# Patient Record
Sex: Male | Born: 1945 | Hispanic: No | Marital: Married | Smoking: Former smoker
Health system: Southern US, Community
[De-identification: ages and names within clinical notes are randomized; demographics above are authoritative.]

## PROBLEM LIST (undated history)

## (undated) DIAGNOSIS — I639 Cerebral infarction, unspecified: Secondary | ICD-10-CM

## (undated) DIAGNOSIS — I10 Essential (primary) hypertension: Secondary | ICD-10-CM

## (undated) DIAGNOSIS — K759 Inflammatory liver disease, unspecified: Secondary | ICD-10-CM

## (undated) DIAGNOSIS — K5900 Constipation, unspecified: Secondary | ICD-10-CM

## (undated) DIAGNOSIS — N186 End stage renal disease: Secondary | ICD-10-CM

## (undated) DIAGNOSIS — Z992 Dependence on renal dialysis: Secondary | ICD-10-CM

## (undated) DIAGNOSIS — I2510 Atherosclerotic heart disease of native coronary artery without angina pectoris: Secondary | ICD-10-CM

## (undated) DIAGNOSIS — F1010 Alcohol abuse, uncomplicated: Secondary | ICD-10-CM

## (undated) DIAGNOSIS — E7800 Pure hypercholesterolemia, unspecified: Secondary | ICD-10-CM

## (undated) DIAGNOSIS — K7460 Unspecified cirrhosis of liver: Secondary | ICD-10-CM

## (undated) HISTORY — PX: CATARACT EXTRACTION W/ INTRAOCULAR LENS IMPLANT: SHX1309

---

## 2003-07-22 ENCOUNTER — Ambulatory Visit: Admission: RE | Admit: 2003-07-22 | Discharge: 2003-07-22 | Payer: Self-pay | Admitting: *Deleted

## 2004-01-11 HISTORY — PX: AV FISTULA PLACEMENT: SHX1204

## 2004-03-12 ENCOUNTER — Encounter: Admission: RE | Admit: 2004-03-12 | Discharge: 2004-03-12 | Payer: Self-pay | Admitting: Nephrology

## 2004-03-24 ENCOUNTER — Ambulatory Visit (HOSPITAL_COMMUNITY): Admission: RE | Admit: 2004-03-24 | Discharge: 2004-03-24 | Payer: Self-pay | Admitting: Cardiology

## 2004-08-20 ENCOUNTER — Ambulatory Visit (HOSPITAL_COMMUNITY): Admission: RE | Admit: 2004-08-20 | Discharge: 2004-08-20 | Payer: Self-pay | Admitting: Vascular Surgery

## 2004-09-03 ENCOUNTER — Encounter (HOSPITAL_COMMUNITY): Admission: RE | Admit: 2004-09-03 | Discharge: 2004-12-02 | Payer: Self-pay | Admitting: Nephrology

## 2005-07-01 ENCOUNTER — Ambulatory Visit: Payer: Self-pay | Admitting: Cardiology

## 2005-07-25 ENCOUNTER — Ambulatory Visit: Payer: Self-pay | Admitting: Cardiology

## 2005-07-25 ENCOUNTER — Ambulatory Visit (HOSPITAL_COMMUNITY): Admission: RE | Admit: 2005-07-25 | Discharge: 2005-07-25 | Payer: Self-pay | Admitting: Cardiology

## 2005-08-12 ENCOUNTER — Ambulatory Visit: Payer: Self-pay | Admitting: Cardiology

## 2005-09-28 ENCOUNTER — Ambulatory Visit: Payer: Self-pay | Admitting: Gastroenterology

## 2005-10-03 ENCOUNTER — Ambulatory Visit (HOSPITAL_COMMUNITY): Admission: RE | Admit: 2005-10-03 | Discharge: 2005-10-03 | Payer: Self-pay | Admitting: Gastroenterology

## 2005-10-07 ENCOUNTER — Ambulatory Visit: Payer: Self-pay | Admitting: Gastroenterology

## 2006-05-01 ENCOUNTER — Ambulatory Visit: Payer: Self-pay

## 2006-05-01 ENCOUNTER — Ambulatory Visit: Payer: Self-pay | Admitting: Cardiology

## 2006-05-01 LAB — CONVERTED CEMR LAB
ALT: 22 units/L (ref 0–40)
AST: 24 units/L (ref 0–37)
Cholesterol: 179 mg/dL (ref 0–200)
Total Bilirubin: 0.8 mg/dL (ref 0.3–1.2)
Total CHOL/HDL Ratio: 5.9
VLDL: 44 mg/dL — ABNORMAL HIGH (ref 0–40)

## 2006-10-02 ENCOUNTER — Ambulatory Visit (HOSPITAL_COMMUNITY): Admission: RE | Admit: 2006-10-02 | Discharge: 2006-10-02 | Payer: Self-pay | Admitting: Nephrology

## 2006-11-27 ENCOUNTER — Ambulatory Visit (HOSPITAL_COMMUNITY): Admission: RE | Admit: 2006-11-27 | Discharge: 2006-11-27 | Payer: Self-pay | Admitting: Nephrology

## 2007-08-24 ENCOUNTER — Encounter: Admission: RE | Admit: 2007-08-24 | Discharge: 2007-08-24 | Payer: Self-pay | Admitting: Nephrology

## 2007-08-24 ENCOUNTER — Encounter: Payer: Self-pay | Admitting: Pulmonary Disease

## 2007-09-04 ENCOUNTER — Encounter: Payer: Self-pay | Admitting: Pulmonary Disease

## 2007-09-04 ENCOUNTER — Encounter: Admission: RE | Admit: 2007-09-04 | Discharge: 2007-09-04 | Payer: Self-pay | Admitting: Nephrology

## 2007-10-05 ENCOUNTER — Ambulatory Visit: Payer: Self-pay | Admitting: Pulmonary Disease

## 2007-10-05 DIAGNOSIS — I10 Essential (primary) hypertension: Secondary | ICD-10-CM

## 2007-10-05 DIAGNOSIS — J984 Other disorders of lung: Secondary | ICD-10-CM

## 2007-10-05 DIAGNOSIS — N19 Unspecified kidney failure: Secondary | ICD-10-CM | POA: Insufficient documentation

## 2007-10-05 DIAGNOSIS — N186 End stage renal disease: Secondary | ICD-10-CM

## 2007-10-05 DIAGNOSIS — E1122 Type 2 diabetes mellitus with diabetic chronic kidney disease: Secondary | ICD-10-CM

## 2007-10-05 DIAGNOSIS — I219 Acute myocardial infarction, unspecified: Secondary | ICD-10-CM | POA: Insufficient documentation

## 2008-10-01 DIAGNOSIS — I428 Other cardiomyopathies: Secondary | ICD-10-CM

## 2008-10-01 DIAGNOSIS — E785 Hyperlipidemia, unspecified: Secondary | ICD-10-CM | POA: Insufficient documentation

## 2008-10-01 DIAGNOSIS — I2510 Atherosclerotic heart disease of native coronary artery without angina pectoris: Secondary | ICD-10-CM | POA: Insufficient documentation

## 2008-10-01 DIAGNOSIS — B1710 Acute hepatitis C without hepatic coma: Secondary | ICD-10-CM | POA: Insufficient documentation

## 2008-10-01 DIAGNOSIS — I639 Cerebral infarction, unspecified: Secondary | ICD-10-CM

## 2008-10-06 ENCOUNTER — Ambulatory Visit (HOSPITAL_COMMUNITY): Admission: RE | Admit: 2008-10-06 | Discharge: 2008-10-06 | Payer: Self-pay | Admitting: Nephrology

## 2009-06-05 ENCOUNTER — Ambulatory Visit: Payer: Self-pay | Admitting: Pulmonary Disease

## 2009-06-10 ENCOUNTER — Ambulatory Visit: Payer: Self-pay | Admitting: Cardiology

## 2010-01-31 ENCOUNTER — Encounter: Payer: Self-pay | Admitting: Pulmonary Disease

## 2010-02-03 ENCOUNTER — Other Ambulatory Visit (HOSPITAL_COMMUNITY): Payer: Self-pay | Admitting: Internal Medicine

## 2010-02-03 ENCOUNTER — Other Ambulatory Visit (HOSPITAL_COMMUNITY): Payer: Self-pay | Admitting: Cardiology

## 2010-02-03 ENCOUNTER — Ambulatory Visit
Admission: RE | Admit: 2010-02-03 | Discharge: 2010-02-03 | Payer: Self-pay | Source: Home / Self Care | Attending: Vascular Surgery | Admitting: Vascular Surgery

## 2010-02-03 DIAGNOSIS — E049 Nontoxic goiter, unspecified: Secondary | ICD-10-CM

## 2010-02-03 DIAGNOSIS — R52 Pain, unspecified: Secondary | ICD-10-CM

## 2010-02-10 NOTE — Assessment & Plan Note (Signed)
Summary: rov for pulmonary nodules   Copy to:  Fayrene Fearing Deterding  CC:  Pt is here for a f/u appt to f/u on lung nodule.  Pt was last seen Sept 2009.  Pt denied sob, cough, tightness in chest, fever, and head congestion.  Marland Kitchen  History of Present Illness: the pt comes in today for f/u of his known pulmonary nodules. He was last seen in 2009 where a chest CT showed an 11 mm right lung density, and the decision was made to follow this with surveillance. He was scheduled for a four-month followup CT chest, however the patient no showed for the visit. Multiple attempts were made to contact the patient to reschedule, however they were unsuccessful. The patient was lost to followup, but now comes in today for further evaluation. He denies significant anorexia or weight loss, and has no significant cough.  He denies any significant change in his breathing.  Current Medications (verified): 1)  Plavix 75 Mg Tabs (Clopidogrel Bisulfate) .... Take 1 Tablet By Mouth Once A Day 2)  Lipitor 40 Mg Tabs (Atorvastatin Calcium) .... Take 1 Tablet By Mouth Once A Day 3)  Insulin 70/30 .... Use As Directed 4)  Coreg Cr 20 Mg Xr24h-Cap (Carvedilol Phosphate) .... Take 1 Tab By Mouth Each Morning and 1/2 Tab By Mouth At Bedtime 5)  Lisinopril 10 Mg Tabs (Lisinopril) .... Take 1 Tablet By Mouth Once A Day 6)  Rena-Vite  Tabs (B Complex-C-Folic Acid) .... Take 1 Tablet By Mouth Once A Day 7)  Calcium Acetate 667 Mg Caps (Calcium Acetate (Phos Binder)) .... Take 2 Tabs By Mouth With Each Meal and With Snacks 8)  Sensipar 30 Mg Tabs (Cinacalcet Hcl) .... Take 1 Tab By Mouth At Bedtime  Allergies (verified): No Known Drug Allergies  Review of Systems  The patient denies shortness of breath with activity, shortness of breath at rest, productive cough, non-productive cough, coughing up blood, chest pain, irregular heartbeats, acid heartburn, indigestion, loss of appetite, weight change, abdominal pain, difficulty swallowing,  sore throat, tooth/dental problems, headaches, nasal congestion/difficulty breathing through nose, sneezing, itching, ear ache, anxiety, depression, hand/feet swelling, joint stiffness or pain, rash, change in color of mucus, and fever.    Vital Signs:  Patient profile:   65 year old male Weight:      172 pounds O2 Sat:      99 % on Room air Temp:     97.7 degrees F oral Pulse rate:   72 / minute BP sitting:   124 / 66  (left arm) Cuff size:   regular  Vitals Entered By: Arman Filter LPN (Jun 05, 2009 10:19 AM)  O2 Flow:  Room air CC: Pt is here for a f/u appt to f/u on lung nodule.  Pt was last seen Sept 2009.  Pt denied sob, cough, tightness in chest, fever, head congestion.   Comments Medications reviewed with patient Arman Filter LPN  Jun 05, 2009 10:20 AM    Physical Exam  General:  wd male in nad Lungs:  clear Heart:  rrr Extremities:  mild edema, no cyanosis Neurologic:  alert and oriented, moves all 4.   Impression & Recommendations:  Problem # 1:  PULMONARY NODULE (ICD-518.89) the pt has known pulmonary nodules with tobacco abuse history.  He unfortunately has not had a f/u ct chest in almost 2 years.  He had a 4mos f/u scheduled, but no showed, and we were unable to contact him to arrange a followup.  He obviously needs a ct chest to check on the nodules in question.  I will call with results, and discuss further plans.  Medications Added to Medication List This Visit: 1)  Insulin 70/30  .... Use as directed 2)  Coreg Cr 20 Mg Xr24h-cap (Carvedilol phosphate) .... Take 1 tab by mouth each morning and 1/2 tab by mouth at bedtime 3)  Lisinopril 10 Mg Tabs (Lisinopril) .... Take 1 tablet by mouth once a day 4)  Rena-vite Tabs (B complex-c-folic acid) .... Take 1 tablet by mouth once a day 5)  Calcium Acetate 667 Mg Caps (Calcium acetate (phos binder)) .... Take 2 tabs by mouth with each meal and with snacks 6)  Sensipar 30 Mg Tabs (Cinacalcet hcl) .... Take 1 tab by  mouth at bedtime  Other Orders: Est. Patient Level III (04540) Radiology Referral (Radiology)  Patient Instructions: 1)  will set up for ct chest to evaluate your nodules.  I will call you with the results.   Immunization History:  Influenza Immunization History:    Influenza:  historical (01/11/2008)

## 2010-02-22 ENCOUNTER — Encounter (HOSPITAL_COMMUNITY): Payer: Self-pay

## 2010-03-23 ENCOUNTER — Encounter (HOSPITAL_COMMUNITY): Payer: Self-pay

## 2010-05-28 NOTE — Cardiovascular Report (Signed)
[name], [name]                 ACCOUNT NO.:  [id number]   MEDICAL RECORD NO.:  [id number]          PATIENT TYPE:  OIB   LOCATION:  2899                         FACILITY:  [facility]   PHYSICIAN:  Arturo Morton. Riley Kill, M.D. [facility] OF BIRTH:  11/08/1945   DATE OF PROCEDURE:  07/25/2005  DATE OF DISCHARGE:                              CARDIAC CATHETERIZATION   INDICATIONS:  The patient is a 65 year old gentleman with end-stage renal  disease on dialysis.  He is being considered for a transplant. An  echocardiogram in [place] suggested an EF of 45%. Previous Cardiolite here  showed an EF of 41%, with an inferior scar. Current study is done to assess  coronary anatomy.   PROCEDURES:  1.  Selective coronary arteriography.  2.  Left heart catheterization.   DESCRIPTION OF THE PROCEDURE:  The patient was brought to the cath lab and  prepped and draped in the usual fashion.  We used 4-French catheters.  We  did do an i-STAT which was 5.5, with the previous potassium of 5.9.  Using 4-  French catheters, we injected the coronaries.  The valve was crossed with  the right coronary catheter.  We elected not to do a  ventriculogram.  He  tolerated the procedure well without complication and was taken to the  holding area in satisfactory clinical condition.  I did make contact with  Dr. Annie Sable, who thought that dialysis tomorrow would be  satisfactory.   HEMODYNAMIC DATA:  1.  Central aortic pressure 157/69, mean 102.  2.  Left ventricular pressure 157/20.  3.  No gradient across the aortic valve.   ANGIOGRAPHIC DATA:  1.  On plain fluoroscopy, there was evidence of calcification of the      coronaries.  2.  The left main is free of critical disease.  3.  The left anterior descending artery has some luminal irregularities, but      noncritical stenosis.  There is a large diagonal branch with about 50%      narrowing proximally.  4.  The circumflex demonstrates a first marginal  without significant      narrowing, and then the circumflex is totally occluded.  There are      bridging collaterals to the distal vessel.  5.  The right coronary artery demonstrates 50% proximal narrowing.  The      vessel is then totally occluded after the takeoff of the RV branch.  The      distal vessel fills by collaterals.   CONCLUSIONS:  Coronary artery disease, with total occlusion of the  circumflex and right coronary arteries, and noncritical disease of the left  anterior descending artery.   DISPOSITION:  I have discussed the case with Dr. Jens Som. The patient is  currently asymptomatic.  At the present time, we would recommend medical  therapy.  The issue regarding candidacy for transplant is a more difficult  issue that will need to be discussed with Dr. Jens Som and also the  transplant surgical team.      Arturo Morton. Riley Kill, M.D. [facility]  Electronically Signed  TDS/MEDQ  D:  07/25/2005  T:  07/25/2005  Job:  [id number]   cc:   Cardiovascular Laboratory  Olga Millers, M.D. [facility]  Richard F. Caryn Section, M.D.

## 2010-05-28 NOTE — Assessment & Plan Note (Signed)
[facility] HEALTHCARE                            CARDIOLOGY OFFICE NOTE   Greg [name], Greg [name]                          MRN:          [id number]  DATE:05/01/2006                            DOB:          08-16-1945    Greg [name] is a gentleman who has a history of coronary disease based on  the cardiac catheterization on July 25, 2005.  We are treating him  medically.  Since I last saw him, he did travel to [place] for 4  months.  He apparently had a CVA while he was over there but those  records are not available.  He denies any dyspnea, chest pain,  palpitations or syncope.   MEDICATIONS:  1. PhosLo.  2. Insulin.  3. Nephro-Vite daily.  4. Toprol 12.5 mg p.o. daily.  5. Aspirin 325 mg p.o. daily.  6. Lipitor 40 mg p.o. daily.  7. Stool softener.  8. Plavix 75 mg p.o. daily.  9. Coreg CR 10 mg on Tuesdays, Thursday and Saturdays, which were his      dialysis days, and Coreg 20 mg CR on Mondays, Wednesdays, Fridays      and Sundays.   PHYSICAL EXAM TODAY:  Shows a blood pressure of 170/78 and his pulse is  66.  He weighs 168 pounds.  NECK:  Supple and there are bilateral carotid bruits.  CHEST:  Clear.  CARDIOVASCULAR EXAM:  Regular rate and rhythm.  EXTREMITIES:  No edema.   Electrocardiogram shows a sinus rhythm at a rate of 66.  There is left  ventricular hypertrophy and there is inferolateral T wave inversion.   DIAGNOSES:  1. Coronary artery disease - we will plan to continue with medical      therapy with his aspirin, Plavix, beta-blocker and Statin.  Note, I      will discontinue his Toprol as he is on both Toprol and Coreg.  2. Ischemic cardiomyopathy - He will continue with his beta-blocker.      Note, his blood pressure apparently dropped significantly on      dialysis and I am, therefore, hesitant to add an ACE inhibitor.  We      will continue to track this and add one in the future if tolerated.  3. Hypertension - His blood pressure is well  controlled on one reading      today.  However, this apparently is an issue at home, in particular      on dialysis days.  This will continue to be tracked and we will      adjust as indicated.  4. Diabetes mellitus - Per primary care.  5. Hyperlipidemia - We will check lipids and liver today and adjust      his Lipitor as indicated with a goal LDL of less than 70.  6. End-stage renal disease, dialysis dependent - Per Nephrology.  7. History of Hepatitis C.  8. Recent cerebrovascular accident - He has bilateral carotid bruits      and we will check carotid Dopplers.  We will see him back in 6  months.     Madolyn Frieze Greg Som, MD, [facility]  Electronically Signed    BSC/MedQ  DD: 05/01/2006  DT: 05/01/2006  Job #: [id number]

## 2010-05-28 NOTE — Assessment & Plan Note (Signed)
[facility] HEALTHCARE                           GASTROENTEROLOGY OFFICE NOTE   TREYSEAN, PETRUZZI                          MRN:          [id number]  DATE:09/28/2005                            DOB:          03-05-1945    REFERRING PHYSICIAN:  Wilber Bihari. Caryn Section, M.D.   REASON FOR REFERRAL:  Colorectal cancer screening.   HISTORY OF PRESENT ILLNESS:  Mr. Saraceno is a 65 year old male with end-stage  renal disease who has been on hemodialysis for about the past five months.  He is in the process of being referred to [facility] for  a possible kidney transplant.  Screening colonoscopy has been recommended  prior to that evaluation.  He has no history of gastrointestinal complaints  and specifically denies any change in bowel habits, diarrhea, constipation,  change in stool caliber, melena, or hematochezia.  There is no family  history of colon cancer, colon polyps, or inflammatory bowel disease.  Mr.  Darley states he obtained hepatitis C from a blood transfusion while he was  visiting in [place] in 2002.  He has not been evaluated by hepatologist for  treatment.  He has hemodialysis on Tuesday, Thursdays, and Saturdays.   PAST MEDICAL HISTORY:  1. Diabetes mellitus.  2. Hypertension.  3. Hyperlipidemia.  4. End-stage renal disease secondary to diabetes, on hemodialysis.  5. History of hepatitis C.  6. Coronary artery disease.   MEDICATIONS:  Listed on the chart, updated and reviewed.   MEDICATION ALLERGIES:  None known.   SOCIAL HISTORY:  Per the handwritten evaluation form.   REVIEW OF SYSTEMS:  Per the handwritten evaluation form.   PHYSICAL EXAMINATION:  GENERAL:  No acute distress.  Appears chronically  ill.  Height 5 feet 9 inches.  Weight 142 pounds.  VITAL SIGNS:  Blood pressure 90/56, pulse 56.  HEENT:  Anicteric sclerae.  Oropharynx clear.  CHEST:  Clear to auscultation bilaterally.  CARDIAC:  Regular rate and rhythm without  murmurs appreciated.  ABDOMEN:  Soft, nontender, nondistended.  Normoactive bowel sounds.  No  palpable organomegaly, masses, or hernias.  RECTAL:  Deferred at the time of colonoscopy.  EXTREMITIES:  Without clubbing, cyanosis or edema.  NEUROLOGIC:  Alert and oriented x3.  Grossly nonfocal.   ASSESSMENT/PLAN:  1. Average risk for colorectal cancer and colon polyps.  Insulin-dependent      diabetes mellitus and end-stage renal disease, on hemodialysis.  Risks,      benefits and alternatives to colonoscopy, possible biopsy and possible      polypectomy, were discussed with the patient, and he consents to      proceed. This will be scheduled electively.  2. History of hepatitis C:  I have recommended further evaluation with a      hepatologist, who specializes in treatment of hepatitis C.  This also      needs to be further discussed with his transplant surgeon at [facility].  Venita Lick. Russella Dar, MD, [facility]   MTS/MedQ  DD:  09/28/2005  DT:  09/29/2005  Job #:  [id number]   cc:   Wilber Bihari. Caryn Section, M.D.

## 2010-05-28 NOTE — Assessment & Plan Note (Signed)
[facility] HEALTHCARE                              CARDIOLOGY OFFICE NOTE   LOYCE, KLASEN                          MRN:          [id number]  DATE:08/12/2005                            DOB:          August 12, 1945    HISTORY:  Mr. Park Breed returns for followup today.  Please refer to my note of  July 01, 2005, for details.  I was asked to see him for possible evaluation  prior to consideration of renal transplant.  At that time we noted that he  had a previous abnormal nuclear study with an ejection fraction of 41% and a  prior inferior lateral infarction with mild ischemia in the septum.  Her  also had multiple risk factors.  We scheduled him for a cardiac  catheterization which was performed on July 25, 2005.  He had no disease in  his left main.  There was a diagonal with a 50% lesion but there was no  other disease in the LAD.  The circumflex gave rise to a first marginal  without significant narrowing, but then the circumflex was totally occluded,  but it did fill with bridging collaterals to the distal vessel.  The right  coronary artery had a 50% proximal lesion and then was totally occluded, but  also filled via the collaterals.  Since that time he has not had chest pain  or shortness of breath, and there is no pedal edema.   MEDICATIONS:  1.  PhosLo.  2.  Insulin.  3.  Nephro-Vite.  4.  Epogen.  5.  Coreg 20 mg p.o. daily.  6.  Aspirin 81 mg p.o. daily.  7.  Toprol 12.5 mg p.o. daily.   PHYSICAL EXAMINATION:  VITAL SIGNS:  Blood pressure 130/78, pulse 60.  NECK:  Supple, no bruits.  CHEST:  Clear.  CARDIOVASCULAR:  A regular rate and rhythm.  His right groin shows no  hematoma, no bruits.   Electrocardiogram shows a sinus rhythm and there is lateral T-wave inversion  which is unchanged.  There is also evidence of a prior inferior infarction.   DIAGNOSES:  1.  Coronary artery disease.  2.  History of mild reduction in left ventricular  function.  3.  History of hepatitis-C.  4.  Diabetes mellitus.  5.  Hypertension.  6.  Hyperlipidemia.  7.  End-stage renal disease, dialysis dependent.   PLAN:  Mr. Park Breed is doing well from the symptomatic standpoint.  We are  planning medical therapy for his coronary artery disease.  Note that  previous nuclear study performed in March 2006, did show an infarction and  only mild ischemia.  He is on two beta blockers and I have asked him to  discontinue his Toprol.  We will continue with his aspirin and Coreg and I  may increase his Coreg in the future.  Will check liver function tests  today.  If they are not significantly elevated from his hepatitis, then we  will add Zocor 40 mg p.o. q.h.s. and follow his lipids and liver closely.  If possible, I would like his  LDL to be less than 70, given his documented  coronary artery disease.  The issue of whether he will be a candidate for  renal transplant will need to be discussed with the nephrologist.  He does  have significant coronary disease and would be at higher risk than ordinary.  Apparently he has also been asked to see a gastroenterologist for his  history of hepatitis, and we will arrange that.  We will also consider  adding an ACE inhibitor in the future if his blood pressure will allow on  his dialysis days.  I will see him back in six months.                              Madolyn Frieze Jens Som, MD, [facility]    BSC/MedQ  DD:  08/12/2005  DT:  08/12/2005  Job #:  [id number]   cc:   Wilber Bihari. Caryn Section, MD

## 2010-05-28 NOTE — Op Note (Signed)
NAMEEVERHETT, BOZARD                 ACCOUNT NO.:  [id number]   MEDICAL RECORD NO.:  [id number]          PATIENT TYPE:  AMB   LOCATION:                               FACILITY:  [facility]   PHYSICIAN:  Larina Earthly, M.D.    DATE OF BIRTH:  1945-05-15   DATE OF PROCEDURE:  08/20/2004  DATE OF DISCHARGE:                                 OPERATIVE REPORT   PREOPERATIVE DIAGNOSIS:  Chronic renal insufficiency.   POSTOPERATIVE DIAGNOSIS:  Chronic renal insufficiency.   PROCEDURE:  Left upper arm arteriovenous fistula creation.   SURGEON:  Larina Earthly, M.D.   ASSISTANT:  Nurse.   ANESTHESIA:  MAC.   COMPLICATIONS:  None.   DISPOSITION:  To the recovery room stable.   DESCRIPTION OF PROCEDURE:  The patient was taken to the operating room and  placed in the supine position where the area of the left arm was prepped and  draped in the usual sterile fashion.  Using local anesthesia, an incision  was made over the antecubital space and carried down to isolate the cephalic  vein which was of good caliber and the brachial artery which was also of  good caliber.  The cephalic vein was mobilized proximally and distally.  The  vein was ligated distally and divided.  The vein was gently dilated with  heparinized saline and was found to be of good caliber.  The brachial artery  was occluded proximally and distally and was opened with a #11 blade and  sewn on with #3 Pott scissors.  The vein was slightly spatulated and sewn  end-to-side to the artery with a running #6-0 Prolene suture.  Clamps were  removed and an excellent thrill was noted.  The wounds were irrigated  saline.  Hemostasis with electrocautery.  The wounds were closed with #3-0  Vicryl in the subcutaneous and subcuticular tissue.  Benzoin and Steri-  Strips were applied.      Larina Earthly, M.D.  Electronically Signed     TFE/MEDQ  D:  08/20/2004  T:  08/20/2004  Job:  [id number]

## 2010-06-09 ENCOUNTER — Ambulatory Visit (INDEPENDENT_AMBULATORY_CARE_PROVIDER_SITE_OTHER): Payer: Medicaid Other | Admitting: Vascular Surgery

## 2010-06-09 DIAGNOSIS — I729 Aneurysm of unspecified site: Secondary | ICD-10-CM

## 2010-06-10 NOTE — Assessment & Plan Note (Signed)
OFFICE VISIT  JAEDIN, REGINA DOB:  [birth year]                                       06/09/2010 CHART#:[id number]  I saw the patient in the office today to evaluate his left upper arm AV fistula which was placed in 2006.  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  He was sent over to have this evaluated.  He dialyzes at [facility] Tuesdays, Thursdays and Saturdays.  He states that he has had this wound for approximately 3 weeks.  He has had no problems with bleeding from the fistula.  He has had no recent uremic symptoms.  Specifically, he denies nausea, vomiting, anorexia, fatigue, palpitations, shortness of breath.  PAST MEDICAL HISTORY:  Significant for stage V chronic kidney disease, diabetes, hypertension, and a previous history of myocardial infarction.  SOCIAL HISTORY:  He is married.  He quit tobacco in 2004.  FAMILY HISTORY:  There is no history of premature cardiovascular disease.  REVIEW OF SYSTEMS:  GENERAL:  He has had no recent weight loss, weight gain or problems with his appetite. CARDIOVASCULAR:  He has had no chest pain, chest pressure, palpitations or arrhythmias.  He has had no history of stroke or TIA.  He has had no history of DVT or phlebitis. GI, NEUROLOGIC, HEMATOLOGIC, GU, ENT, MUSCULOSKELETAL, PSYCHIATRIC: Unremarkable and is documented on the medical history form in his chart.  PHYSICAL EXAMINATION:  General:  This is a pleasant 65 year old gentleman who appears his stated age.  Blood pressure is 117/55, heart rate is 67, temperature is 98.2.  HEENT:  Unremarkable.  Lungs:  Clear bilaterally to auscultation without rales, rhonchi or wheezing. Cardiovascular Exam:  I do not detect any carotid bruits.  He has a regular rate and rhythm.  Abdomen:  Soft and nontender with normal pitched bowel sounds.   Musculoskeletal Exam:  There are no major deformities or cyanosis.  Neurologic Exam:  There is no focal weakness or paresthesias.  Extremities:  The left upper arm AV fistula is pulsatile.  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  I have reviewed his records from Dr. Deterding's office.  The patient also has a history of iron deficiency anemia and secondary hyperparathyroidism.  In addition, he has hepatitis C.  Of note, he had a 2D echo in April 2007 in [place] which showed an ejection fraction of 45% with aortic sclerosis.  Given the small eschar on the aneurysm, I think really the only way to try to salvage the fistula and address this would be a local incision and trying to possibly plicate the aneurysmal segment of vein and close the skin over this, if at all possible.  This will be tricky given that the skin is compromised here.  I am concerned that the fistula is pulsatile, and therefore, before proceeding with revision of his fistula, which I have scheduled for 06/18/10, I would like to perform a fistulogram to be sure there is no proximal central venous stenosis which needs to be addressed.  This has been scheduled for 06/14/10.  We have discussed the procedure and potential complications, and he is agreeable to proceed.  Of note, he dialyzes Tuesdays, Thursdays and Saturdays, so we are working around his dialysis schedule.    Di Kindle. Edilia Bo, M.D. Electronically Signed  CSD/MEDQ  D:  06/09/2010  T:  06/10/2010  Job:  [id number]  cc:   [facility]

## 2010-06-14 ENCOUNTER — Ambulatory Visit (HOSPITAL_COMMUNITY)
Admission: RE | Admit: 2010-06-14 | Discharge: 2010-06-14 | Disposition: A | Discharge status: Home / Self Care | Payer: Medicaid Other | Source: Ambulatory Visit | Attending: Vascular Surgery | Admitting: Vascular Surgery

## 2010-06-14 DIAGNOSIS — I120 Hypertensive chronic kidney disease with stage 5 chronic kidney disease or end stage renal disease: Secondary | ICD-10-CM

## 2010-06-14 DIAGNOSIS — T82898A Other specified complication of vascular prosthetic devices, implants and grafts, initial encounter: Secondary | ICD-10-CM

## 2010-06-14 DIAGNOSIS — Y832 Surgical operation with anastomosis, bypass or graft as the cause of abnormal reaction of the patient, or of later complication, without mention of misadventure at the time of the procedure: Secondary | ICD-10-CM | POA: Insufficient documentation

## 2010-06-14 DIAGNOSIS — D509 Iron deficiency anemia, unspecified: Secondary | ICD-10-CM | POA: Insufficient documentation

## 2010-06-14 DIAGNOSIS — N186 End stage renal disease: Secondary | ICD-10-CM | POA: Insufficient documentation

## 2010-06-14 DIAGNOSIS — B1920 Unspecified viral hepatitis C without hepatic coma: Secondary | ICD-10-CM | POA: Insufficient documentation

## 2010-06-14 DIAGNOSIS — I252 Old myocardial infarction: Secondary | ICD-10-CM | POA: Insufficient documentation

## 2010-06-14 DIAGNOSIS — N2581 Secondary hyperparathyroidism of renal origin: Secondary | ICD-10-CM | POA: Insufficient documentation

## 2010-06-14 DIAGNOSIS — E119 Type 2 diabetes mellitus without complications: Secondary | ICD-10-CM | POA: Insufficient documentation

## 2010-06-14 LAB — POCT I-STAT, CHEM 8
BUN: 34 mg/dL — ABNORMAL HIGH (ref 6–23)
Calcium, Ion: 1.08 mmol/L — ABNORMAL LOW (ref 1.12–1.32)
Chloride: 103 mEq/L (ref 96–112)
Creatinine, Ser: 6.2 mg/dL — ABNORMAL HIGH (ref 0.4–1.5)
Glucose, Bld: 100 mg/dL — ABNORMAL HIGH (ref 70–99)
HCT: 39 % (ref 39.0–52.0)

## 2010-06-14 LAB — GLUCOSE, CAPILLARY: Glucose-Capillary: 104 mg/dL — ABNORMAL HIGH (ref 70–99)

## 2010-06-17 NOTE — Op Note (Signed)
  [name], Greg [name]                 ACCOUNT NO.:  [id number]  MEDICAL RECORD NO.:  [id number]  LOCATION:                                 FACILITY:  PHYSICIAN:  Di Kindle. Edilia Bo, M.D.DATE OF BIRTH:  1945-05-12  DATE OF PROCEDURE: DATE OF DISCHARGE:                              OPERATIVE REPORT   PREOPERATIVE DIAGNOSIS:  Eschar of left arteriovenous fistula.  POSTOPERATIVE DIAGNOSIS:  Eschar of left arteriovenous fistula.  PROCEDURE: 1. Ultrasound-guided access to the left AV fistula. 2. Fistulogram. 3. PTA of the cephalic vein with a 5-mm x 2-cm balloon and then a 6-mm     x 2-cm balloon in 2areas.  SURGEON:  Di Kindle. Edilia Bo, MD.  ANESTHESIA:  Local with sedation.  TECHNIQUE:  The patient was taken to the PV lab and the left upper extremity was prepped and draped in the usual sterile fashion.  Under ultrasound guidance after the skin was anesthetized, the left AV fistula was cannulated with a micropuncture set and the micropuncture sheath introduced.  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  I elected to balloon these 2 areas.  The micropuncture sheath was exchanged for a short 5-French sheath.  The patient was then heparinized with 3000 units of IV heparin.  ACT was 217.  Next, a 5-mm x 2-cm balloon was selected and this was positioned across the cephalic vein stenosis just beyond the entrance into the subclavian vein, this was ballooned to 10 atmospheres.  Balloon appeared somewhat small, although for the size of the vein.  I also ballooned the area in the mid cephalic vein above the aneurysm again the balloon appeared somewhat small for the size of the vein here.  I had elected to use a larger balloon, I selected a 6-mm x 2-cm balloon and  again these 2 areas were ballooned at 10 atmospheres.  Completion film showed some mild improvement in the mid cephalic vein, it was difficult to determine if there was significant change in the distal cephalic vein.  I was reluctant to use a larger balloon and potentially risk injuring the fistula.  At the completion, he did require labetalol and hydralazine for his blood pressure.  A stitch was placed with a 4-0 Monocryl for hemostasis and pressure was held for 5 minutes.  No immediate complications were noted.     Di Kindle. Edilia Bo, M.D.     CSD/MEDQ  D:  06/14/2010  T:  06/15/2010  Job:  [id number]  cc:   [facility]  Electronically Signed by Waverly Ferrari M.D. on 06/17/2010 03:16:07 PM

## 2010-06-18 ENCOUNTER — Ambulatory Visit (HOSPITAL_COMMUNITY): Admission: RE | Admit: 2010-06-18 | Payer: Medicaid Other | Source: Ambulatory Visit | Admitting: Vascular Surgery

## 2010-07-21 ENCOUNTER — Ambulatory Visit: Payer: Medicaid Other | Admitting: Vascular Surgery

## 2010-08-11 ENCOUNTER — Inpatient Hospital Stay (HOSPITAL_COMMUNITY)
Admission: EM | Admit: 2010-08-11 | Discharge: 2010-08-13 | DRG: 064 | Disposition: A | Discharge status: Rehabilitation Facility | Payer: Medicaid Other | Attending: Neurology | Admitting: Neurology

## 2010-08-11 ENCOUNTER — Emergency Department (HOSPITAL_COMMUNITY): Payer: Medicaid Other

## 2010-08-11 DIAGNOSIS — D649 Anemia, unspecified: Secondary | ICD-10-CM | POA: Diagnosis present

## 2010-08-11 DIAGNOSIS — E1142 Type 2 diabetes mellitus with diabetic polyneuropathy: Secondary | ICD-10-CM | POA: Diagnosis present

## 2010-08-11 DIAGNOSIS — N2581 Secondary hyperparathyroidism of renal origin: Secondary | ICD-10-CM | POA: Diagnosis present

## 2010-08-11 DIAGNOSIS — E1149 Type 2 diabetes mellitus with other diabetic neurological complication: Secondary | ICD-10-CM | POA: Diagnosis present

## 2010-08-11 DIAGNOSIS — N186 End stage renal disease: Secondary | ICD-10-CM | POA: Diagnosis present

## 2010-08-11 DIAGNOSIS — B1920 Unspecified viral hepatitis C without hepatic coma: Secondary | ICD-10-CM | POA: Diagnosis present

## 2010-08-11 DIAGNOSIS — R209 Unspecified disturbances of skin sensation: Secondary | ICD-10-CM | POA: Diagnosis present

## 2010-08-11 DIAGNOSIS — Z9849 Cataract extraction status, unspecified eye: Secondary | ICD-10-CM

## 2010-08-11 DIAGNOSIS — I428 Other cardiomyopathies: Secondary | ICD-10-CM | POA: Diagnosis present

## 2010-08-11 DIAGNOSIS — E785 Hyperlipidemia, unspecified: Secondary | ICD-10-CM | POA: Diagnosis present

## 2010-08-11 DIAGNOSIS — Z794 Long term (current) use of insulin: Secondary | ICD-10-CM

## 2010-08-11 DIAGNOSIS — R471 Dysarthria and anarthria: Secondary | ICD-10-CM | POA: Diagnosis present

## 2010-08-11 DIAGNOSIS — E669 Obesity, unspecified: Secondary | ICD-10-CM | POA: Diagnosis present

## 2010-08-11 DIAGNOSIS — K219 Gastro-esophageal reflux disease without esophagitis: Secondary | ICD-10-CM | POA: Diagnosis present

## 2010-08-11 DIAGNOSIS — Z79899 Other long term (current) drug therapy: Secondary | ICD-10-CM

## 2010-08-11 DIAGNOSIS — I252 Old myocardial infarction: Secondary | ICD-10-CM

## 2010-08-11 DIAGNOSIS — Z8673 Personal history of transient ischemic attack (TIA), and cerebral infarction without residual deficits: Secondary | ICD-10-CM

## 2010-08-11 DIAGNOSIS — I120 Hypertensive chronic kidney disease with stage 5 chronic kidney disease or end stage renal disease: Secondary | ICD-10-CM | POA: Diagnosis present

## 2010-08-11 DIAGNOSIS — Z7982 Long term (current) use of aspirin: Secondary | ICD-10-CM

## 2010-08-11 DIAGNOSIS — I6350 Cerebral infarction due to unspecified occlusion or stenosis of unspecified cerebral artery: Principal | ICD-10-CM | POA: Diagnosis present

## 2010-08-11 DIAGNOSIS — R269 Unspecified abnormalities of gait and mobility: Secondary | ICD-10-CM | POA: Diagnosis present

## 2010-08-11 LAB — COMPREHENSIVE METABOLIC PANEL
ALT: 19 U/L (ref 0–53)
Alkaline Phosphatase: 62 U/L (ref 39–117)
BUN: 31 mg/dL — ABNORMAL HIGH (ref 6–23)
CO2: 33 mEq/L — ABNORMAL HIGH (ref 19–32)
Chloride: 92 mEq/L — ABNORMAL LOW (ref 96–112)
GFR calc Af Amer: 13 mL/min — ABNORMAL LOW (ref >60)
Glucose, Bld: 168 mg/dL — ABNORMAL HIGH (ref 70–99)
Potassium: 5 mEq/L (ref 3.5–5.1)
Sodium: 135 mEq/L (ref 135–145)
Total Bilirubin: 0.4 mg/dL (ref 0.3–1.2)

## 2010-08-11 LAB — PROTIME-INR: Prothrombin Time: 14.2 seconds (ref 11.6–15.2)

## 2010-08-11 LAB — POCT I-STAT, CHEM 8
BUN: 31 mg/dL — ABNORMAL HIGH (ref 6–23)
Creatinine, Ser: 5.8 mg/dL — ABNORMAL HIGH (ref 0.50–1.35)
Glucose, Bld: 170 mg/dL — ABNORMAL HIGH (ref 70–99)
Hemoglobin: 12.9 g/dL — ABNORMAL LOW (ref 13.0–17.0)
Potassium: 5 mEq/L (ref 3.5–5.1)

## 2010-08-11 LAB — CBC
HCT: 36.8 % — ABNORMAL LOW (ref 39.0–52.0)
MCH: 28.5 pg (ref 26.0–34.0)
MCHC: 33.4 g/dL (ref 30.0–36.0)
MCV: 85.2 fL (ref 78.0–100.0)
RDW: 14 % (ref 11.5–15.5)

## 2010-08-11 LAB — DIFFERENTIAL
Basophils Absolute: 0 10*3/uL (ref 0.0–0.1)
Eosinophils Relative: 1 % (ref 0–5)
Lymphocytes Relative: 23 % (ref 12–46)
Monocytes Absolute: 0.7 10*3/uL (ref 0.1–1.0)
Monocytes Relative: 10 % (ref 3–12)

## 2010-08-11 LAB — GLUCOSE, CAPILLARY

## 2010-08-11 LAB — CK TOTAL AND CKMB (NOT AT ARMC): CK, MB: 2.5 ng/mL (ref 0.3–4.0)

## 2010-08-12 ENCOUNTER — Inpatient Hospital Stay (HOSPITAL_COMMUNITY): Payer: Medicaid Other

## 2010-08-12 DIAGNOSIS — G8110 Spastic hemiplegia affecting unspecified side: Secondary | ICD-10-CM

## 2010-08-12 DIAGNOSIS — I69921 Dysphasia following unspecified cerebrovascular disease: Secondary | ICD-10-CM

## 2010-08-12 DIAGNOSIS — I6330 Cerebral infarction due to thrombosis of unspecified cerebral artery: Secondary | ICD-10-CM

## 2010-08-12 LAB — RENAL FUNCTION PANEL
CO2: 27 mEq/L (ref 19–32)
Calcium: 9.6 mg/dL (ref 8.4–10.5)
GFR calc Af Amer: 10 mL/min — ABNORMAL LOW (ref >60)
GFR calc non Af Amer: 8 mL/min — ABNORMAL LOW (ref >60)
Phosphorus: 5.1 mg/dL — ABNORMAL HIGH (ref 2.3–4.6)
Sodium: 133 mEq/L — ABNORMAL LOW (ref 135–145)

## 2010-08-12 LAB — CBC
MCH: 29.6 pg (ref 26.0–34.0)
Platelets: 105 10*3/uL — ABNORMAL LOW (ref 150–400)
RBC: 3.85 MIL/uL — ABNORMAL LOW (ref 4.22–5.81)

## 2010-08-12 LAB — LIPID PANEL
HDL: 38 mg/dL — ABNORMAL LOW (ref >39)
LDL Cholesterol: 135 mg/dL — ABNORMAL HIGH (ref 0–99)
Total CHOL/HDL Ratio: 5.3 RATIO
Triglycerides: 146 mg/dL (ref <150)

## 2010-08-12 LAB — GLUCOSE, CAPILLARY: Glucose-Capillary: 101 mg/dL — ABNORMAL HIGH (ref 70–99)

## 2010-08-13 ENCOUNTER — Inpatient Hospital Stay (HOSPITAL_COMMUNITY)
Admission: RE | Admit: 2010-08-13 | Discharge: 2010-08-26 | DRG: 945 | Disposition: A | Discharge status: Home Health | Payer: Medicaid Other | Source: Other Acute Inpatient Hospital | Attending: Physical Medicine & Rehabilitation | Admitting: Physical Medicine & Rehabilitation

## 2010-08-13 DIAGNOSIS — N186 End stage renal disease: Secondary | ICD-10-CM

## 2010-08-13 DIAGNOSIS — I6350 Cerebral infarction due to unspecified occlusion or stenosis of unspecified cerebral artery: Secondary | ICD-10-CM | POA: Diagnosis present

## 2010-08-13 DIAGNOSIS — K5900 Constipation, unspecified: Secondary | ICD-10-CM | POA: Diagnosis present

## 2010-08-13 DIAGNOSIS — I10 Essential (primary) hypertension: Secondary | ICD-10-CM

## 2010-08-13 DIAGNOSIS — E785 Hyperlipidemia, unspecified: Secondary | ICD-10-CM | POA: Diagnosis present

## 2010-08-13 DIAGNOSIS — Z87891 Personal history of nicotine dependence: Secondary | ICD-10-CM

## 2010-08-13 DIAGNOSIS — I059 Rheumatic mitral valve disease, unspecified: Secondary | ICD-10-CM | POA: Diagnosis present

## 2010-08-13 DIAGNOSIS — E119 Type 2 diabetes mellitus without complications: Secondary | ICD-10-CM | POA: Diagnosis present

## 2010-08-13 DIAGNOSIS — R471 Dysarthria and anarthria: Secondary | ICD-10-CM | POA: Diagnosis present

## 2010-08-13 DIAGNOSIS — I120 Hypertensive chronic kidney disease with stage 5 chronic kidney disease or end stage renal disease: Secondary | ICD-10-CM | POA: Diagnosis present

## 2010-08-13 DIAGNOSIS — Z7902 Long term (current) use of antithrombotics/antiplatelets: Secondary | ICD-10-CM

## 2010-08-13 DIAGNOSIS — I252 Old myocardial infarction: Secondary | ICD-10-CM

## 2010-08-13 DIAGNOSIS — G8190 Hemiplegia, unspecified affecting unspecified side: Secondary | ICD-10-CM | POA: Diagnosis present

## 2010-08-13 DIAGNOSIS — I951 Orthostatic hypotension: Secondary | ICD-10-CM | POA: Diagnosis not present

## 2010-08-13 DIAGNOSIS — R4789 Other speech disturbances: Secondary | ICD-10-CM | POA: Diagnosis present

## 2010-08-13 DIAGNOSIS — I2510 Atherosclerotic heart disease of native coronary artery without angina pectoris: Secondary | ICD-10-CM

## 2010-08-13 DIAGNOSIS — I6330 Cerebral infarction due to thrombosis of unspecified cerebral artery: Secondary | ICD-10-CM

## 2010-08-13 DIAGNOSIS — D649 Anemia, unspecified: Secondary | ICD-10-CM | POA: Diagnosis present

## 2010-08-13 DIAGNOSIS — B1920 Unspecified viral hepatitis C without hepatic coma: Secondary | ICD-10-CM | POA: Diagnosis present

## 2010-08-13 DIAGNOSIS — N2581 Secondary hyperparathyroidism of renal origin: Secondary | ICD-10-CM | POA: Diagnosis present

## 2010-08-13 DIAGNOSIS — E669 Obesity, unspecified: Secondary | ICD-10-CM | POA: Diagnosis present

## 2010-08-13 DIAGNOSIS — Z992 Dependence on renal dialysis: Secondary | ICD-10-CM

## 2010-08-13 DIAGNOSIS — G609 Hereditary and idiopathic neuropathy, unspecified: Secondary | ICD-10-CM | POA: Diagnosis present

## 2010-08-13 DIAGNOSIS — Z5189 Encounter for other specified aftercare: Principal | ICD-10-CM

## 2010-08-13 LAB — GLUCOSE, CAPILLARY
Glucose-Capillary: 147 mg/dL — ABNORMAL HIGH (ref 70–99)
Glucose-Capillary: 131 mg/dL — ABNORMAL HIGH (ref 70–99)

## 2010-08-14 ENCOUNTER — Inpatient Hospital Stay (HOSPITAL_COMMUNITY): Payer: Medicaid Other

## 2010-08-14 DIAGNOSIS — N186 End stage renal disease: Secondary | ICD-10-CM

## 2010-08-14 LAB — CBC
Hemoglobin: 11.3 g/dL — ABNORMAL LOW (ref 13.0–17.0)
MCH: 28.5 pg (ref 26.0–34.0)
MCHC: 33.9 g/dL (ref 30.0–36.0)
MCV: 83.9 fL (ref 78.0–100.0)
Platelets: 105 10*3/uL — ABNORMAL LOW (ref 150–400)

## 2010-08-14 LAB — RENAL FUNCTION PANEL
Calcium: 9.6 mg/dL (ref 8.4–10.5)
Creatinine, Ser: 5.95 mg/dL — ABNORMAL HIGH (ref 0.50–1.35)
GFR calc Af Amer: 12 mL/min — ABNORMAL LOW (ref >60)
Glucose, Bld: 171 mg/dL — ABNORMAL HIGH (ref 70–99)
Phosphorus: 4.6 mg/dL (ref 2.3–4.6)
Sodium: 133 mEq/L — ABNORMAL LOW (ref 135–145)

## 2010-08-14 LAB — GLUCOSE, CAPILLARY
Glucose-Capillary: 104 mg/dL — ABNORMAL HIGH (ref 70–99)
Glucose-Capillary: 154 mg/dL — ABNORMAL HIGH (ref 70–99)

## 2010-08-15 LAB — GLUCOSE, CAPILLARY
Glucose-Capillary: 154 mg/dL — ABNORMAL HIGH (ref 70–99)
Glucose-Capillary: 158 mg/dL — ABNORMAL HIGH (ref 70–99)
Glucose-Capillary: 220 mg/dL — ABNORMAL HIGH (ref 70–99)

## 2010-08-16 ENCOUNTER — Inpatient Hospital Stay (HOSPITAL_COMMUNITY): Payer: Medicaid Other

## 2010-08-16 DIAGNOSIS — I10 Essential (primary) hypertension: Secondary | ICD-10-CM

## 2010-08-16 DIAGNOSIS — I2510 Atherosclerotic heart disease of native coronary artery without angina pectoris: Secondary | ICD-10-CM

## 2010-08-16 DIAGNOSIS — N186 End stage renal disease: Secondary | ICD-10-CM

## 2010-08-16 DIAGNOSIS — I6330 Cerebral infarction due to thrombosis of unspecified cerebral artery: Secondary | ICD-10-CM

## 2010-08-16 LAB — GLUCOSE, CAPILLARY
Glucose-Capillary: 103 mg/dL — ABNORMAL HIGH (ref 70–99)
Glucose-Capillary: 178 mg/dL — ABNORMAL HIGH (ref 70–99)
Glucose-Capillary: 108 mg/dL — ABNORMAL HIGH (ref 70–99)

## 2010-08-17 ENCOUNTER — Inpatient Hospital Stay (HOSPITAL_COMMUNITY): Payer: Medicaid Other

## 2010-08-17 LAB — RENAL FUNCTION PANEL
Albumin: 3 g/dL — ABNORMAL LOW (ref 3.5–5.2)
BUN: 53 mg/dL — ABNORMAL HIGH (ref 6–23)
CO2: 25 mEq/L (ref 19–32)
Chloride: 91 mEq/L — ABNORMAL LOW (ref 96–112)
Creatinine, Ser: 6.49 mg/dL — ABNORMAL HIGH (ref 0.50–1.35)
Glucose, Bld: 131 mg/dL — ABNORMAL HIGH (ref 70–99)
Potassium: 5.7 mEq/L — ABNORMAL HIGH (ref 3.5–5.1)

## 2010-08-17 LAB — GLUCOSE, CAPILLARY
Glucose-Capillary: 104 mg/dL — ABNORMAL HIGH (ref 70–99)
Glucose-Capillary: 84 mg/dL (ref 70–99)

## 2010-08-17 LAB — CBC
HCT: 33.2 % — ABNORMAL LOW (ref 39.0–52.0)
Hemoglobin: 11.5 g/dL — ABNORMAL LOW (ref 13.0–17.0)
MCV: 83.4 fL (ref 78.0–100.0)
RBC: 3.98 MIL/uL — ABNORMAL LOW (ref 4.22–5.81)
RDW: 13.6 % (ref 11.5–15.5)
WBC: 10.5 10*3/uL (ref 4.0–10.5)

## 2010-08-18 DIAGNOSIS — N186 End stage renal disease: Secondary | ICD-10-CM

## 2010-08-18 DIAGNOSIS — I2510 Atherosclerotic heart disease of native coronary artery without angina pectoris: Secondary | ICD-10-CM

## 2010-08-18 DIAGNOSIS — I10 Essential (primary) hypertension: Secondary | ICD-10-CM

## 2010-08-18 DIAGNOSIS — I6330 Cerebral infarction due to thrombosis of unspecified cerebral artery: Secondary | ICD-10-CM

## 2010-08-18 LAB — GLUCOSE, CAPILLARY
Glucose-Capillary: 123 mg/dL — ABNORMAL HIGH (ref 70–99)
Glucose-Capillary: 101 mg/dL — ABNORMAL HIGH (ref 70–99)

## 2010-08-19 ENCOUNTER — Inpatient Hospital Stay (HOSPITAL_COMMUNITY): Payer: Medicaid Other

## 2010-08-19 LAB — RENAL FUNCTION PANEL
Calcium: 9.1 mg/dL (ref 8.4–10.5)
GFR calc Af Amer: 11 mL/min — ABNORMAL LOW (ref >60)
Glucose, Bld: 165 mg/dL — ABNORMAL HIGH (ref 70–99)
Phosphorus: 3.5 mg/dL (ref 2.3–4.6)
Sodium: 135 mEq/L (ref 135–145)

## 2010-08-19 LAB — CBC
HCT: 30.3 % — ABNORMAL LOW (ref 39.0–52.0)
MCH: 28.6 pg (ref 26.0–34.0)
MCHC: 33.7 g/dL (ref 30.0–36.0)
MCV: 84.9 fL (ref 78.0–100.0)
RDW: 13.8 % (ref 11.5–15.5)

## 2010-08-19 LAB — IRON AND TIBC: Iron: 53 ug/dL (ref 42–135)

## 2010-08-19 LAB — GLUCOSE, CAPILLARY: Glucose-Capillary: 103 mg/dL — ABNORMAL HIGH (ref 70–99)

## 2010-08-19 NOTE — Discharge Summary (Signed)
Greg, [name]                 ACCOUNT NO.:  [id number]  MEDICAL RECORD NO.:  [id number]  LOCATION:  3009                         FACILITY:  [facility]  PHYSICIAN:  Jaramie Bastos P. Pearlean Brownie, MD    DATE OF BIRTH:  12-Mar-1945  DATE OF ADMISSION:  08/11/2010 DATE OF DISCHARGE:  08/13/2010                              DISCHARGE SUMMARY   DIAGNOSES AT TIME OF DISCHARGE: 1. 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 2. Remote lacune due to extensive small vessel disease. 3. 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 4. Diabetes. 5. Dyslipidemia. 6. Hypertension. 7. Obesity. 8. Gait disorder. 9. Diabetic peripheral neuropathy. 10.Myocardial infarction in the past. 11.Hepatitis C. 12.Bilateral cataract surgery with lens implants. 13.End-stage renal disease, on hemodialysis Tuesday, Thursday, and     Saturday.  MEDICINES AT TIME OF DISCHARGE: 1. Lipitor 60 mg a day. 2. Calcium acetate 667 mg three capsules t.i.d. with meals. 3. Clopidogrel 75 mg a day. 4. Coreg 30 mg a day. 5. Humulin 70/30, 35 units q.a.m., 15 units q.p.m. 6. Lipitor 40 mg a day. 7. Lisinopril 5 mg a day. 8. Metoclopramide 10 mg t.i.d. 9. Sensipar 30 mg one tablet a day.  STUDIES PERFORMED: 1. CT of the brain on admission shows no acute abnormality.  Atrophy.     Chronic small vessel disease.  Remote white matter infarct. 2. MRI of the brain shows acute/subacute left periventricular white     matter infarct in the posterior left corona radiata.  Second area     of focal infarct along the more inferior left cortical spinal     tract.  Multiple remote lacunar infarcts involving the basal     ganglia and corona radiata bilaterally.  Remote lacunar infarct in     the mid brain.  Atrophy.  Extensive white matter disease. 3. MRA of the brain shows diffuse small vessel disease.     Atherosclerotic changes within the cavernous carotid arteries     bilaterally without significant stenosis.  Tortuous  cervical     internal carotid arteries bilaterally, likely secondary to chronic     hypertension. 4. 2-D echo shows EF of 45%-50% with severe hypokinesis and no source     of embolus. 5. Carotid Doppler shows no ICA stenosis. 6. EKG, not present in chart though telemetry strips show first-degree     AV block.  LABORATORY STUDIES:  Sodium 133, chloride 94, glucose 108, BUN 45, creatinine 6.77, GFR 8, albumin 2.9, phosphorus 5.1, otherwise normal. CBC with hemoglobin 11.4, hematocrit 32.2, white blood cells 6.6, and platelets 105.  Cholesterol 202, triglycerides 146, HDL 38, LDL 135. Hemoglobin A1c 6.2.  Coagulation studies, PT 14.2, INR 1.08, and PTT 32. Liver function tests normal.  Cardiac enzymes negative.  HISTORY OF PRESENT ILLNESS:  Greg [name] is a 65 year old right- handed [place] male with history of end-stage renal disease, diabetes, and hypertension.  The patient was on hemodialysis the day prior to admission, when at 5:30 p.m., noticed acute onset of right facial numbness and some slurred speech.  The patient went home and came back to the hospital the following day when his symptoms were persistent. The patient has not  had any new numbness or weakness in the arms or legs or change in balance.  The patient does have significant gait imbalance at baseline with prior history of stroke and diabetic peripheral neuropathy.  He uses a walker for ambulation.  The patient reports no troubles with swallowing.  He did have a slight headache the day prior to admission, but was not severe.  The patient is being admitted to the hospital for evaluation of the subcortical left brain stroke with a NIH stroke scale of 4 on arrival.  CT of the brain showed no acute abnormality.  He was not a TPA candidate secondary to delay in arrival.  HOSPITAL COURSE:  MRI revealed acute left corona radiata infarct as well as a small left anterior cortical spinal tract infarct.  He had multiple old  lacunas with atrophy and extensive small vessel disease.  The patient was on Plavix prior to admission.  We will continue Plavix at the time of discharge given acute strokes as well as history of coronary artery disease and stents.  The patient also was found to have dyslipidemia with LDL 135 on Lipitor 40.  We will increase Lipitor to 60 at time of discharge.  The patient also with diabetes with hemoglobin A1c of 6.2.  The patient also has end-stage renal disease, on hemodialysis, followed by [facility] prior to admission.  They were consulted to continue dialysis in hospital.  The patient was evaluated by PT, OT, speech therapy, and felt to benefit from inpatient rehab.  Wife is supportive and will provide care at time of discharge.  Arrangements are in place for him to discharge to inpatient rehab for ongoing therapies.  He is able to tolerate a regular consistency thin liquid diet.  DISCHARGE PLAN: 1. Discharge to rehab for ongoing PT, OT, and speech therapy. 2. Plavix for secondary stroke prevention. 3. Increase Lipitor to 60 mg daily. 4. Follow up primary care physician within 1 month of discharge from     inpatient rehab. 5. Follow up with Dr. Pearlean Brownie in his office in 2 months.     Annie Main, N.P.   ______________________________ Sunny Schlein. Pearlean Brownie, MD    SB/MEDQ  D:  08/13/2010  T:  08/13/2010  Job:  [id number]  cc:   Brenna Friesenhahn P. Pearlean Brownie, MD Eduardo Osier Sharyn Lull, M.D. [facility]  Electronically Signed by Annie Main N.P. on 08/13/2010 04:13:00 PM Electronically Signed by Delia Heady MD on 08/19/2010 06:55:29 AM

## 2010-08-20 DIAGNOSIS — N186 End stage renal disease: Secondary | ICD-10-CM

## 2010-08-20 DIAGNOSIS — I2510 Atherosclerotic heart disease of native coronary artery without angina pectoris: Secondary | ICD-10-CM

## 2010-08-20 DIAGNOSIS — I10 Essential (primary) hypertension: Secondary | ICD-10-CM

## 2010-08-20 DIAGNOSIS — I6330 Cerebral infarction due to thrombosis of unspecified cerebral artery: Secondary | ICD-10-CM

## 2010-08-20 LAB — GLUCOSE, CAPILLARY
Glucose-Capillary: 99 mg/dL (ref 70–99)
Glucose-Capillary: 130 mg/dL — ABNORMAL HIGH (ref 70–99)
Glucose-Capillary: 119 mg/dL — ABNORMAL HIGH (ref 70–99)

## 2010-08-21 ENCOUNTER — Inpatient Hospital Stay (HOSPITAL_COMMUNITY): Payer: Medicaid Other

## 2010-08-21 LAB — DIFFERENTIAL
Lymphocytes Relative: 22 % (ref 12–46)
Lymphs Abs: 1.9 10*3/uL (ref 0.7–4.0)
Monocytes Relative: 14 % — ABNORMAL HIGH (ref 3–12)
Neutro Abs: 5.3 10*3/uL (ref 1.7–7.7)
Neutrophils Relative %: 61 % (ref 43–77)

## 2010-08-21 LAB — RENAL FUNCTION PANEL
CO2: 28 mEq/L (ref 19–32)
Calcium: 9.2 mg/dL (ref 8.4–10.5)
Chloride: 99 mEq/L (ref 96–112)
Creatinine, Ser: 6.32 mg/dL — ABNORMAL HIGH (ref 0.50–1.35)
GFR calc Af Amer: 11 mL/min — ABNORMAL LOW (ref >60)
Glucose, Bld: 117 mg/dL — ABNORMAL HIGH (ref 70–99)

## 2010-08-21 LAB — CBC
Hemoglobin: 11.2 g/dL — ABNORMAL LOW (ref 13.0–17.0)
MCH: 29 pg (ref 26.0–34.0)
MCV: 84.7 fL (ref 78.0–100.0)
RBC: 3.86 MIL/uL — ABNORMAL LOW (ref 4.22–5.81)

## 2010-08-22 LAB — GLUCOSE, CAPILLARY
Glucose-Capillary: 149 mg/dL — ABNORMAL HIGH (ref 70–99)
Glucose-Capillary: 228 mg/dL — ABNORMAL HIGH (ref 70–99)
Glucose-Capillary: 213 mg/dL — ABNORMAL HIGH (ref 70–99)

## 2010-08-23 LAB — GLUCOSE, CAPILLARY: Glucose-Capillary: 120 mg/dL — ABNORMAL HIGH (ref 70–99)

## 2010-08-24 ENCOUNTER — Inpatient Hospital Stay (HOSPITAL_COMMUNITY): Payer: Medicaid Other

## 2010-08-24 DIAGNOSIS — I2510 Atherosclerotic heart disease of native coronary artery without angina pectoris: Secondary | ICD-10-CM

## 2010-08-24 DIAGNOSIS — I10 Essential (primary) hypertension: Secondary | ICD-10-CM

## 2010-08-24 DIAGNOSIS — N186 End stage renal disease: Secondary | ICD-10-CM

## 2010-08-24 DIAGNOSIS — I6330 Cerebral infarction due to thrombosis of unspecified cerebral artery: Secondary | ICD-10-CM

## 2010-08-24 LAB — RENAL FUNCTION PANEL
CO2: 25 mEq/L (ref 19–32)
Chloride: 96 mEq/L (ref 96–112)
Creatinine, Ser: 7.32 mg/dL — ABNORMAL HIGH (ref 0.50–1.35)
GFR calc Af Amer: 9 mL/min — ABNORMAL LOW (ref >60)
GFR calc non Af Amer: 8 mL/min — ABNORMAL LOW (ref >60)
Sodium: 135 mEq/L (ref 135–145)

## 2010-08-24 LAB — CBC
MCV: 84.4 fL (ref 78.0–100.0)
Platelets: 101 10*3/uL — ABNORMAL LOW (ref 150–400)
RBC: 3.78 MIL/uL — ABNORMAL LOW (ref 4.22–5.81)
WBC: 10 10*3/uL (ref 4.0–10.5)

## 2010-08-24 LAB — GLUCOSE, CAPILLARY: Glucose-Capillary: 70 mg/dL (ref 70–99)

## 2010-08-24 NOTE — H&P (Signed)
NAMEIZIK, BINGMAN                 ACCOUNT NO.:  [id number]  MEDICAL RECORD NO.:  [id number]  LOCATION:  4100                         FACILITY:  [facility]  PHYSICIAN:  Ranelle Oyster, M.D.DATE OF BIRTH:  1945/09/30  DATE OF ADMISSION:  08/13/2010 DATE OF DISCHARGE:                             HISTORY & PHYSICAL   CHIEF COMPLAINT:  Decreased balance, dysarthria.  PRIMARY CARE PHYSICIAN:  Ricki Rodriguez, MD  HISTORY OF PRESENT ILLNESS:  This a 65 year old [place] male with end- stage renal disease and diabetes, who was admitted on August 11, 2010, with numbness of his right face as well as slurred speech and balance problems.  CT did not show acute change changes, however, MRI of the brain showed acute/subacute infarct and a periventricular white matter/left corona radiata with second focal area in a left corticospinal tract.  He also had multiple remote lacunar infarcts, diffuse small vessel disease.  Two-D echo showed ejection fraction of 45- 50% with severe hypokinesia of the inferolateral myocardium and mitral valve regurgitation.  Neurology felt this infarct was due to small vessel disease.  Recommended continuing Plavix.  Therapies were initiated and he has had problems with balance and needs cues for sequencing and heel strike.  Rehab was asked to see this patient and felt he could benefit from inpatient rehab stay.  REVIEW OF SYSTEMS:  Notable for the above.  Does report constipation. Full 12-point review is in the written H and P.  PAST MEDICAL HISTORY:  Positive for 1. Diabetes type 2. 2. Hypertension. 3. CAD. 4. Previous stroke with mild right hemiparesis. 5. Peripheral neuropathy with claudication. 6. Obesity. 7. Left AV graft. 8. Hepatitis C. 9. End-stage renal disease. 10.Pulmonary nodules.  FAMILY HISTORY:  Positive for diabetes and stroke.  SOCIAL HISTORY:  The patient is married, lives in two-level house and two steps to enter.  Daughter lives  upstairs.  He quit smoking 25 years. Does not drink.  Wife can provide assistance at home.  ALLERGIES:  None.  HOME MEDICATIONS AND LABS:  Please see written H and P.  PHYSICAL EXAM:  VITAL SIGNS:  Blood pressure 154/70, pulse is 60, respiratory rate is 20, temperature 98.7. GENERAL:  The patient is a pleasant sitting in bed.  No acute stress. HEENT:  Pupils equal, round, reactive to light.  Ear, nose and throat exam notable for poor dentition.  Pink moist mucosa. NECK:  Supple without JVD or lymphadenopathy. CHEST:  Clear to auscultation bilaterally without wheezes, rales or rhonchi. HEART:  Regular rate and rhythm with systolic murmur auscultated. EXTREMITIES:  No clubbing, cyanosis or edema.  Left AV graft had thrill and was intact. ABDOMEN:  Soft, nontender.  Bowel sounds are positive. SKIN:  Generally intact throughout. CRANIAL NERVE:  Notable for mild right facial sensory loss.  He may have had a mild right facial weakness.  No tongue deviation was seen.  Speech is slightly dysarthric.  Reflexes 1+.  Sensation grossly intact in right arm and leg.  The patient had no obvious pronator drift.  Did lack some fine motor coordination in the upper extremities.  Sitting balance was fair.  Strength was grossly 4/5 right upper extremity, 4/5  left upper extremity proximal distal.  Lower extremity strength is grossly 4/5 symmetrically.  Judgment, orientation,  memory, and mood seem to be all appropriate.  POST ADMISSION PHYSICIAN EVALUATION.: 1. Functional deficit secondary to left-sided small vessel/cortical     stroke with mild dysarthria and impaired balance.  This is in the     setting of a prior stroke and peripheral neuropathy. 2. The patient is admitted to receive collaborative interdisciplinary     care between the physiatrist, rehab nursing staff, and therapy     team. 3. The patient's level of medical complexity and substantial therapy     needs in context of that medical  necessity cannot be provided a     lesser intensity of care. 4. The patient has experienced substantial functional loss from his     baseline.  Premorbidly, he was independent, using a walker.  He     uses a wheelchair sometimes after hemodialysis due to fatigue.  Did     not drive.  Judging by the patient's diagnosis, physical exam and     functional history, he has potential functional progress which will     result in measurable gains while in inpatient rehab.  Gains will be     of substantial practical use upon discharge to home in facilitating     mobility and self-care. 5. The physiatrist will provide 24-hour management of medical needs as     well as oversight of therapy plan/treatment and provide guidance as     appropriate regarding interaction of the two.  Medical problem list     and plan are below. 6. A 24-hour rehab nursing team will assist in management of the     patient's skin care needs as well as bowel function, nutrition, and     integration of therapy concepts and techniques. 7. PT will assess and treat for lower extremity strength, range of     motion, functional mobility, balance, safety and adaptive     equipment.  Parkinson balance assessment will be done on admission.     Goals will be modified independent.  He may need occasional     supervision. 8. OT will assess and treat for upper extremity use ADLs, adaptive     techniques, equipment, functional mobility, safety, upper extremity     strength, safety awareness with goals modified independent to set     up. 9. Speech Language Pathology will assess and treat for dysarthria and     communication with goals modified independent. 10.Case management and social worker will assess and treat for     psychosocial issues and discharge planning. 11.Team conferences will be held weekly to assess progress towards     goals and to determine barriers at discharge. 12.The patient has demonstrated sufficient medical  stability and     exercise capacity to tolerate at least 3 hours of therapy per day     at least 5 days per week. 13.Estimated length of stay is 1 week.  Prognosis is good.  The     patient is quite motivated.  MEDICAL PROBLEM LIST AND PLAN.: 1. Stroke prophylaxis with Plavix.  This will continue as this was     deemed as small-vessel event.  The patient's blood counts have been     stable.  His platelets to bear further watching. 2. Diabetes:  Sugars have been somewhat labile with recent     hypoglycemia noted.  We will decrease 70/30 insulin to 20 units  q.a.m. and use 25 units in the p.m.Marland Kitchen  Check CBCs a.c. and at     bedtime.  Observe for effect of better diet control as well as     exercise on the patient's blood sugars. 3. History of CAD, hypertension:  Coreg on board 20 mg in a.m., 10 mg     q.p.m.Marland Kitchen  Blood pressure is borderline today.  Follow with increased     activities and therapy.  Heart rate is well-controlled on     evaluation at 60 beats per minute.  Follow closely for effects of increased physical exertion with therapies, and the effects of that exertion on his CV parameters. 4. Hypertension:  Lisinopril and Coreg as above.  Hold parameters     given to avoid hypotension. 5. Renal:  The patient will be on Tuesday, Thursday, Saturday     hemodialysis schedule per [facility].  We will     schedule dialysis after therapy to avoid excessive fatigue during     the day.     Ranelle Oyster, M.D.     ZTS/MEDQ  D:  08/13/2010  T:  08/13/2010  Job:  [id number]  cc:   Ricki Rodriguez, M.D.  Electronically Signed by Faith Rogue M.D. on 08/24/2010 11:55:33 AM

## 2010-08-26 ENCOUNTER — Inpatient Hospital Stay (HOSPITAL_COMMUNITY): Payer: Medicaid Other

## 2010-08-26 LAB — GLUCOSE, CAPILLARY
Glucose-Capillary: 205 mg/dL — ABNORMAL HIGH (ref 70–99)
Glucose-Capillary: 120 mg/dL — ABNORMAL HIGH (ref 70–99)

## 2010-09-02 NOTE — H&P (Signed)
NAMEZERIC, BARANOWSKI NO.:  [id number]  MEDICAL RECORD NO.:  [id number]  LOCATION:  3009                         FACILITY:  [facility]  PHYSICIAN:  Marlan Palau, M.D.  DATE OF BIRTH:  [birth year]  DATE OF ADMISSION:  08/11/2010 DATE OF DISCHARGE:                             HISTORY & PHYSICAL   HISTORY OF PRESENT ILLNESS:  Greg [name] is a 65 year old right-handed male with a history of end-stage renal disease, diabetes, and hypertension.  This patient was on hemodialysis yesterday around 5:30 p.m. and noted onset of right facial numbness and some slurred speech. The patient went home and came back to the hospital today when his symptoms were persistent.  The patient has not had any new numbness or weakness on the arms or legs or change in balance.  This patient does have significant gait imbalance at baseline with the prior history of a stroke and diabetic peripheral neuropathy and uses a walker for ambulation.  The patient reports no troubles with swallowing.  Did note a slight headache yesterday, but this was not severe.  The patient is currently being admitted to the hospital for evaluation of a subcortical left brain stroke with NIH stroke scale score of 4.  CT scan of the brain showed no acute changes but did show evidence of some small vessel ischemic changes that were chronic.  PAST MEDICAL HISTORY: 1. New onset of right face numbness and dysarthria. 2. Diabetes. 3. Hypertension. 4. Obesity. 5. Gait disorder. 6. Diabetic peripheral neuropathy. 7. History of prior strokes. 8. Myocardial infarction in the past. 9. History of hepatitis C. 10.Bilateral cataract surgery with lens implant.  MEDICATIONS: 1. Lantus insulin 25 units subcu twice daily. 2. Lipitor 40 mg daily. 3. Metoclopramide 10 mg three times daily. 4. Sensipar 30 mg daily. 5. Plavix 75 mg daily. 6. Lisinopril 5 mg daily. 7. Coreg 20 mg in the morning, 10 mg in the evening. 8.  Calcium acetate 667 mg 3 capsules three times a day with meals.  The patient does not smoke or drink.  Again, has no known allergies.  FAMILY MEDICAL HISTORY:  The mother is still living, has diabetes and stroke history.  Father died of an MI.  The patient has 3 brothers, 2 sisters.  One brother has diabetes and chronic renal insufficiency.  The patient has 5 children, one son died with a brain tumor.  The patient is married and is retired.  The patient lives in [place], in the [place] area.  REVIEW OF SYSTEMS:  Notable for no recent fevers or chills.  The patient does note slight headache.  Denies neck pain, back pain.  Has had some slurred speech.  Denies problems swallowing.  Denies shortness of breath, chest pains, abdominal pain, nausea, vomiting, blacking-out episodes, dizziness.  The patient walks with a walker at baseline.  Has numbness in the feet.  PHYSICAL EXAMINATION:  VITAL SIGNS:  Blood pressure 165/73, heart rate is 63, respiratory rate 18, temperature afebrile. GENERAL:  The patient is a minimally obese [place] male who is alert and cooperative at time of the examination. HEENT:  The patient has pupils that are postsurgical, reactive. NECK:  Supple.  No carotid bruits noted. RESPIRATORY:  Clear. CARDIOVASCULAR:  Reveals regular rate and rhythm.  No obvious murmurs or rubs noted. EXTREMITIES:  Without significant edema. ABDOMEN:  Positive bowel sounds.  No organomegaly or tenderness noted. The patient has full extraocular movements.  Visual fields are full. Speech is slightly dysarthric, not aphasic.  The patient has good pinprick sensation on the face bilaterally.  The patient has slight depression of the left nasolabial fold with asymmetric smile.  The patient has fairly good strength in all fours.  Has slight ataxia with finger-nose-finger and heel-to-shin bilaterally.  Gait was not tested. Deep tendon reflexes are symmetric.  Toes neutral.  The  patient has stock and glove, pinprick, sensory deficit up to the knees bilaterally. Vibration and sensation is impaired in both feet and more normal in the arms.  No evidence of extinction is noted.  No drift is seen on the arms and legs.  NIH stroke scale score is 4.  LABORATORY VALUES:  Notable for a white count of 7.0, hemoglobin 12.3, hematocrit 36.8, platelets of 106, MCV of 85.2, INR of 1.08, sodium 135, potassium 5.0, chloride of 92, CO2 of 33, glucose of 168, BUN of 31, creatinine of 5.39, alk phosphatase is 62, SGOT of 23, SGPT of 19, total protein 7.6, albumin is 3.1, calcium is 10.0, CK of 43, MB fraction 2.5, troponin I of 0.3.  CT of the head is as above.  IMPRESSION: 1. New onset left brain subcortical stroke. 2. Diabetes. 3. Hypertension.  This patient has had a very small infarct, likely subcortical.  The patient will be brought in for a brief workup.  PLAN:  MRI of the brain, MRA of the head.  Admission to unit 3000, monitored bed.  A carotid Doppler, 2-D echocardiogram, ongoing Plavix therapy.  We will follow the patient's clinical course while in-house. The patient will continue hemodialysis in the hospital.  C. Lesia Sago, M.D.     CKW/MEDQ  D:  08/11/2010  T:  08/12/2010  Job:  [id number]  cc:   Ricki Rodriguez, M.D. [facility]  Electronically Signed by Thana Farr M.D. on 09/02/2010 09:23:25 AM

## 2010-09-08 NOTE — Discharge Summary (Signed)
NAMEDORYAN, BAHL                 ACCOUNT NO.:  [id number]  MEDICAL RECORD NO.:  [id number]  LOCATION:  4030                         FACILITY:  [facility]  PHYSICIAN:  Erick Colace, M.D.DATE OF BIRTH:  06-Mar-1945  DATE OF ADMISSION:  08/13/2010 DATE OF DISCHARGE:  08/24/2010                              DISCHARGE SUMMARY   DISCHARGE DIAGNOSES: 1. Left corona radiata infarct with decreased mobility, worsening left     hemiparesis, and dysarthria. 2. End-stage renal disease. 3. Hypertension. 4. Diabetes mellitus.  HISTORY OF PRESENT ILLNESS:  Mr. Geisen is a 65 year old male with history of end-stage renal disease, diabetes mellitus, hypertension, admitted August 11, 2010, with numbness right face with slurred speech.  CT of head done showed no acute changes.  MRI/MRA of brain showed acute/subacute infarct, periventricular white matter, left corona radiata, and second focal area of left cortical spinal tract infarct, multiple remote lacuna and diffuse small vessel disease with atherosclerotic changes.  A 2-D echo done showed EF of 45-50% with severe hypokinesis of inferior lateral myocardium and mild MVR.  Neuro felt patient with lacunar infarct due to small vessel disease and recommends continuing Plavix alone.  He is noted to have dyslipidemia with LDL increased to 135.  The patient reports that he has missed his Lipitor doses approximately a month while in [place].  Therapies initiated and the patient is noted to have decreased balance, requires cues for sequencing and heel strike.  The patient was evaluated by Rehab and we felt he would benefit from a CIR program.  PAST MEDICAL HISTORY:  Significant for DM type 2, hypertension, coronary artery disease, CVA with mild right hemiparesis, peripheral neuropathy with claudication, obesity, left AV graft, PTA June 2012, hep C, end- stage renal disease with hemodialysis Tuesday, Thursday, Saturdays, and pulmonary  nodules.  ALLERGIES:  No known drug allergies.  REVIEW OF SYMPTOMS:  Positive for weakness, numbness, and intermittent left foot pain.  FAMILY HISTORY:  Positive for diabetes mellitus and CVA.  SOCIAL HISTORY:  The patient is married, lives in two-level home with two steps at entry with family.  Does not use the use any tobacco, quit 25 years ago.  Does not use any alcohol.  Wife is supportive and at home and is to assist past discharge.  FUNCTIONAL HISTORY:  The patient was independent with walker.  Tends to use wheelchair on hemodialysis days due to fatigue.  Does not drive.  FUNCTIONAL STATUS:  The patient is modified independent for bed mobility, mod assist transfers, mod assist ambulating 30 feet with rolling walker.  PHYSICAL EXAMINATION:  VITAL SIGNS:  Blood pressure 154/70, pulse 60, respiratory rate 20, temperature 98.7. GENERAL:  The patient is pleasant male, well-nourished, well-developed, no acute distress. HEENT:  Pupils equal, round, reactive to light.  Oral mucosa is moist. Poor dentition. NECK:  Supple without JVD or lymphadenopathy. LUNGS:  Clear to auscultation bilaterally without wheezes, rales or rhonchi. HEART:  Shows regular rate rhythm without systolic murmur. ABDOMEN:  Soft, nontender with positive bowel sounds. SKIN:  Intact without breakdown. NEUROLOGIC:  Notable for mild right facial sensory loss.  He may have mild right facial weakness.  No tongue deviation seen.  Speech is slightly dysarthric.  Reflexes 1+.  Sensation grossly intact with right arm and leg.  The patient with no obvious pronator drift.  Does lack some fine motor coordination in upper extremity.  Sitting balance is fair.  Strength is grossly 4/5 right and left upper extremities.  Lower extremity strength is grossly 4/5 symmetrically.  Judgment orientation, memory, mood are all appropriate.  HOSPITAL COURSE:  Mr. Graiden Henes was admitted to rehab on August 13, 2010, for inpatient  therapies to consist of PT, OT and speech therapy at least 3-hour 5 days a week.  Past admission physiatrist rehab RN and therapy team have worked together to provide customized collaborative interdisciplinary care.  Rehab RN has worked with the patient on bowel and bladder program as well as dietary education.  The patient's p.o. intake was monitored and he has been offered nutritional supplements to help maintain his nutritional status.  CBGs were checked on a.c. and bedtime basis, and blood sugars are currently ranging from 80s to 250s range.  Blood sugars elevated this morning at 205 due to refusal of 70/30 insulin last night.  The patient has been educated about need for 70/30 insulin consistently and to decrease dose if he feels his blood sugar is low.  The patient's blood pressures have been checked on b.i.d. basis.  These are currently ranging from 130s-160s systolic, 60s to 16X diastolic.  The patient has had complaints of left foot pain, question due to combination of claudication, neuropathy and also musculoskeletal in nature.  Voltaren gel was added t.i.d. to help with his pain issues. Renal Service has been following for hemodialysis which has been ongoing on Tuesday, Thursday, Saturday schedule.  During the patient's stay in rehab, weekly team conferences were held to monitor the patient's progress and goals as well as discuss barriers to discharge.  At the time of admission, the patient was noted to be impaired by decreased strength, decreased sitting and standing balance, abnormal posture, and decreased endurance.  He was also noted to have mild cognitive deficits with decreased anticipatory awareness, decreased problem solving, and decrease short-term memory recall.  Speech Therapy has worked with the patient on his dysarthria with utilization of intelligibility strategies to help with speech.  They have also been working on problem solving for functional and familiar  tasks.  The patient has made good progress and is at modified independent for basic problem solving, recall, emergent awareness and selective attention. The patient and family education has been done and family is to provide 24-hour supervision past discharge.  No further speech therapy warranted at this time.  At admission, the patient was noted to be impaired by poor truncal strength with question of apraxia and ataxia bilateral upper extremity with decreased fine motor control.  OT has worked with the patient on balance and fine motor control and hand-eye coordination. Currently, the patient is at supervision level for all self-care tasks and transfers.  He does need great deal of time to complete these tasks and wife has been educated not to help him, but provide him instead with extra time to complete discharging tasks.  The patient is able to perform tub bench transfers and tub without sliding board.  Further followup home health OT to continue past discharge.  Physical Therapy has been working with the patient on mobility and strengthening.  At admission, the patient was max assist for transfers, was able to perform sit to stand transfers in parallel bars with mod assist with left knee block.  He was able to ambulate 5 feet in parallel bars at mod assist level.  Currently, he is at supervision level with sliding board transfers.  He is mod assist for gait at 50 feet.  Due to amount of assist and cue needed for mobility, family has been educated regarding walking with the patient when Physical Therapy present.  Further followup home health PT to continue the [facility].  On since August 26, 2010, the patient is discharged to home.  DISCHARGE MEDICATIONS: 1. Lipitor 40 mg p.o. nightly. 2. Coreg 10 mg nightly and 20 mg in a.m. 3. PhosLo 667 mg three caps t.i.d. with meals. 4. Gabapentin 200 mg p.o. nightly. 5. Insulin 70/30, 15-35 units b.i.d., titrate per home schedule. 6.  Lipitor 40 mg daily. 7. Lisinopril 5 mg per day. 8. Reglan 10 mg p.o. t.i.d. a.c. 9. Sensipar 30 mg p.o. per day.  DIET:  Renal diet with diabetic restrictions.  ACTIVITY LEVEL:  Twenty-four-hour supervision.  No strenuous activity. No driving.  SPECIAL INSTRUCTIONS:  [facility] to provide PT, OT and RN.  FOLLOWUP:  The patient to follow up with Dr. Wynn Banker September 10, 2010, at 1:15 for 1:30 appointment.  Follow up with Dr. Pearlean Brownie in 6 weeks. Follow up with Dr. Sharyn Lull for routine check in the next few weeks.     Delle Reining, P.A.   ______________________________ Erick Colace, M.D.    PL/MEDQ  D:  08/26/2010  T:  08/27/2010  Job:  [id number]  cc:   Eduardo Osier. Sharyn Lull, M.D. Pramod P. Pearlean Brownie, MD [facility]  Electronically Signed by Osvaldo Shipper. on 09/06/2010 11:20:22 AM Electronically Signed by Claudette Laws M.D. on 09/08/2010 10:20:34 AM

## 2010-09-10 ENCOUNTER — Encounter: Payer: Medicaid Other | Attending: Physical Medicine & Rehabilitation

## 2010-09-10 ENCOUNTER — Inpatient Hospital Stay (HOSPITAL_BASED_OUTPATIENT_CLINIC_OR_DEPARTMENT_OTHER): Payer: Medicaid Other | Admitting: Physical Medicine & Rehabilitation

## 2010-09-10 DIAGNOSIS — R209 Unspecified disturbances of skin sensation: Secondary | ICD-10-CM | POA: Insufficient documentation

## 2010-09-10 DIAGNOSIS — I69993 Ataxia following unspecified cerebrovascular disease: Secondary | ICD-10-CM | POA: Insufficient documentation

## 2010-09-10 DIAGNOSIS — I120 Hypertensive chronic kidney disease with stage 5 chronic kidney disease or end stage renal disease: Secondary | ICD-10-CM | POA: Insufficient documentation

## 2010-09-10 DIAGNOSIS — G589 Mononeuropathy, unspecified: Secondary | ICD-10-CM | POA: Insufficient documentation

## 2010-09-10 DIAGNOSIS — R5381 Other malaise: Secondary | ICD-10-CM | POA: Insufficient documentation

## 2010-09-10 DIAGNOSIS — M62838 Other muscle spasm: Secondary | ICD-10-CM | POA: Insufficient documentation

## 2010-09-10 DIAGNOSIS — K5900 Constipation, unspecified: Secondary | ICD-10-CM | POA: Insufficient documentation

## 2010-09-10 DIAGNOSIS — F341 Dysthymic disorder: Secondary | ICD-10-CM | POA: Insufficient documentation

## 2010-09-10 DIAGNOSIS — N186 End stage renal disease: Secondary | ICD-10-CM | POA: Insufficient documentation

## 2010-09-10 NOTE — Assessment & Plan Note (Signed)
HISTORY:  A 65 year old male with a CVA resulting in the inpatient hospitalization followed by rehabilitation hospitalization.  His dates the hospitalization on the rehab unit from August 13, 2010 to August 24, 2010.  Date of inpatient hospitalization on the acute care side from August 1 to August 13, 2010.  On exam, he had left posterior corona radiata infarct.  Extensive white matter disease.  He has been seen by home health therapy.  His family is assisting with him.  He transfers independently but ambulates with assistance, somebody does need to hold on to him.  He needs assist with dressing, bathing, but gets to the toilet on his own per his report.  He is mainly in the wheelchair during the day, needs help with meal prep and household duties as well as shopping.  When he left the hospital he was ambulating only 5 feet in the perilobar to mod assist level.  DISCHARGE MEDS FROM THE HOSPITAL: 1. Lipitor 40 mg a day. 2. Coreg 10 mg at night and 20 in the morning. 3. PhosLo 667 t.i.d. 4. Gabapentin 200 nightly. 5. Insulin 70/30 15-35 units b.i.d.. 6. Lipitor 40 mg a day. 7. Lisinopril 5 mg a day. 8. Reglan 10 mg t.i.d. 9. Sensipar 30 mg a day.  REVIEW OF SYSTEMS:  Bowel problems, weakness, numbness, trouble walking, spasms, depression anxiety, constipation, poor appetite.  Blood pressure 116/49, pulse 70, respirations 16, and O2 sat 97% on room air.  His motor strength is 5/5 in the upper extremities.  He does have some dysmetria on finger-nose-finger testing left greater than right side. In lower extremity, he has decreased heel-to-shin appears to be mainly ataxic.  Sensation is intact.  Deep tendon reflex normal.  No evidence of spasticity.  IMPRESSION:  Subcortical infarct, has periventricular infarcts.  I suspect that his main disability is ataxia, however, not a whole lot showed up in terms of his cerebellum.  He does have a history end-stage renal disease, does have  neuropathy, and this maybe indeed was playing into part of his functional disability.Marland Kitchen  PLAN:  We will go ahead and continue home health therapy.  I will see him back in 1 month, may be able to transition outpatient at that time.  Followup primary care on his hypertension.  Followup with Renal in terms of his chronic kidney disease.  He is on hemodialysis.  Discussed with the patient and agrees with plan. I spoke with his son about this as well.     Erick Colace, M.D. Electronically Signed   AEK/MedQ D:  09/10/2010 15:07:28  T:  09/10/2010 17:58:56  Job #:  [id number]

## 2010-10-08 ENCOUNTER — Ambulatory Visit: Payer: Medicaid Other | Admitting: Physical Medicine & Rehabilitation

## 2010-10-15 ENCOUNTER — Encounter: Payer: Medicaid Other | Attending: Physical Medicine & Rehabilitation

## 2010-10-15 ENCOUNTER — Ambulatory Visit (HOSPITAL_BASED_OUTPATIENT_CLINIC_OR_DEPARTMENT_OTHER): Payer: Medicaid Other | Admitting: Physical Medicine & Rehabilitation

## 2010-10-15 DIAGNOSIS — K5900 Constipation, unspecified: Secondary | ICD-10-CM | POA: Insufficient documentation

## 2010-10-15 DIAGNOSIS — I120 Hypertensive chronic kidney disease with stage 5 chronic kidney disease or end stage renal disease: Secondary | ICD-10-CM | POA: Insufficient documentation

## 2010-10-15 DIAGNOSIS — N186 End stage renal disease: Secondary | ICD-10-CM | POA: Insufficient documentation

## 2010-10-15 DIAGNOSIS — I69991 Dysphagia following unspecified cerebrovascular disease: Secondary | ICD-10-CM

## 2010-10-15 DIAGNOSIS — I69993 Ataxia following unspecified cerebrovascular disease: Secondary | ICD-10-CM | POA: Insufficient documentation

## 2010-10-15 DIAGNOSIS — R5383 Other fatigue: Secondary | ICD-10-CM | POA: Insufficient documentation

## 2010-10-15 DIAGNOSIS — F341 Dysthymic disorder: Secondary | ICD-10-CM | POA: Insufficient documentation

## 2010-10-15 DIAGNOSIS — M62838 Other muscle spasm: Secondary | ICD-10-CM | POA: Insufficient documentation

## 2010-10-15 DIAGNOSIS — R209 Unspecified disturbances of skin sensation: Secondary | ICD-10-CM | POA: Insufficient documentation

## 2010-10-15 DIAGNOSIS — R5381 Other malaise: Secondary | ICD-10-CM | POA: Insufficient documentation

## 2010-10-15 DIAGNOSIS — G589 Mononeuropathy, unspecified: Secondary | ICD-10-CM | POA: Insufficient documentation

## 2010-10-15 NOTE — Assessment & Plan Note (Signed)
REASON FOR VISIT:  Problems walking after stroke.  Greg [name] is a 65 year old male who I last saw in my clinic on September 10, 2010.  He had onset right facial numbness, had MRI, was admitted to the hospital.  MRI showing acute/subacute left periventricular white matter infarct posterior left corona radiata.  There is multiple lacunar infarcts basal ganglia, corona radiata bilaterally.  It is a lesion immediately adjacent to acute lesion.  He is followed up with Nephrology who manages his dialysis.  He has had home health therapy.  He is finishing up, they have plateaued from what they could do in home health setting.  He continues to have difficulty with ambulation.  He is using a walker and a gait belt with physical therapy.  He does not feel comfortable knee standing even with handheld assist today.  PHYSICAL EXAMINATION:  MUSCULOSKELETAL:  On examination, he has decreased finger-nose-finger on the left side compared to the right side.  His motor strength is at least 4/5 bilateral upper and lower extremities. SPEECH:  No evidence this dysarthria or aphasia. Visual fields are intact.  He has no evidence nystagmus.  No evidence of facial droop. His speech without dysarthria or aphasia.  IMPRESSION:  Cerebrovascular accident mainly with an ataxia in left upper extremity.  I also think he has a truncal ataxia.  He likely has some chronic deconditioning from his end-stage renal disease as well.  PLAN: 1. We will have him go through some outpatient OT/PT. 2. I will see him back in 1 month.  Discussed with patient's son, agree with plan.     Erick Colace, M.D. Electronically Signed    AEK/MedQ D:  10/15/2010 14:14:24  T:  10/15/2010 21:03:01  Job #:  [id number]

## 2010-11-19 ENCOUNTER — Encounter: Payer: Medicaid Other | Attending: Physical Medicine & Rehabilitation

## 2010-11-19 ENCOUNTER — Ambulatory Visit: Payer: Medicaid Other | Admitting: Physical Medicine & Rehabilitation

## 2010-11-19 DIAGNOSIS — R5381 Other malaise: Secondary | ICD-10-CM | POA: Insufficient documentation

## 2010-11-19 DIAGNOSIS — I120 Hypertensive chronic kidney disease with stage 5 chronic kidney disease or end stage renal disease: Secondary | ICD-10-CM | POA: Insufficient documentation

## 2010-11-19 DIAGNOSIS — I69993 Ataxia following unspecified cerebrovascular disease: Secondary | ICD-10-CM | POA: Insufficient documentation

## 2010-11-19 DIAGNOSIS — N186 End stage renal disease: Secondary | ICD-10-CM | POA: Insufficient documentation

## 2010-11-19 DIAGNOSIS — M62838 Other muscle spasm: Secondary | ICD-10-CM | POA: Insufficient documentation

## 2010-11-19 DIAGNOSIS — G589 Mononeuropathy, unspecified: Secondary | ICD-10-CM | POA: Insufficient documentation

## 2010-11-19 DIAGNOSIS — K5900 Constipation, unspecified: Secondary | ICD-10-CM | POA: Insufficient documentation

## 2010-11-19 DIAGNOSIS — R209 Unspecified disturbances of skin sensation: Secondary | ICD-10-CM | POA: Insufficient documentation

## 2010-11-19 DIAGNOSIS — F341 Dysthymic disorder: Secondary | ICD-10-CM | POA: Insufficient documentation

## 2011-04-21 ENCOUNTER — Other Ambulatory Visit: Payer: Self-pay | Admitting: Vascular Surgery

## 2011-08-23 ENCOUNTER — Observation Stay (HOSPITAL_COMMUNITY)
Admission: EM | Admit: 2011-08-23 | Discharge: 2011-08-24 | Disposition: A | Discharge status: Home / Self Care | Payer: Medicaid Other | Attending: Emergency Medicine | Admitting: Emergency Medicine

## 2011-08-23 ENCOUNTER — Observation Stay (HOSPITAL_COMMUNITY): Payer: Medicaid Other

## 2011-08-23 ENCOUNTER — Emergency Department (HOSPITAL_COMMUNITY): Payer: Medicaid Other

## 2011-08-23 ENCOUNTER — Encounter (HOSPITAL_COMMUNITY): Payer: Self-pay | Admitting: *Deleted

## 2011-08-23 DIAGNOSIS — F1020 Alcohol dependence, uncomplicated: Secondary | ICD-10-CM | POA: Insufficient documentation

## 2011-08-23 DIAGNOSIS — N186 End stage renal disease: Secondary | ICD-10-CM | POA: Insufficient documentation

## 2011-08-23 DIAGNOSIS — E119 Type 2 diabetes mellitus without complications: Secondary | ICD-10-CM | POA: Insufficient documentation

## 2011-08-23 DIAGNOSIS — R262 Difficulty in walking, not elsewhere classified: Secondary | ICD-10-CM | POA: Insufficient documentation

## 2011-08-23 DIAGNOSIS — G459 Transient cerebral ischemic attack, unspecified: Principal | ICD-10-CM | POA: Insufficient documentation

## 2011-08-23 DIAGNOSIS — Z992 Dependence on renal dialysis: Secondary | ICD-10-CM | POA: Insufficient documentation

## 2011-08-23 DIAGNOSIS — K7030 Alcoholic cirrhosis of liver without ascites: Secondary | ICD-10-CM | POA: Insufficient documentation

## 2011-08-23 HISTORY — DX: Alcohol abuse, uncomplicated: F10.10

## 2011-08-23 HISTORY — DX: Unspecified cirrhosis of liver: K74.60

## 2011-08-23 HISTORY — DX: Cerebral infarction, unspecified: I63.9

## 2011-08-23 LAB — CBC WITH DIFFERENTIAL/PLATELET
Basophils Absolute: 0 10*3/uL (ref 0.0–0.1)
Basophils Relative: 0 % (ref 0–1)
Eosinophils Relative: 3 % (ref 0–5)
HCT: 36.1 % — ABNORMAL LOW (ref 39.0–52.0)
Hemoglobin: 12.1 g/dL — ABNORMAL LOW (ref 13.0–17.0)
Lymphocytes Relative: 25 % (ref 12–46)
MCHC: 33.5 g/dL (ref 30.0–36.0)
MCV: 87.8 fL (ref 78.0–100.0)
Monocytes Absolute: 0.6 10*3/uL (ref 0.1–1.0)
Monocytes Relative: 9 % (ref 3–12)
Neutrophils Relative %: 64 % (ref 43–77)
RBC: 4.11 MIL/uL — ABNORMAL LOW (ref 4.22–5.81)
RDW: 14.2 % (ref 11.5–15.5)

## 2011-08-23 LAB — COMPREHENSIVE METABOLIC PANEL
ALT: 17 U/L (ref 0–53)
Albumin: 2.8 g/dL — ABNORMAL LOW (ref 3.5–5.2)
Alkaline Phosphatase: 66 U/L (ref 39–117)
Potassium: 4 mEq/L (ref 3.5–5.1)
Sodium: 139 mEq/L (ref 135–145)
Total Protein: 7.2 g/dL (ref 6.0–8.3)

## 2011-08-23 LAB — GLUCOSE, CAPILLARY: Glucose-Capillary: 73 mg/dL (ref 70–99)

## 2011-08-23 NOTE — ED Notes (Signed)
Notified IV to deaccess HD graft on left upper arm

## 2011-08-23 NOTE — ED Provider Notes (Signed)
History     CSN: [id number]  Arrival date & time 08/23/11  1359   First MD Initiated Contact with Patient 08/23/11 1603      Chief Complaint  Patient presents with  . Extremity Weakness  . Numbness    rt hand    (Consider location/radiation/quality/duration/timing/severity/associated sxs/prior treatment) HPI Comments: Greg [name] is a 66 y.o. Male who was on dialysis today, when he had sudden onset of dysarthria, and right sided weakness. Both symptoms resolved in 10 minutes. He is completely back to his baseline. He was transferred directly from dialysis unit, to the emergency department. He has an old right brain stroke, with left hemiparesis. He denies headache, weakness, dizziness, nausea, vomiting, fever, cough, chest pain, or shortness of breath. There are no aggravating or palliative factors. [place], he does not know the evaluation that was done , then.  The history is provided by the patient.    Past Medical History  Diagnosis Date  . Diabetes mellitus   . Renal disorder     HD patient  . Cirrhosis   . Alcohol abuse   . Stroke     left sided deficits    Past Surgical History  Procedure Date  . Eye surgery     No family history on file.  History  Substance Use Topics  . Smoking status: Not on file  . Smokeless tobacco: Not on file  . Alcohol Use: Yes     Former      Review of Systems  All other systems reviewed and are negative.    Allergies  Review of patient's allergies indicates no known allergies.  Home Medications   Current Outpatient Rx  Name Route Sig Dispense Refill  . ATORVASTATIN CALCIUM 40 MG PO TABS Oral Take 40 mg by mouth at bedtime.    Marland Kitchen CALCIUM ACETATE 667 MG PO CAPS Oral Take 2,001 mg by mouth 3 (three) times daily with meals. Take 2001mg  with meals & snacks    . CARVEDILOL PHOSPHATE ER 10 MG PO CP24 Oral Take 10 mg by mouth 3 (three) times daily. Takes on dialysis days-Tues, Thurs  & Sat    . CARVEDILOL PHOSPHATE ER 20 MG PO CP24 Oral Take 20 mg by mouth 4 (four) times a week. Takes on non dialysis days-Mon, Wed, Fri & Sun    . CINACALCET HCL 30 MG PO TABS Oral Take 30 mg by mouth at bedtime.    . CLOPIDOGREL BISULFATE 75 MG PO TABS Oral Take 75 mg by mouth daily.    Marland Kitchen GABAPENTIN 100 MG PO CAPS Oral Take 100 mg by mouth at bedtime.    . INSULIN ISOPHANE & REGULAR (70-30) 100 UNIT/ML [place] SUSP Subcutaneous Inject 30-35 Units into the skin 2 (two) times daily with a meal. Takes 35 units in the am & 30 units in the pm    . LISINOPRIL 5 MG PO TABS Oral Take 5 mg by mouth daily.    . ADULT MULTIVITAMIN W/MINERALS CH Oral Take 1 tablet by mouth daily.      BP 194/70  Pulse 59  Temp 97.9 F (36.6 C) (Oral)  Resp 18  SpO2 97%  Physical Exam  Nursing note and vitals reviewed. Constitutional: He is oriented to person, place, and time. He appears well-developed and well-nourished.  HENT:  Head: Normocephalic and atraumatic.  Right Ear: External ear normal.  Left Ear: External ear normal.  Eyes: Conjunctivae and EOM are  normal. Pupils are equal, round, and reactive to light.  Neck: Normal range of motion and phonation normal. Neck supple.  Cardiovascular: Normal rate, regular rhythm, normal heart sounds and intact distal pulses.        Fistula left arm has good thrill. It is nontender to palpation  Pulmonary/Chest: Effort normal and breath sounds normal. He exhibits no bony tenderness.  Abdominal: Soft. Normal appearance. There is no tenderness.  Musculoskeletal: Normal range of motion.  Neurological: He is alert and oriented to person, place, and time. He has normal strength. No cranial nerve deficit or sensory deficit. He exhibits normal muscle tone. Coordination (Mild discoordination, arms, and legs bilaterally.) abnormal.       Horizontal nystagmus is present. Strength is equal in the arms, and legs bilaterally. There is no facial asymmetry  Skin: Skin is warm, dry and  intact.  Psychiatric: He has a normal mood and affect. His behavior is normal. Judgment and thought content normal.    ED Course  Procedures (including critical care time)    Date: 08/23/2011  Rate: 70  Rhythm: normal sinus rhythm  QRS Axis: normal  Intervals: normal  ST/T Wave abnormalities: nonspecific ST/T changes  Conduction Disutrbances:first-degree A-V block   Narrative Interpretation:   Old EKG Reviewed: unchanged   Labs Reviewed  COMPREHENSIVE METABOLIC PANEL - Abnormal; Notable for the following:    BUN 36 (*)     Creatinine, Ser 5.55 (*)     Albumin 2.8 (*)     Total Bilirubin 0.2 (*)     GFR calc non Af Amer 10 (*)     GFR calc Af Amer 11 (*)     All other components within normal limits  CBC WITH DIFFERENTIAL - Abnormal; Notable for the following:    RBC 4.11 (*)     Hemoglobin 12.1 (*)     HCT 36.1 (*)     Platelets 113 (*)  PLATELET COUNT CONFIRMED BY SMEAR   All other components within normal limits  LIPID PANEL - Abnormal; Notable for the following:    Triglycerides 154 (*)     HDL 34 (*)     All other components within normal limits  GLUCOSE, CAPILLARY - Abnormal; Notable for the following:    Glucose-Capillary 140 (*)     All other components within normal limits  GLUCOSE, CAPILLARY  HEMOGLOBIN A1C  CK TOTAL AND CKMB  LAB REPORT - SCANNED   No results found.   1. TIA (transient ischemic attack)       MDM  Evaluation consistent with TIA. Patient is improved. Emergency department evaluation. Does not reveal CVA. Patient transferred to CDU for comprehensive TIA, evaluation.        Flint Melter, MD 08/26/11 [phone]

## 2011-08-23 NOTE — ED Notes (Signed)
Symptoms started 1318 with right side deficits to upper extremities and right eye lid felt drooped with garbled speech.  Resolved by the time ems arrived.  Alert and oriented now.  Dialysis today only last 1 hr and 15 min.  cbg--136, bp 144/76  HR 92

## 2011-08-23 NOTE — ED Provider Notes (Signed)
Patient in CDU under TIA protocol.  Patient with episode of dysarthria and right sided weakness lasting about 10 minutes while at dialysis today.  Symptoms have resolved.  History of CVA with left hemiparesis.  Patient resting comfortably at present with family at bedside.  Patient CAROx4.  Symmetric smile.  No arm drift.  Equal grip strength. Lungs CTA bilaterally. S1/S2, RRR. Abdomen soft.  MRI/MRA results reviewed and discussed with patient and family.  Completion of protocol testing scheduled for tomorrow AM.  Jimmye Norman, NP 08/23/11 2357

## 2011-08-23 NOTE — ED Notes (Signed)
Provider at bedside

## 2011-08-24 DIAGNOSIS — I359 Nonrheumatic aortic valve disorder, unspecified: Secondary | ICD-10-CM

## 2011-08-24 DIAGNOSIS — G459 Transient cerebral ischemic attack, unspecified: Secondary | ICD-10-CM

## 2011-08-24 LAB — LIPID PANEL
Cholesterol: 155 mg/dL (ref 0–200)
Total CHOL/HDL Ratio: 4.6 RATIO
Triglycerides: 154 mg/dL — ABNORMAL HIGH (ref <150)
VLDL: 31 mg/dL (ref 0–40)

## 2011-08-24 LAB — CK TOTAL AND CKMB (NOT AT ARMC): CK, MB: 3.2 ng/mL (ref 0.3–4.0)

## 2011-08-24 LAB — GLUCOSE, CAPILLARY

## 2011-08-24 LAB — HEMOGLOBIN A1C: Mean Plasma Glucose: 100 mg/dL (ref <117)

## 2011-08-24 MED ORDER — ASPIRIN 81 MG PO CHEW
324.0000 mg | CHEWABLE_TABLET | Freq: Once | ORAL | Status: AC
  Administered 2011-08-24: 324 mg via ORAL
  Filled 2011-08-24: qty 4

## 2011-08-24 NOTE — Progress Notes (Signed)
Observation review is complete. 

## 2011-08-24 NOTE — Progress Notes (Signed)
*  PRELIMINARY RESULTS* Vascular Ultrasound Carotid Duplex (Doppler) has been completed.  Preliminary findings: Bilaterally no significant ICA stenosis with antegrade vertebral flow.  Farrel Demark, RDMS, RVT  08/24/2011, 8:50 AM

## 2011-08-24 NOTE — ED Notes (Signed)
Pt. Unable to walk.pt.legs are to weak.pt.states that uses a walker at home ant know way.he cant use his legs at.

## 2011-08-24 NOTE — ED Notes (Signed)
Patient has dialysis graft in left upper arm. He is too weak to ambulate/bear weight/transfer on his legs. States he had a "massive" stroke 10 years ago when he lived in [place]

## 2011-08-24 NOTE — ED Provider Notes (Signed)
History     CSN: [id number]  Arrival date & time 08/23/11  1359   First MD Initiated Contact with Patient 08/23/11 1603      Chief Complaint  Patient presents with  . Extremity Weakness  . Numbness    rt hand    (Consider location/radiation/quality/duration/timing/severity/associated sxs/prior treatment) HPI  Past Medical History  Diagnosis Date  . Diabetes mellitus   . Renal disorder     HD patient  . Cirrhosis   . Alcohol abuse   . Stroke     left sided deficits    Past Surgical History  Procedure Date  . Eye surgery     No family history on file.  History  Substance Use Topics  . Smoking status: Not on file  . Smokeless tobacco: Not on file  . Alcohol Use: Yes     Former      Review of Systems  Allergies  Review of patient's allergies indicates no known allergies.  Home Medications   Current Outpatient Rx  Name Route Sig Dispense Refill  . ATORVASTATIN CALCIUM 40 MG PO TABS Oral Take 40 mg by mouth at bedtime.    Marland Kitchen CALCIUM ACETATE 667 MG PO CAPS Oral Take 2,001 mg by mouth 3 (three) times daily with meals. Take 2001mg  with meals & snacks    . CARVEDILOL PHOSPHATE ER 10 MG PO CP24 Oral Take 10 mg by mouth 3 (three) times daily. Takes on dialysis days-Tues, Thurs & Sat    . CARVEDILOL PHOSPHATE ER 20 MG PO CP24 Oral Take 20 mg by mouth 4 (four) times a week. Takes on non dialysis days-Mon, Wed, Fri & Sun    . CINACALCET HCL 30 MG PO TABS Oral Take 30 mg by mouth at bedtime.    . CLOPIDOGREL BISULFATE 75 MG PO TABS Oral Take 75 mg by mouth daily.    Marland Kitchen GABAPENTIN 100 MG PO CAPS Oral Take 100 mg by mouth at bedtime.    . INSULIN ISOPHANE & REGULAR (70-30) 100 UNIT/ML  SUSP Subcutaneous Inject 30-35 Units into the skin 2 (two) times daily with a meal. Takes 35 units in the am & 30 units in the pm    . LISINOPRIL 5 MG PO TABS Oral Take 5 mg by mouth daily.    . ADULT MULTIVITAMIN W/MINERALS CH Oral Take 1 tablet by mouth daily.      BP 198/80  Pulse  56  Temp 97.7 F (36.5 C) (Oral)  Resp 16  SpO2 97%  Physical Exam  ED Course  Procedures (including critical care time)  Labs Reviewed  COMPREHENSIVE METABOLIC PANEL - Abnormal; Notable for the following:    BUN 36 (*)     Creatinine, Ser 5.55 (*)     Albumin 2.8 (*)     Total Bilirubin 0.2 (*)     GFR calc non Af Amer 10 (*)     GFR calc Af Amer 11 (*)     All other components within normal limits  CBC WITH DIFFERENTIAL - Abnormal; Notable for the following:    RBC 4.11 (*)     Hemoglobin 12.1 (*)     HCT 36.1 (*)     Platelets 113 (*)  PLATELET COUNT CONFIRMED BY SMEAR   All other components within normal limits  LIPID PANEL - Abnormal; Notable for the following:    Triglycerides 154 (*)     HDL 34 (*)     All other components within normal limits  GLUCOSE, CAPILLARY - Abnormal; Notable for the following:    Glucose-Capillary 140 (*)     All other components within normal limits  GLUCOSE, CAPILLARY  CK TOTAL AND CKMB  HEMOGLOBIN A1C   Ct Head Wo Contrast  08/23/2011  *RADIOLOGY REPORT*  Clinical Data: Right-sided deficits in the upper extremities. Slurred speech.  Possible TIA or stroke.  CT HEAD WITHOUT CONTRAST  Technique:  Contiguous axial images were obtained from the base of the skull through the vertex without contrast.  Comparison: Brain MRI 08/12/2010.  Head CT 08/11/2010.  Findings: Again noted is cerebral and cerebellar atrophy which is age advanced.  There are multifocal areas of low attenuation throughout the deep and periventricular white matter of the cerebral hemispheres bilaterally, consistent with chronic microvascular ischemic changes.  Old lacunar infarctions in the basal ganglia bilaterally are unchanged.  No definite region of acute/subacute cerebral ischemia, no evidence of acute intracerebral hemorrhage, no focal mass, mass effect, hydrocephalus or abnormal intra or extra-axial fluid collections.  Mastoids are hypoplastic but well-aerated.  Paranasal  sinuses are well aerated, with exception of some opacification of posterior left ethmoids.  IMPRESSION: 1. No definite acute intracranial abnormality. 2.  Extensive chronic microvascular ischemic changes, old bilateral basal ganglia lacunar infarctions and cerebral and cerebellar atrophy redemonstrated, as above.  Original Report Authenticated By: Florencia Reasons, M.D.   Mr Brain Wo Contrast  08/23/2011  *RADIOLOGY REPORT*  Clinical Data:  Right sided deficit upper extremity, drooping right eyelid and garbled speech which has resolved.  Dialysis patient.  MRI BRAIN WITHOUT CONTRAST MRA HEAD WITHOUT CONTRAST  Technique: Multiplanar, multiecho pulse sequences of the brain and surrounding structures were obtained according to standard protocol without intravenous contrast.  Angiographic images of the head were obtained using MRA technique without contrast.  Comparison: 08/23/2011 CT.  08/12/2010 MR.  MRI HEAD  Findings:  No acute infarct.  Remote infarcts corona radiata, thalami, basal ganglia, pons and cerebellum.  Moderate small vessel disease type changes.  No intracranial hemorrhage.  Global atrophy without hydrocephalus.  No intracranial mass lesion detected on this unenhanced exam.  Transverse ligament hypertrophy.  Partially empty sella incidentally noted.  Partial opacification mastoid air cells.  Mucosal thickening paranasal sinuses.  Orbital structures appear to be grossly intact.  IMPRESSION:  .No acute infarct.  Remote infarcts corona radiata, thalami, basal ganglia, pons and cerebellum.  Moderate small vessel disease type changes.  Global atrophy without hydrocephalus.  Partial opacification mastoid air cells. Mucosal thickening paranasal sinuses.  MRA HEAD  Findings: Ectatic distal vertical cervical segment of the internal carotid arteries bilaterally.  Mild narrowing and irregularity of the cavernous segment of the internal carotid artery bilaterally.  Mild bulge of the right periophthalmic artery  level without definitive aneurysm.  Mild narrowing mid aspect M1 segment right middle cerebral artery.  Moderate narrowing at the junction of the A1/A2 segment of the right anterior cerebral artery.  Moderate middle cerebral artery and A2 segment anterior cerebral artery branch vessel irregularity bilaterally.  Mild irregularity and narrowing of the distal vertebral arteries more notable on the right.  Nonvisualization PICAs and AICAs.  Mild irregularity of the basilar artery without high-grade stenosis.  Poor delineation distal branches of the posterior cerebral artery and superior cerebral artery.  IMPRESSION: Intracranial atherosclerotic type changes predominately involving branch vessels.  Please see above.  Original Report Authenticated By: Fuller Canada, M.D.   Mr Mra Head/brain Wo Cm  08/23/2011  *RADIOLOGY REPORT*  Clinical Data:  Right sided deficit  upper extremity, drooping right eyelid and garbled speech which has resolved.  Dialysis patient.  MRI BRAIN WITHOUT CONTRAST MRA HEAD WITHOUT CONTRAST  Technique: Multiplanar, multiecho pulse sequences of the brain and surrounding structures were obtained according to standard protocol without intravenous contrast.  Angiographic images of the head were obtained using MRA technique without contrast.  Comparison: 08/23/2011 CT.  08/12/2010 MR.  MRI HEAD  Findings:  No acute infarct.  Remote infarcts corona radiata, thalami, basal ganglia, pons and cerebellum.  Moderate small vessel disease type changes.  No intracranial hemorrhage.  Global atrophy without hydrocephalus.  No intracranial mass lesion detected on this unenhanced exam.  Transverse ligament hypertrophy.  Partially empty sella incidentally noted.  Partial opacification mastoid air cells.  Mucosal thickening paranasal sinuses.  Orbital structures appear to be grossly intact.  IMPRESSION:  .No acute infarct.  Remote infarcts corona radiata, thalami, basal ganglia, pons and cerebellum.  Moderate small  vessel disease type changes.  Global atrophy without hydrocephalus.  Partial opacification mastoid air cells. Mucosal thickening paranasal sinuses.  MRA HEAD  Findings: Ectatic distal vertical cervical segment of the internal carotid arteries bilaterally.  Mild narrowing and irregularity of the cavernous segment of the internal carotid artery bilaterally.  Mild bulge of the right periophthalmic artery level without definitive aneurysm.  Mild narrowing mid aspect M1 segment right middle cerebral artery.  Moderate narrowing at the junction of the A1/A2 segment of the right anterior cerebral artery.  Moderate middle cerebral artery and A2 segment anterior cerebral artery branch vessel irregularity bilaterally.  Mild irregularity and narrowing of the distal vertebral arteries more notable on the right.  Nonvisualization PICAs and AICAs.  Mild irregularity of the basilar artery without high-grade stenosis.  Poor delineation distal branches of the posterior cerebral artery and superior cerebral artery.  IMPRESSION: Intracranial atherosclerotic type changes predominately involving branch vessels.  Please see above.  Original Report Authenticated By: Fuller Canada, M.D.     1. TIA (transient ischemic attack)       MDM  0740-i spoke with pt and assisted RN placing pt on another bed.  He states he normally uses a walker but is barely able to stand or make a step or two to a chair with tech and i providing much more than just stabilizing assistance.  i was very concerned that the pt was going to fall.  He says his wife assists him at home and he goes to dialysis 3 times weekly.  He appears capable of only minimal ADL's without assistance. He is reportedly back to his baseline. Pt of dr/ harwani.  1040-spke with dr. Sharyn Lull who wants to see him in his office within a week and wants him re-checked by his neurologist.  Pt is currently on a statin and is taking plavix.  Dosages will stay the  same.       Evalina Field, [place] 08/24/11 1105

## 2011-08-24 NOTE — Progress Notes (Signed)
  Echocardiogram 2D Echocardiogram has been performed.  Greg [name] 08/24/2011, 9:04 AM

## 2011-08-24 NOTE — ED Notes (Signed)
Tech at bedside to assist pt to get dressed

## 2011-08-25 NOTE — ED Provider Notes (Signed)
Medical screening examination/treatment/procedure(s) were performed by non-physician practitioner and as supervising physician I was immediately available for consultation/collaboration.   Dione Booze, MD 08/25/11 [phone]

## 2011-08-26 NOTE — ED Provider Notes (Signed)
Medical screening examination/treatment/procedure(s) were conducted as a shared visit with non-physician practitioner(s) and myself.  I personally evaluated the patient during the encounter  Flint Melter, MD 08/26/11 [phone]

## 2011-12-28 ENCOUNTER — Other Ambulatory Visit (HOSPITAL_COMMUNITY): Payer: Self-pay | Admitting: Nephrology

## 2011-12-28 DIAGNOSIS — N186 End stage renal disease: Secondary | ICD-10-CM

## 2011-12-30 ENCOUNTER — Ambulatory Visit (HOSPITAL_COMMUNITY): Admission: RE | Admit: 2011-12-30 | Payer: Medicaid Other | Source: Ambulatory Visit

## 2012-01-13 ENCOUNTER — Ambulatory Visit (HOSPITAL_COMMUNITY): Admission: RE | Admit: 2012-01-13 | Payer: Medicaid Other | Source: Ambulatory Visit

## 2012-05-28 ENCOUNTER — Other Ambulatory Visit (HOSPITAL_COMMUNITY): Payer: Self-pay | Admitting: Nephrology

## 2012-05-28 DIAGNOSIS — N186 End stage renal disease: Secondary | ICD-10-CM

## 2012-06-01 ENCOUNTER — Ambulatory Visit (HOSPITAL_COMMUNITY): Payer: Medicaid Other

## 2012-06-01 ENCOUNTER — Other Ambulatory Visit (HOSPITAL_COMMUNITY): Payer: Medicaid Other

## 2012-06-06 ENCOUNTER — Ambulatory Visit (HOSPITAL_COMMUNITY): Admission: RE | Admit: 2012-06-06 | Payer: Medicaid Other | Source: Ambulatory Visit

## 2012-07-25 ENCOUNTER — Other Ambulatory Visit: Payer: Self-pay | Admitting: Gastroenterology

## 2012-07-25 DIAGNOSIS — B182 Chronic viral hepatitis C: Secondary | ICD-10-CM

## 2012-08-06 ENCOUNTER — Other Ambulatory Visit: Payer: Medicaid Other

## 2013-03-15 DIAGNOSIS — I69959 Hemiplegia and hemiparesis following unspecified cerebrovascular disease affecting unspecified side: Secondary | ICD-10-CM

## 2013-03-15 DIAGNOSIS — M6281 Muscle weakness (generalized): Secondary | ICD-10-CM

## 2013-03-15 DIAGNOSIS — Z5189 Encounter for other specified aftercare: Secondary | ICD-10-CM

## 2013-03-15 DIAGNOSIS — E119 Type 2 diabetes mellitus without complications: Secondary | ICD-10-CM

## 2013-06-07 ENCOUNTER — Other Ambulatory Visit (HOSPITAL_COMMUNITY): Payer: Self-pay | Admitting: Cardiology

## 2013-06-07 DIAGNOSIS — R079 Chest pain, unspecified: Secondary | ICD-10-CM

## 2013-06-14 ENCOUNTER — Ambulatory Visit (HOSPITAL_COMMUNITY): Payer: Medicaid Other

## 2013-06-14 ENCOUNTER — Encounter (HOSPITAL_COMMUNITY): Payer: Medicaid Other

## 2013-07-10 ENCOUNTER — Encounter (HOSPITAL_COMMUNITY): Payer: Self-pay | Admitting: Emergency Medicine

## 2013-07-10 ENCOUNTER — Emergency Department (INDEPENDENT_AMBULATORY_CARE_PROVIDER_SITE_OTHER)
Admission: EM | Admit: 2013-07-10 | Discharge: 2013-07-10 | Disposition: A | Discharge status: Home / Self Care | Payer: Medicare Other | Source: Home / Self Care | Attending: Emergency Medicine | Admitting: Emergency Medicine

## 2013-07-10 DIAGNOSIS — K5901 Slow transit constipation: Secondary | ICD-10-CM

## 2013-07-10 MED ORDER — LACTULOSE 10 GM/15ML PO SOLN: 20.0000 g | Freq: Three times a day (TID) | ORAL | Status: DC

## 2013-07-10 MED ORDER — DOCUSATE SODIUM 100 MG PO CAPS: 100.0000 mg | ORAL_CAPSULE | Freq: Two times a day (BID) | ORAL | Status: DC

## 2013-07-10 NOTE — ED Notes (Signed)
Sons bring pt in due to constipation concerns. Assess by MD only

## 2013-07-10 NOTE — ED Provider Notes (Signed)
  Chief Complaint   Chief Complaint  Patient presents with  . Constipation    History of Present Illness   Greg [name] is a 68 year old male has had a many year long history of constipation. He has multiple medical comorbidities including diabetes, hypertension, and chronic kidney disease on dialysis. He has weakness of both legs secondary to stroke and other unknown neurological issues. He's not able ambulate. Is brought in by his sons in a wheelchair today. He had been followed by his nephrologist, Dr. Darrick Penna, who had prescribed sorbitol. This worked for a while but then stopped working. His last bowel movement was 3 days ago. He denies any nausea, vomiting, abdominal pain, bloating, rectal pain, or bleeding. He is eating and drinking well.  Review of Systems   Other than as noted above, the patient denies any of the following symptoms: Constitutional:  No fever, chills, weight loss or anorexia. Abdomen:  No nausea, vomiting, hematememesis, melena, diarrhea, or hematochezia. GU:  No dysuria, frequency, urgency, or hematuria.  No testicular pain or swelling.  PMFSH   Past medical history, family history, social history, meds, and allergies were reviewed. He is on many medications including atorvastatin, carvedilol, Sensipar, Plavix, gabapentin, Humulin 70/30, and lisinopril. He has no medication allergies. Current medical problems include diabetes, chronic kidney disease, cirrhosis of the liver, alcohol abuse, and stroke. He is a current daily smoker.  Physical Examination     Vital signs:  BP 189/70  Pulse 70  Temp(Src) 98 F (36.7 C) (Oral)  Resp 20  SpO2 100% Gen:  Alert, oriented, in no distress. Lungs:  Breath sounds clear and equal bilaterally.  No wheezes, rales or rhonchi. Heart:  Regular rhythm.  No gallops or murmers.   Abdomen:  Soft, flat, nondistended. Stool is palpable in the descending and transverse colon. There is no tenderness to palpation, guarding, rebound.  Bowel sounds are normally active. Rectal exam:  Digital rectal exam reveals no impaction. No stool in the rectal ampulla. Rectal sphincter tone is diminished. There is no tenderness. No mass. Stool is heme negative. Skin:  Clear, warm and dry.  No rash.   Assessment   The encounter diagnosis was Slow transit constipation.  Plan     1.  Meds:  The following meds were prescribed:   Discharge Medication List as of 07/10/2013  2:34 PM    START taking these medications   Details  docusate sodium (COLACE) 100 MG capsule Take 1 capsule (100 mg total) by mouth 2 (two) times daily., Starting 07/10/2013, Until Discontinued, Normal    lactulose (CHRONULAC) 10 GM/15ML solution Take 30 mLs (20 g total) by mouth 3 (three) times daily., Starting 07/10/2013, Until Discontinued, Normal        2.  Patient Education/Counseling:  The patient was given appropriate handouts, self care instructions, and instructed in symptomatic relief.  I suggested getting extra fluids, holding of the calcium acetate for a few days, using a fleets enema if no bowel movement in 3 days, and followup with Dr. Darrick Penna next week.  3.  Follow up:  The patient was told to follow up here if no better in 2 to 3 days, or sooner if becoming worse in any way, and given some red flag symptoms such as worsening pain, persistent vomiting, fever, or evidence of GI bleeding which would prompt immediate return.  Follow up here if needed.      Reuben Likes, MD 07/10/13 [phone]

## 2013-07-10 NOTE — Discharge Instructions (Signed)
Constipation °Constipation is when a person has fewer than three bowel movements a week, has difficulty having a bowel movement, or has stools that are dry, hard, or larger than normal. As people grow older, constipation is more common. If you try to fix constipation with medicines that make you have a bowel movement (laxatives), the problem may get worse. Long-term laxative use may cause the muscles of the colon to become weak. A low-fiber diet, not taking in enough fluids, and taking certain medicines may make constipation worse.  °CAUSES  °· Certain medicines, such as antidepressants, pain medicine, iron supplements, antacids, and water pills.   °· Certain diseases, such as diabetes, irritable bowel syndrome (IBS), thyroid disease, or depression.   °· Not drinking enough water.   °· Not eating enough fiber-rich foods.   °· Stress or travel.   °· Lack of physical activity or exercise.   °· Ignoring the urge to have a bowel movement.   °· Using laxatives too much.   °SIGNS AND SYMPTOMS  °· Having fewer than three bowel movements a week.   °· Straining to have a bowel movement.   °· Having stools that are hard, dry, or larger than normal.   °· Feeling full or bloated.   °· Pain in the lower abdomen.   °· Not feeling relief after having a bowel movement.   °DIAGNOSIS  °Your health care provider will take a medical history and perform a physical exam. Further testing may be done for severe constipation. Some tests may include: °· A barium enema X-ray to examine your rectum, colon, and, sometimes, your small intestine.   °· A sigmoidoscopy to examine your lower colon.   °· A colonoscopy to examine your entire colon. °TREATMENT  °Treatment will depend on the severity of your constipation and what is causing it. Some dietary treatments include drinking more fluids and eating more fiber-rich foods. Lifestyle treatments may include regular exercise. If these diet and lifestyle recommendations do not help, your health care  provider may recommend taking over-the-counter laxative medicines to help you have bowel movements. Prescription medicines may be prescribed if over-the-counter medicines do not work.  °HOME CARE INSTRUCTIONS  °· Eat foods that have a lot of fiber, such as fruits, vegetables, whole grains, and beans. °· Limit foods high in fat and processed sugars, such as french fries, hamburgers, cookies, candies, and soda.   °· A fiber supplement may be added to your diet if you cannot get enough fiber from foods.   °· Drink enough fluids to keep your urine clear or pale yellow.   °· Exercise regularly or as directed by your health care provider.   °· Go to the restroom when you have the urge to go. Do not hold it.   °· Only take over-the-counter or prescription medicines as directed by your health care provider. Do not take other medicines for constipation without talking to your health care provider first.   °SEEK IMMEDIATE MEDICAL CARE IF:  °· You have bright red blood in your stool.   °· Your constipation lasts for more than 4 days or gets worse.   °· You have abdominal or rectal pain.   °· You have thin, pencil-like stools.   °· You have unexplained weight loss. °MAKE SURE YOU:  °· Understand these instructions. °· Will watch your condition. °· Will get help right away if you are not doing well or get worse. °Document Released: 09/25/2003 Document Revised: 01/01/2013 Document Reviewed: 10/08/2012 °[employer]® Patient Information ©2015 [employer]. This information is not intended to replace advice given to you by your health care provider. Make sure you discuss any questions   you have with your health care provider.   Drink extra fluids.  Hold off the calcium acetate for 1 week.  If no BM in 3 days, take Fleets Enema.

## 2013-07-11 LAB — OCCULT BLOOD, POC DEVICE: FECAL OCCULT BLD: NEGATIVE

## 2013-09-13 ENCOUNTER — Ambulatory Visit: Payer: Self-pay | Admitting: Podiatry

## 2013-09-13 ENCOUNTER — Other Ambulatory Visit (HOSPITAL_COMMUNITY): Payer: Self-pay | Admitting: Nephrology

## 2013-09-13 ENCOUNTER — Encounter (HOSPITAL_COMMUNITY): Payer: Medicare Other

## 2013-09-13 ENCOUNTER — Ambulatory Visit (HOSPITAL_COMMUNITY)
Admission: RE | Admit: 2013-09-13 | Discharge: 2013-09-13 | Disposition: A | Discharge status: Home / Self Care | Payer: Medicare Other | Source: Ambulatory Visit | Attending: Surgery | Admitting: Surgery

## 2013-09-13 DIAGNOSIS — L97529 Non-pressure chronic ulcer of other part of left foot with unspecified severity: Principal | ICD-10-CM

## 2013-09-13 DIAGNOSIS — L97519 Non-pressure chronic ulcer of other part of right foot with unspecified severity: Secondary | ICD-10-CM

## 2013-09-13 DIAGNOSIS — L97509 Non-pressure chronic ulcer of other part of unspecified foot with unspecified severity: Secondary | ICD-10-CM | POA: Diagnosis not present

## 2014-04-23 ENCOUNTER — Emergency Department (INDEPENDENT_AMBULATORY_CARE_PROVIDER_SITE_OTHER)
Admission: EM | Admit: 2014-04-23 | Discharge: 2014-04-23 | Disposition: A | Discharge status: Home / Self Care | Payer: Medicare Other | Source: Home / Self Care | Attending: Family Medicine | Admitting: Family Medicine

## 2014-04-23 ENCOUNTER — Emergency Department (INDEPENDENT_AMBULATORY_CARE_PROVIDER_SITE_OTHER): Payer: Medicare Other

## 2014-04-23 ENCOUNTER — Encounter (HOSPITAL_COMMUNITY): Payer: Self-pay | Admitting: *Deleted

## 2014-04-23 DIAGNOSIS — M869 Osteomyelitis, unspecified: Secondary | ICD-10-CM | POA: Diagnosis not present

## 2014-04-23 HISTORY — DX: Essential (primary) hypertension: I10

## 2014-04-23 MED ORDER — TETANUS-DIPHTH-ACELL PERTUSSIS 5-2.5-18.5 LF-MCG/0.5 IM SUSP
INTRAMUSCULAR | Status: AC
  Filled 2014-04-23: qty 0.5

## 2014-04-23 MED ORDER — TETANUS-DIPHTH-ACELL PERTUSSIS 5-2.5-18.5 LF-MCG/0.5 IM SUSP
0.5000 mL | Freq: Once | INTRAMUSCULAR | Status: AC
  Administered 2014-04-23: 0.5 mL via INTRAMUSCULAR

## 2014-04-23 NOTE — ED Notes (Signed)
Has a wound on R 3rd toe and L 5th toe 1 month ago-got better but then got worse 1 week ago.  Has been cleaning it but not dressing it.  Hx. Diabetes and renal failure.  Goes to dialysis T, Th and Sat.

## 2014-04-23 NOTE — ED Provider Notes (Addendum)
CSN: [id number]     Arrival date & time 04/23/14  1735 History   First MD Initiated Contact with Patient 04/23/14 1851     Chief Complaint  Patient presents with  . Diabetic Ulcer   (Consider location/radiation/quality/duration/timing/severity/associated sxs/prior Treatment) HPI Comments: Patient brought to [facility] for evaluation of non-healing wounds of right foot at 3rd toe and left foot at 5th toe. Wound at right foot has been present for at least 3 weeks and wound at left foot has been present for at least 1 week. Son states he has discussed issue with staff at patient's dialysis center and has requested referral to wound care center on several occasions. States center has yet to initiate referral and when he spoke with center today, was informed that it would likely be another week before patient could be seen and evaluated. Son is concerned about progression of wound and concerned regarding infection. Patient does not speak during exam, therefore, history obtained from family members. No reports of fever. Son mentions that patient no longer has sensation in either of his feet secondary to severe diabetic neuropathy.  PCP: Dr. Sharyn Lull  The history is provided by a relative.    Past Medical History  Diagnosis Date  . Diabetes mellitus   . Renal disorder     HD patient  . Cirrhosis   . Alcohol abuse   . Stroke     left sided deficits  . Hypertension    Past Surgical History  Procedure Laterality Date  . Eye surgery    . Av fistula placement  2006    L upper arm   History reviewed. No pertinent family history. History  Substance Use Topics  . Smoking status: Former Smoker    Quit date: 01/10/2006  . Smokeless tobacco: Not on file  . Alcohol Use: No     Comment: Former    Review of Systems  Unable to perform ROS: Patient nonverbal    Allergies  Review of patient's allergies indicates no known allergies.  Home Medications   Prior to Admission medications   Medication  Sig Start Date End Date Taking? Authorizing Provider  atorvastatin (LIPITOR) 40 MG tablet Take 40 mg by mouth at bedtime.   Yes Historical Provider, MD  cinacalcet (SENSIPAR) 30 MG tablet Take 30 mg by mouth at bedtime.   Yes Historical Provider, MD  clopidogrel (PLAVIX) 75 MG tablet Take 75 mg by mouth daily.   Yes Historical Provider, MD  gabapentin (NEURONTIN) 100 MG capsule Take 100 mg by mouth at bedtime.   Yes Historical Provider, MD  insulin NPH-insulin regular (HUMULIN 70/30) (70-30) 100 UNIT/ML injection Inject 30-35 Units into the skin 2 (two) times daily with a meal. Takes 35 units in the am & 30 units in the pm   Yes Historical Provider, MD  Multiple Vitamin (MULTIVITAMIN WITH MINERALS) TABS Take 1 tablet by mouth daily.   Yes Historical Provider, MD  carvedilol (COREG CR) 10 MG 24 hr capsule Take 10 mg by mouth 3 (three) times daily. Takes on dialysis days-Tues, Thurs & Sat    Historical Provider, MD  carvedilol (COREG CR) 20 MG 24 hr capsule Take 20 mg by mouth 4 (four) times a week. Takes on non dialysis days-Mon, Wed, Fri & Sun    Historical Provider, MD  docusate sodium (COLACE) 100 MG capsule Take 1 capsule (100 mg total) by mouth 2 (two) times daily. 07/10/13   Reuben Likes, MD  lactulose [facility]) 10 GM/15ML solution Take  30 mLs (20 g total) by mouth 3 (three) times daily. 07/10/13   Reuben Likes, MD  lisinopril (PRINIVIL,ZESTRIL) 5 MG tablet Take 5 mg by mouth daily.    Historical Provider, MD   BP 190/76 mmHg  Pulse 60  Temp(Src) 97.8 F (36.6 C) (Oral)  Resp 16  SpO2 98% Physical Exam  Constitutional: He is cooperative.  Appears frail  HENT:  Head: Normocephalic and atraumatic.  Cardiovascular: Normal rate.   Pulmonary/Chest: Effort normal.  Musculoskeletal:       Right foot: There is decreased range of motion, swelling and decreased capillary refill.       Left foot: There is decreased range of motion.       Feet:  Neurological: He is alert.  Nursing note and  vitals reviewed.   ED Course  Procedures (including critical care time) Labs Review Labs Reviewed - No data to display  Imaging Review Dg Foot Complete Right  04/23/2014   CLINICAL DATA:  Three-week history of ulcer along third digit region  EXAM: RIGHT FOOT COMPLETE - 3+ VIEW  COMPARISON:  None.  FINDINGS: Frontal, oblique, and lateral views obtained. There is osteomyelitis involving the proximal mid portions of the third distal phalanx. There is old trauma involving the distal aspect of the third middle phalanx. No other bony destruction. No acute fracture or dislocation. Joint spaces appear intact. Bones are osteoporotic. There is extensive vascular calcification.  IMPRESSION: Bony destruction involving portions of the third distal phalanx. Old trauma involving the distal aspect of the third middle phalanx. No acute fracture or dislocation. Bones osteoporotic. Extensive arterial vascular calcification consistent with known diabetes mellitus.   Electronically Signed   By: Bretta Bang III M.D.   On: 04/23/2014 20:06     MDM   1. Osteomyelitis of foot   Patient to be transported to [facility] ER by family for further evaluation/management and possible admission for above.    Ria Clock, [place] 04/23/14 2031  04/28/2014: (Addendum) Patient received Tdap booster on DOS. (Open wound and tetanus status unknown).  Ria Clock, [place] 04/28/14 [phone]

## 2014-04-23 NOTE — Discharge Instructions (Signed)
The infection at your right third toe appear to involve the bones of the toe. This can be serious and cause significant further infection. You may need to be admitted to the hospital for management. Please report directly to [facility] ER for further evaluation and management.

## 2014-04-24 ENCOUNTER — Inpatient Hospital Stay (HOSPITAL_COMMUNITY)
Admission: EM | Admit: 2014-04-24 | Discharge: 2014-04-25 | DRG: 539 | Disposition: A | Discharge status: Home / Self Care | Payer: Medicare Other | Attending: Internal Medicine | Admitting: Internal Medicine

## 2014-04-24 ENCOUNTER — Encounter (HOSPITAL_COMMUNITY): Payer: Self-pay | Admitting: Emergency Medicine

## 2014-04-24 ENCOUNTER — Emergency Department (HOSPITAL_COMMUNITY): Payer: Medicare Other

## 2014-04-24 DIAGNOSIS — Z79899 Other long term (current) drug therapy: Secondary | ICD-10-CM

## 2014-04-24 DIAGNOSIS — E11628 Type 2 diabetes mellitus with other skin complications: Secondary | ICD-10-CM | POA: Diagnosis present

## 2014-04-24 DIAGNOSIS — L97519 Non-pressure chronic ulcer of other part of right foot with unspecified severity: Secondary | ICD-10-CM | POA: Diagnosis present

## 2014-04-24 DIAGNOSIS — Z992 Dependence on renal dialysis: Secondary | ICD-10-CM | POA: Diagnosis not present

## 2014-04-24 DIAGNOSIS — Z7902 Long term (current) use of antithrombotics/antiplatelets: Secondary | ICD-10-CM | POA: Diagnosis not present

## 2014-04-24 DIAGNOSIS — Z7982 Long term (current) use of aspirin: Secondary | ICD-10-CM

## 2014-04-24 DIAGNOSIS — F1010 Alcohol abuse, uncomplicated: Secondary | ICD-10-CM | POA: Diagnosis present

## 2014-04-24 DIAGNOSIS — M869 Osteomyelitis, unspecified: Principal | ICD-10-CM | POA: Diagnosis present

## 2014-04-24 DIAGNOSIS — K7460 Unspecified cirrhosis of liver: Secondary | ICD-10-CM | POA: Diagnosis present

## 2014-04-24 DIAGNOSIS — Z794 Long term (current) use of insulin: Secondary | ICD-10-CM | POA: Diagnosis not present

## 2014-04-24 DIAGNOSIS — Z8673 Personal history of transient ischemic attack (TIA), and cerebral infarction without residual deficits: Secondary | ICD-10-CM

## 2014-04-24 DIAGNOSIS — Z87891 Personal history of nicotine dependence: Secondary | ICD-10-CM | POA: Diagnosis not present

## 2014-04-24 DIAGNOSIS — L089 Local infection of the skin and subcutaneous tissue, unspecified: Secondary | ICD-10-CM | POA: Diagnosis present

## 2014-04-24 DIAGNOSIS — I120 Hypertensive chronic kidney disease with stage 5 chronic kidney disease or end stage renal disease: Secondary | ICD-10-CM | POA: Diagnosis present

## 2014-04-24 DIAGNOSIS — E119 Type 2 diabetes mellitus without complications: Secondary | ICD-10-CM | POA: Diagnosis present

## 2014-04-24 DIAGNOSIS — I96 Gangrene, not elsewhere classified: Secondary | ICD-10-CM | POA: Diagnosis present

## 2014-04-24 DIAGNOSIS — N186 End stage renal disease: Secondary | ICD-10-CM | POA: Diagnosis present

## 2014-04-24 LAB — CBC WITH DIFFERENTIAL/PLATELET
Basophils Absolute: 0 10*3/uL (ref 0.0–0.1)
Basophils Relative: 0 % (ref 0–1)
Eosinophils Absolute: 0.1 10*3/uL (ref 0.0–0.7)
Eosinophils Relative: 1 % (ref 0–5)
HCT: 35.8 % — ABNORMAL LOW (ref 39.0–52.0)
Hemoglobin: 12.2 g/dL — ABNORMAL LOW (ref 13.0–17.0)
Lymphocytes Relative: 15 % (ref 12–46)
Lymphs Abs: 1 10*3/uL (ref 0.7–4.0)
MCH: 28.8 pg (ref 26.0–34.0)
MCHC: 34.1 g/dL (ref 30.0–36.0)
MCV: 84.4 fL (ref 78.0–100.0)
Monocytes Absolute: 0.5 10*3/uL (ref 0.1–1.0)
Monocytes Relative: 7 % (ref 3–12)
Neutro Abs: 4.9 10*3/uL (ref 1.7–7.7)
Neutrophils Relative %: 77 % (ref 43–77)
Platelets: 68 10*3/uL — ABNORMAL LOW (ref 150–400)
RBC: 4.24 MIL/uL (ref 4.22–5.81)
RDW: 14.3 % (ref 11.5–15.5)
WBC: 6.5 10*3/uL (ref 4.0–10.5)

## 2014-04-24 LAB — SEDIMENTATION RATE: SED RATE: 60 mm/h — AB (ref 0–16)

## 2014-04-24 LAB — BASIC METABOLIC PANEL
Anion gap: 12 (ref 5–15)
BUN: 38 mg/dL — ABNORMAL HIGH (ref 6–23)
CO2: 29 mmol/L (ref 19–32)
Calcium: 9.2 mg/dL (ref 8.4–10.5)
Chloride: 95 mmol/L — ABNORMAL LOW (ref 96–112)
Creatinine, Ser: 5.66 mg/dL — ABNORMAL HIGH (ref 0.50–1.35)
GFR calc Af Amer: 11 mL/min — ABNORMAL LOW (ref >90)
GFR calc non Af Amer: 9 mL/min — ABNORMAL LOW (ref >90)
Glucose, Bld: 149 mg/dL — ABNORMAL HIGH (ref 70–99)
Potassium: 4.6 mmol/L (ref 3.5–5.1)
Sodium: 136 mmol/L (ref 135–145)

## 2014-04-24 LAB — CK: Total CK: 42 U/L (ref 7–232)

## 2014-04-24 MED ORDER — HYDRALAZINE HCL 20 MG/ML IJ SOLN: 10.0000 mg | INTRAMUSCULAR | Status: DC | PRN

## 2014-04-24 MED ORDER — VANCOMYCIN HCL 10 G IV SOLR
1500.0000 mg | Freq: Once | INTRAVENOUS | Status: AC
Start: 2014-04-24 — End: 2014-04-25
  Administered 2014-04-24: 1500 mg via INTRAVENOUS
  Filled 2014-04-24: qty 1500

## 2014-04-24 MED ORDER — CLINDAMYCIN HCL 150 MG PO CAPS: 450.0000 mg | ORAL_CAPSULE | Freq: Three times a day (TID) | ORAL | Status: DC

## 2014-04-24 MED ORDER — BACITRACIN ZINC 500 UNIT/GM EX OINT
TOPICAL_OINTMENT | CUTANEOUS | Status: AC
  Filled 2014-04-24: qty 1.8

## 2014-04-24 MED ORDER — PIPERACILLIN-TAZOBACTAM 3.375 G IVPB
3.3750 g | Freq: Once | INTRAVENOUS | Status: AC
  Administered 2014-04-24: 3.375 g via INTRAVENOUS
  Filled 2014-04-24: qty 50

## 2014-04-24 NOTE — ED Notes (Signed)
Below orders not completed by EW. 

## 2014-04-24 NOTE — ED Notes (Signed)
Dr. Patel at bedside 

## 2014-04-24 NOTE — ED Notes (Addendum)
Nurse and tech will draw labs

## 2014-04-24 NOTE — Progress Notes (Signed)
ANTIBIOTIC CONSULT NOTE - INITIAL  Pharmacy Consult for vancomycin Indication: Osteomyelitis  No Known Allergies  Patient Measurements: weight  68 kg, height 69 inches (per family)   Vital Signs: Temp: 97.5 F (36.4 C) (04/14 1844) Temp Source: Oral (04/14 1844) BP: 180/90 mmHg (04/14 1844) Pulse Rate: 73 (04/14 1844) Intake/Output from previous day:   Intake/Output from this shift:    Labs:  Recent Labs  04/24/14 1726  WBC 6.5  HGB 12.2*  PLT 68*  CREATININE 5.66*   CrCl cannot be calculated (Unknown ideal weight.). No results for input(s): VANCOTROUGH, VANCOPEAK, VANCORANDOM, GENTTROUGH, GENTPEAK, GENTRANDOM, TOBRATROUGH, TOBRAPEAK, TOBRARND, AMIKACINPEAK, AMIKACINTROU, AMIKACIN in the last 72 hours.   Microbiology: No results found for this or any previous visit (from the past 720 hour(s)).  Medical History: Past Medical History  Diagnosis Date  . Diabetes mellitus   . Renal disorder     HD patient  . Cirrhosis   . Alcohol abuse   . Stroke     left sided deficits  . Hypertension     Medications:  See med rec   Assessment: Patient is a 69 y.o. M with ESRD on HD (TTSat) with  hx DM and R 3rd toe and L 5th toe wounds.  To start vancomycin for wound infection and r/o osteomyelitis.  Patient's family reported that he did not get dialyze today and was not on antibiotic PTA.  4/14 L foor xray: no osteo noted 4/13 R foot xray: bony destruction noted in third distal phalanx   Goal of Therapy:  Pre-HD vancomycin level 15-25  Plan:  - vancomycin 1500mg  IV x1 load - will f/u with HD schedule and enter maintenance vancomycin dose  Lamarr Feenstra P 04/24/2014,8:29 PM

## 2014-04-24 NOTE — ED Notes (Signed)
Pt/family requesting to leave AMA once antibiotics are completed per Dr. Allena Katz - [place], [facility] updated.

## 2014-04-24 NOTE — Discharge Instructions (Signed)
Follow-up tomorrow with Dr. Sharyn Lull.  Return here as needed

## 2014-04-24 NOTE — ED Notes (Addendum)
Pt c/o  Wounds on L and R feet x 1 month. Pt has appointment with wound specialist this coming week but family concerned about the state of the wounds at this point. Family sts R foot is worse than the left. Pt is a diabetic. A&Ox4. Pt is on dialysis.

## 2014-04-24 NOTE — Progress Notes (Signed)
[facility] PROGRESS NOTE  Greg [name]  OIB:[id number]  DOB: [birth year]  DOS: 04/24/2014  PCP: Robynn Pane, MD  Patient was initially discussed over the phone and accepted as an admission for osteomyelitis. Initial orders are placed for patient to be transferred to [facility] cone and admitted there, as well as initial blood work. On history before examination, the patient mentioned that his PCP is Dr. Sharyn Lull, and therefore patient's care was transferred back to ER physician Greg [name], and Dr. Jeraldine Loots, and I recommended him to discuss with Dr. Sharyn Lull, for admission and further management for the patient.  Author: Lynden Oxford, MD [facility] Pager: [phone] 04/24/2014 9:56 PM   If 7PM-7AM, please contact night-coverage at [email or web address], password [facility]

## 2014-04-24 NOTE — ED Provider Notes (Signed)
CSN: [id number]     Arrival date & time 04/24/14  1559 History   First MD Initiated Contact with Patient 04/24/14 1640     Chief Complaint  Patient presents with  . Wound Infection     (Consider location/radiation/quality/duration/timing/severity/associated sxs/prior Treatment) HPI Patient presents to the emergency department with wounds to his third digit on his right foot and the fifth digit on the left foot.  The patient, states she has been ongoing for greater than 1 month.  Patient went to [place] on a trip and when he returned.  The wounds were worse.  He saw a physician from the urgent care  who referred him to wound care.  Patient denies fever, nausea, vomiting, weakness, dizziness, headache, blurred vision, back pain, neck pain, cough, rhinorrhea, sore throat, or syncope Past Medical History  Diagnosis Date  . Diabetes mellitus   . Renal disorder     HD patient  . Cirrhosis   . Alcohol abuse   . Stroke     left sided deficits  . Hypertension    Past Surgical History  Procedure Laterality Date  . Eye surgery    . Av fistula placement  2006    L upper arm   No family history on file. History  Substance Use Topics  . Smoking status: Former Smoker    Quit date: 01/10/2006  . Smokeless tobacco: Not on file  . Alcohol Use: No     Comment: Former    Review of Systems  All other systems negative except as documented in the HPI. All pertinent positives and negatives as reviewed in the HPI.  Allergies  Review of patient's allergies indicates no known allergies.  Home Medications   Prior to Admission medications   Medication Sig Start Date End Date Taking? Authorizing Provider  aspirin 325 MG EC tablet Take 325 mg by mouth daily.   Yes Historical Provider, MD  atorvastatin (LIPITOR) 40 MG tablet Take 40 mg by mouth at bedtime.   Yes Historical Provider, MD  calcium acetate (PHOSLO) 667 MG capsule Take 2,001 mg by mouth See admin instructions. 3 capsules three times  daily with meals and one capsule with snacks twice daily 03/26/14  Yes Historical Provider, MD  carvedilol (COREG CR) 10 MG 24 hr capsule Take 10 mg by mouth 3 (three) times daily. Takes on days he does not have dialysis Monday Wednesday Friday Sunday   Yes Historical Provider, MD  clopidogrel (PLAVIX) 75 MG tablet Take 75 mg by mouth daily.   Yes Historical Provider, MD  insulin NPH-insulin regular (HUMULIN 70/30) (70-30) 100 UNIT/ML injection Inject 10 Units into the skin daily.    Yes Historical Provider, MD  docusate sodium (COLACE) 100 MG capsule Take 1 capsule (100 mg total) by mouth 2 (two) times daily. Patient not taking: Reported on 04/24/2014 07/10/13   Reuben Likes, MD  lactulose (CHRONULAC) 10 GM/15ML solution Take 30 mLs (20 g total) by mouth 3 (three) times daily. Patient not taking: Reported on 04/24/2014 07/10/13   Reuben Likes, MD   BP 208/86 mmHg  Pulse 80  Temp(Src) 97.9 F (36.6 C) (Oral)  Resp 16  Ht 5\' 9"  (1.753 m)  Wt 172 lb (78.019 kg)  BMI 25.39 kg/m2  SpO2 100% Physical Exam  Constitutional: He is oriented to person, place, and time. He appears well-developed and well-nourished.  HENT:  Head: Normocephalic and atraumatic.  Mouth/Throat: Oropharynx is clear and moist.  Eyes: Pupils are equal, round, and reactive  to light.  Cardiovascular: Normal rate, regular rhythm and normal heart sounds.  Exam reveals no gallop and no friction rub.   No murmur heard. Pulmonary/Chest: Effort normal and breath sounds normal. No respiratory distress.  Musculoskeletal:       Feet:  Neurological: He is alert and oriented to person, place, and time. He exhibits normal muscle tone. Coordination normal.  Skin: Skin is warm and dry. No rash noted. No erythema.    ED Course  Procedures (including critical care time) Labs Review Labs Reviewed  BASIC METABOLIC PANEL - Abnormal; Notable for the following:    Chloride 95 (*)    Glucose, Bld 149 (*)    BUN 38 (*)    Creatinine, Ser  5.66 (*)    GFR calc non Af Amer 9 (*)    GFR calc Af Amer 11 (*)    All other components within normal limits  CBC WITH DIFFERENTIAL/PLATELET - Abnormal; Notable for the following:    Hemoglobin 12.2 (*)    HCT 35.8 (*)    Platelets 68 (*)    All other components within normal limits  SEDIMENTATION RATE - Abnormal; Notable for the following:    Sed Rate 60 (*)    All other components within normal limits  CULTURE, BLOOD (ROUTINE X 2)  CULTURE, BLOOD (ROUTINE X 2)  CK  C-REACTIVE PROTEIN  HEMOGLOBIN A1C    Imaging Review Dg Foot Complete Left  04/24/2014   CLINICAL DATA:  Left foot osteomyelitis  EXAM: LEFT FOOT - COMPLETE 3+ VIEW  COMPARISON:  None.  FINDINGS: No fracture or dislocation is seen.  The joint spaces are preserved.  Mild soft tissue swelling along the hindfoot.  Vascular calcifications.  No cortical destruction to suggest osteomyelitis.  IMPRESSION: No radiographic findings to suggest osteomyelitis.   Electronically Signed   By: Charline Bills M.D.   On: 04/24/2014 20:19   Dg Foot Complete Right  04/23/2014   CLINICAL DATA:  Three-week history of ulcer along third digit region  EXAM: RIGHT FOOT COMPLETE - 3+ VIEW  COMPARISON:  None.  FINDINGS: Frontal, oblique, and lateral views obtained. There is osteomyelitis involving the proximal mid portions of the third distal phalanx. There is old trauma involving the distal aspect of the third middle phalanx. No other bony destruction. No acute fracture or dislocation. Joint spaces appear intact. Bones are osteoporotic. There is extensive vascular calcification.  IMPRESSION: Bony destruction involving portions of the third distal phalanx. Old trauma involving the distal aspect of the third middle phalanx. No acute fracture or dislocation. Bones osteoporotic. Extensive arterial vascular calcification consistent with known diabetes mellitus.   Electronically Signed   By: Bretta Bang III M.D.   On: 04/23/2014 20:06     I  initially spoke with the [facility] who will admit the patient and the patient decided in the family.  They want to follow-up tomorrow with Dr. Sharyn Lull the patient will be discharged AMA as he does have significant gangrenous looking toes  Charlestine Night, PA-C 04/24/14 2337  Gerhard Munch, MD 04/25/14 [phone]

## 2014-04-25 LAB — C-REACTIVE PROTEIN: CRP: 0.7 mg/dL — ABNORMAL HIGH (ref <0.60)

## 2014-04-25 NOTE — ED Notes (Signed)
D/c instructions reviewed w/ pt and family - pt and family deny any further questions or concerns at present. Rx given x1  

## 2014-04-28 LAB — HEMOGLOBIN A1C
HEMOGLOBIN A1C: 6.3 % — AB (ref 4.8–5.6)
MEAN PLASMA GLUCOSE: 134 mg/dL

## 2014-05-01 ENCOUNTER — Encounter
Admit: 2014-05-01 | Disposition: A | Discharge status: Home / Self Care | Payer: Self-pay | Attending: Surgery | Admitting: Surgery

## 2014-05-01 LAB — CULTURE, BLOOD (ROUTINE X 2)
Culture: NO GROWTH
Culture: NO GROWTH

## 2014-05-06 ENCOUNTER — Other Ambulatory Visit: Payer: Self-pay

## 2014-05-06 DIAGNOSIS — L97509 Non-pressure chronic ulcer of other part of unspecified foot with unspecified severity: Secondary | ICD-10-CM

## 2014-05-06 LAB — SURGICAL PATHOLOGY

## 2014-05-07 ENCOUNTER — Ambulatory Visit (HOSPITAL_COMMUNITY)
Admission: RE | Admit: 2014-05-07 | Discharge: 2014-05-07 | Disposition: A | Discharge status: Home / Self Care | Payer: Medicare Other | Source: Ambulatory Visit | Attending: Vascular Surgery | Admitting: Vascular Surgery

## 2014-05-07 DIAGNOSIS — L97509 Non-pressure chronic ulcer of other part of unspecified foot with unspecified severity: Secondary | ICD-10-CM | POA: Insufficient documentation

## 2014-05-13 ENCOUNTER — Encounter (HOSPITAL_BASED_OUTPATIENT_CLINIC_OR_DEPARTMENT_OTHER): Payer: Medicare Other | Attending: General Surgery

## 2014-05-13 ENCOUNTER — Encounter: Payer: Self-pay | Admitting: Vascular Surgery

## 2014-05-13 DIAGNOSIS — E11621 Type 2 diabetes mellitus with foot ulcer: Secondary | ICD-10-CM | POA: Insufficient documentation

## 2014-05-13 DIAGNOSIS — N186 End stage renal disease: Secondary | ICD-10-CM | POA: Insufficient documentation

## 2014-05-13 DIAGNOSIS — Z794 Long term (current) use of insulin: Secondary | ICD-10-CM | POA: Insufficient documentation

## 2014-05-13 DIAGNOSIS — B9689 Other specified bacterial agents as the cause of diseases classified elsewhere: Secondary | ICD-10-CM | POA: Insufficient documentation

## 2014-05-13 DIAGNOSIS — E1122 Type 2 diabetes mellitus with diabetic chronic kidney disease: Secondary | ICD-10-CM | POA: Insufficient documentation

## 2014-05-13 DIAGNOSIS — M868X7 Other osteomyelitis, ankle and foot: Secondary | ICD-10-CM | POA: Insufficient documentation

## 2014-05-13 DIAGNOSIS — E1152 Type 2 diabetes mellitus with diabetic peripheral angiopathy with gangrene: Secondary | ICD-10-CM | POA: Insufficient documentation

## 2014-05-13 DIAGNOSIS — I120 Hypertensive chronic kidney disease with stage 5 chronic kidney disease or end stage renal disease: Secondary | ICD-10-CM | POA: Insufficient documentation

## 2014-05-13 DIAGNOSIS — Z992 Dependence on renal dialysis: Secondary | ICD-10-CM | POA: Insufficient documentation

## 2014-05-13 DIAGNOSIS — E1140 Type 2 diabetes mellitus with diabetic neuropathy, unspecified: Secondary | ICD-10-CM | POA: Insufficient documentation

## 2014-05-13 DIAGNOSIS — L97521 Non-pressure chronic ulcer of other part of left foot limited to breakdown of skin: Secondary | ICD-10-CM | POA: Insufficient documentation

## 2014-05-13 DIAGNOSIS — Z9119 Patient's noncompliance with other medical treatment and regimen: Secondary | ICD-10-CM | POA: Insufficient documentation

## 2014-05-13 DIAGNOSIS — L97511 Non-pressure chronic ulcer of other part of right foot limited to breakdown of skin: Secondary | ICD-10-CM | POA: Insufficient documentation

## 2014-05-13 DIAGNOSIS — E1136 Type 2 diabetes mellitus with diabetic cataract: Secondary | ICD-10-CM | POA: Insufficient documentation

## 2014-05-14 ENCOUNTER — Ambulatory Visit (INDEPENDENT_AMBULATORY_CARE_PROVIDER_SITE_OTHER): Payer: Medicare Other | Admitting: Vascular Surgery

## 2014-05-14 ENCOUNTER — Other Ambulatory Visit: Payer: Self-pay

## 2014-05-14 ENCOUNTER — Encounter: Payer: Self-pay | Admitting: Vascular Surgery

## 2014-05-14 VITALS — BP 191/81 | HR 60 | Resp 18 | Ht 69.0 in | Wt 160.0 lb

## 2014-05-14 DIAGNOSIS — I739 Peripheral vascular disease, unspecified: Secondary | ICD-10-CM | POA: Diagnosis not present

## 2014-05-14 NOTE — Progress Notes (Signed)
[facility] [place] HISTORY AND PHYSICAL   History of Present Illness:  Patient is a 69 y.o. year old male who presents for evaluation of bilateral nonhealing toe wounds.  The patient developed ulcerations on toes on both feet after a recent trip to [place] thought to be primarily due to poor fitting shoes. The wounds have been slowly progressively worse. He has not had prior episodes.  The wounds have now been present for approximately one month. He denies any fever or chills. He overall is not very active. He spends most of his day in a wheelchair. He is overall fairly deconditioned. He apparently is able to stand and transfer with some assistance.  Other medical problems include diabetes, end-stage renal disease (Tuesday Thursday Saturday dialysis), cirrhosis from prior alcohol abuse and hepatitis C, hypertension all are currently stable. He has had a prior stroke with left-sided deficits but his son states that currently he is basically just weak everywhere. The patient does not speak English very well and some interpretation was used from the son today. The patient does seem to understand and comprehend but is not able to speak and was very well. He is on aspirin and Plavix.  Past Medical History  Diagnosis Date  . Diabetes mellitus   . Renal disorder     HD patient  . Cirrhosis   . Alcohol abuse   . Stroke     left sided deficits  . Hypertension     Past Surgical History  Procedure Laterality Date  . Eye surgery    . Av fistula placement  2006    L upper arm    Social History History  Substance Use Topics  . Smoking status: Former Smoker    Quit date: 01/10/2006  . Smokeless tobacco: Never Used  . Alcohol Use: No     Comment: Former    Family History Family History  Problem Relation Age of Onset  . Diabetes Mother   . Hypertension Mother   . Diabetes Father   . Hypertension Father     Allergies  No Known Allergies   Current Outpatient  Prescriptions  Medication Sig Dispense Refill  . aspirin 325 MG EC tablet Take 325 mg by mouth daily.    Marland Kitchen atorvastatin (LIPITOR) 40 MG tablet Take 40 mg by mouth at bedtime.    . calcium acetate (PHOSLO) 667 MG capsule Take 2,001 mg by mouth See admin instructions. 3 capsules three times daily with meals and one capsule with snacks twice daily  0  . carvedilol (COREG CR) 10 MG 24 hr capsule Take 10 mg by mouth 3 (three) times daily. Takes on days he does not have dialysis Monday Wednesday Friday Sunday    . clindamycin (CLEOCIN) 150 MG capsule Take 3 capsules (450 mg total) by mouth 3 (three) times daily. (Patient not taking: Reported on 05/14/2014) 90 capsule 0  . clopidogrel (PLAVIX) 75 MG tablet Take 75 mg by mouth daily.    Marland Kitchen docusate sodium (COLACE) 100 MG capsule Take 1 capsule (100 mg total) by mouth 2 (two) times daily. 10 capsule 0  . insulin NPH-insulin regular (HUMULIN 70/30) (70-30) 100 UNIT/ML injection Inject 10 Units into the skin daily.     Marland Kitchen lactulose (CHRONULAC) 10 GM/15ML solution Take 30 mLs (20 g total) by mouth 3 (three) times daily. (Patient not taking: Reported on 04/24/2014) 960 mL 3   No current facility-administered medications for this visit.    ROS:   General:  No weight loss, Fever, chills  HEENT: No recent headaches, no nasal bleeding, no visual changes, no sore throat  Neurologic: No dizziness, blackouts, seizures. No recent symptoms of stroke or mini- stroke. No recent episodes of slurred speech, or temporary blindness.  Cardiac: No recent episodes of chest pain/pressure, no shortness of breath at rest.  + shortness of breath with exertion.  Denies history of atrial fibrillation or irregular heartbeat  Vascular: No history of rest pain in feet.  No history of claudication.  No history of non-healing ulcer, No history of DVT   Pulmonary: No home oxygen, no productive cough, no hemoptysis,  No asthma or wheezing  Musculoskeletal:  [ ]  Arthritis, [ ]  Low back  pain,  [ ]  Joint pain  Hematologic:No history of hypercoagulable state.  No history of easy bleeding.  No history of anemia  Gastrointestinal: No hematochezia or melena,  No gastroesophageal reflux, no trouble swallowing  Urinary: [x ] chronic Kidney disease, [x ] on HD - [ ]  MWF or [x ] TTHS, [ ]  Burning with urination, [ ]  Frequent urination, [ ]  Difficulty urinating;   Skin: No rashes  Psychological: No history of anxiety,  No history of depression   Physical Examination  Filed Vitals:   05/14/14 1027 05/14/14 1029  BP: 185/75 191/81  Pulse: 61 60  Resp: 18   Height: 5\' 9"  (1.753 m)   Weight: 160 lb (72.576 kg)     Body mass index is 23.62 kg/(m^2).  General:  Alert and oriented, no acute distress, patient sitting in a wheelchair overall appears fairly weak and frail HEENT: Normal Neck: No bruit or JVD Pulmonary: Clear to auscultation bilaterally Cardiac: Regular Rate and Rhythm diffuse systolic murmur heard throughout the chest possibly related to his left arm AV fistula Abdomen: Soft, non-tender, non-distended, no mass Skin: No rash, early dry gangrenous changes of left fifth toe and right third and fourth toes distally no erythema no significant drainage no fluctuance Extremity Pulses:  2+ radial, brachial, femoral, absent popliteal dorsalis pedis, posterior tibial pulses bilaterally Musculoskeletal: No deformity or edema  Neurologic: Upper and lower extremity motor 5/5 and symmetric  DATA:  Patient had an arterial duplex scan performed on April 27. This showed biphasic waveforms in the common femoral bilaterally. There were monophasic waveforms below the SFA on the left area vessels were noncompressible. Multiple segments of greater than 50% stenosis diffusely through both lower extremities.   ASSESSMENT:  Dry gangrenous changes toes bilateral feet. Patient is at risk of limb loss. He has evidence of arterial occlusive disease on physical exam and objective noninvasive  exam.   PLAN:  Aortogram with bilateral lower extremity runoff possible intervention on Friday, 05/26/2014. Risks benefits possible complications and procedure details were explained to the patient and his son today. These include but are not limited to bleeding infection contrast reaction vessel injury. They understand and agree to proceed. If the patient does not have a percutaneous revascularization option. He probably is not going to be a very good candidate for bypass operation. I told the son this today. We will have further discussions regarding treatment options if he is not a percutaneous candidate.  Fabienne Bruns, MD [facility] [place] Office: [phone] Pager: [phone]

## 2014-05-14 NOTE — Progress Notes (Signed)
Filed Vitals:   05/14/14 1027 05/14/14 1029  BP: 185/75 191/81  Pulse: 61 60  Resp: 18   Height: 5\' 9"  (1.753 m)   Weight: 160 lb (72.576 kg)    Body mass index is 23.62 kg/(m^2).

## 2014-05-16 ENCOUNTER — Encounter (HOSPITAL_COMMUNITY): Admission: RE | Payer: Self-pay | Source: Ambulatory Visit

## 2014-05-16 ENCOUNTER — Ambulatory Visit (HOSPITAL_COMMUNITY): Admission: RE | Admit: 2014-05-16 | Payer: Medicare Other | Source: Ambulatory Visit | Admitting: Vascular Surgery

## 2014-05-16 SURGERY — ABDOMINAL AORTOGRAM W/LOWER EXTREMITY
Anesthesia: LOCAL

## 2014-05-21 DIAGNOSIS — E1122 Type 2 diabetes mellitus with diabetic chronic kidney disease: Secondary | ICD-10-CM | POA: Diagnosis not present

## 2014-05-21 DIAGNOSIS — L97511 Non-pressure chronic ulcer of other part of right foot limited to breakdown of skin: Secondary | ICD-10-CM | POA: Diagnosis present

## 2014-05-21 DIAGNOSIS — Z794 Long term (current) use of insulin: Secondary | ICD-10-CM | POA: Diagnosis not present

## 2014-05-21 DIAGNOSIS — L97521 Non-pressure chronic ulcer of other part of left foot limited to breakdown of skin: Secondary | ICD-10-CM | POA: Diagnosis present

## 2014-05-21 DIAGNOSIS — B9689 Other specified bacterial agents as the cause of diseases classified elsewhere: Secondary | ICD-10-CM | POA: Diagnosis not present

## 2014-05-21 DIAGNOSIS — Z992 Dependence on renal dialysis: Secondary | ICD-10-CM | POA: Diagnosis not present

## 2014-05-21 DIAGNOSIS — M868X7 Other osteomyelitis, ankle and foot: Secondary | ICD-10-CM | POA: Diagnosis not present

## 2014-05-21 DIAGNOSIS — E11621 Type 2 diabetes mellitus with foot ulcer: Secondary | ICD-10-CM | POA: Diagnosis not present

## 2014-05-21 DIAGNOSIS — Z9119 Patient's noncompliance with other medical treatment and regimen: Secondary | ICD-10-CM | POA: Diagnosis not present

## 2014-05-21 DIAGNOSIS — N186 End stage renal disease: Secondary | ICD-10-CM | POA: Diagnosis not present

## 2014-05-21 DIAGNOSIS — E1152 Type 2 diabetes mellitus with diabetic peripheral angiopathy with gangrene: Secondary | ICD-10-CM | POA: Diagnosis not present

## 2014-05-21 DIAGNOSIS — I120 Hypertensive chronic kidney disease with stage 5 chronic kidney disease or end stage renal disease: Secondary | ICD-10-CM | POA: Diagnosis not present

## 2014-05-21 DIAGNOSIS — E1140 Type 2 diabetes mellitus with diabetic neuropathy, unspecified: Secondary | ICD-10-CM | POA: Diagnosis not present

## 2014-05-21 DIAGNOSIS — E1136 Type 2 diabetes mellitus with diabetic cataract: Secondary | ICD-10-CM | POA: Diagnosis not present

## 2014-05-21 LAB — GLUCOSE, CAPILLARY: GLUCOSE-CAPILLARY: 108 mg/dL — AB (ref 70–99)

## 2014-05-28 ENCOUNTER — Other Ambulatory Visit: Payer: Self-pay | Admitting: *Deleted

## 2014-06-04 DIAGNOSIS — E11621 Type 2 diabetes mellitus with foot ulcer: Secondary | ICD-10-CM | POA: Diagnosis not present

## 2014-06-04 LAB — GLUCOSE, CAPILLARY: Glucose-Capillary: 183 mg/dL — ABNORMAL HIGH (ref 65–99)

## 2014-06-06 ENCOUNTER — Ambulatory Visit (HOSPITAL_COMMUNITY)
Admission: RE | Admit: 2014-06-06 | Discharge: 2014-06-06 | Disposition: A | Discharge status: Home / Self Care | Payer: Medicare Other | Source: Ambulatory Visit | Attending: Vascular Surgery | Admitting: Vascular Surgery

## 2014-06-06 ENCOUNTER — Encounter (HOSPITAL_COMMUNITY)
Admission: RE | Disposition: A | Discharge status: Home / Self Care | Payer: Self-pay | Source: Ambulatory Visit | Attending: Vascular Surgery

## 2014-06-06 ENCOUNTER — Encounter (HOSPITAL_COMMUNITY): Payer: Self-pay | Admitting: Vascular Surgery

## 2014-06-06 DIAGNOSIS — B1920 Unspecified viral hepatitis C without hepatic coma: Secondary | ICD-10-CM | POA: Insufficient documentation

## 2014-06-06 DIAGNOSIS — Z992 Dependence on renal dialysis: Secondary | ICD-10-CM | POA: Diagnosis not present

## 2014-06-06 DIAGNOSIS — E119 Type 2 diabetes mellitus without complications: Secondary | ICD-10-CM | POA: Insufficient documentation

## 2014-06-06 DIAGNOSIS — I120 Hypertensive chronic kidney disease with stage 5 chronic kidney disease or end stage renal disease: Secondary | ICD-10-CM | POA: Diagnosis not present

## 2014-06-06 DIAGNOSIS — K7460 Unspecified cirrhosis of liver: Secondary | ICD-10-CM | POA: Diagnosis not present

## 2014-06-06 DIAGNOSIS — Z87891 Personal history of nicotine dependence: Secondary | ICD-10-CM | POA: Insufficient documentation

## 2014-06-06 DIAGNOSIS — Z794 Long term (current) use of insulin: Secondary | ICD-10-CM | POA: Insufficient documentation

## 2014-06-06 DIAGNOSIS — Z7982 Long term (current) use of aspirin: Secondary | ICD-10-CM | POA: Diagnosis not present

## 2014-06-06 DIAGNOSIS — Z8673 Personal history of transient ischemic attack (TIA), and cerebral infarction without residual deficits: Secondary | ICD-10-CM | POA: Insufficient documentation

## 2014-06-06 DIAGNOSIS — Z7902 Long term (current) use of antithrombotics/antiplatelets: Secondary | ICD-10-CM | POA: Insufficient documentation

## 2014-06-06 DIAGNOSIS — I70263 Atherosclerosis of native arteries of extremities with gangrene, bilateral legs: Secondary | ICD-10-CM

## 2014-06-06 DIAGNOSIS — N186 End stage renal disease: Secondary | ICD-10-CM | POA: Diagnosis not present

## 2014-06-06 HISTORY — PX: LOWER EXTREMITY ANGIOGRAM: SHX5508

## 2014-06-06 HISTORY — PX: PERIPHERAL VASCULAR CATHETERIZATION: SHX172C

## 2014-06-06 LAB — POCT I-STAT, CHEM 8
BUN: 28 mg/dL — ABNORMAL HIGH (ref 6–20)
CHLORIDE: 95 mmol/L — AB (ref 101–111)
Calcium, Ion: 1.25 mmol/L (ref 1.13–1.30)
Creatinine, Ser: 4.8 mg/dL — ABNORMAL HIGH (ref 0.61–1.24)
GLUCOSE: 126 mg/dL — AB (ref 65–99)
HCT: 38 % — ABNORMAL LOW (ref 39.0–52.0)
Hemoglobin: 12.9 g/dL — ABNORMAL LOW (ref 13.0–17.0)
Potassium: 3.5 mmol/L (ref 3.5–5.1)
Sodium: 139 mmol/L (ref 135–145)
TCO2: 28 mmol/L (ref 0–100)

## 2014-06-06 LAB — GLUCOSE, CAPILLARY
GLUCOSE-CAPILLARY: 104 mg/dL — AB (ref 65–99)
Glucose-Capillary: 147 mg/dL — ABNORMAL HIGH (ref 65–99)
Glucose-Capillary: 98 mg/dL (ref 65–99)

## 2014-06-06 LAB — POCT ACTIVATED CLOTTING TIME
ACTIVATED CLOTTING TIME: 214 s
ACTIVATED CLOTTING TIME: 183 s
ACTIVATED CLOTTING TIME: 171 s
Activated Clotting Time: 300 seconds
Activated Clotting Time: 288 seconds
Activated Clotting Time: 202 seconds

## 2014-06-06 SURGERY — ABDOMINAL AORTOGRAM

## 2014-06-06 MED ORDER — LABETALOL HCL 5 MG/ML IV SOLN
INTRAVENOUS | Status: DC | PRN
  Administered 2014-06-06 (×2): 10 mg via INTRAVENOUS

## 2014-06-06 MED ORDER — DOCUSATE SODIUM 100 MG PO CAPS: 100.0000 mg | ORAL_CAPSULE | Freq: Every day | ORAL | Status: DC

## 2014-06-06 MED ORDER — IODIXANOL 320 MG/ML IV SOLN
INTRAVENOUS | Status: DC | PRN
  Administered 2014-06-06: 175 mL via INTRAVENOUS

## 2014-06-06 MED ORDER — HEPARIN (PORCINE) IN NACL 2-0.9 UNIT/ML-% IJ SOLN
INTRAMUSCULAR | Status: AC
  Filled 2014-06-06: qty 1000

## 2014-06-06 MED ORDER — HYDRALAZINE HCL 20 MG/ML IJ SOLN: 5.0000 mg | INTRAMUSCULAR | Status: DC | PRN

## 2014-06-06 MED ORDER — HYDRALAZINE HCL 20 MG/ML IJ SOLN
INTRAMUSCULAR | Status: DC | PRN
  Administered 2014-06-06: 10 mg via INTRAVENOUS

## 2014-06-06 MED ORDER — ONDANSETRON HCL 4 MG/2ML IJ SOLN: 4.0000 mg | Freq: Four times a day (QID) | INTRAMUSCULAR | Status: DC | PRN

## 2014-06-06 MED ORDER — PHENOL 1.4 % MT LIQD
1.0000 | OROMUCOSAL | Status: DC | PRN
  Filled 2014-06-06: qty 177

## 2014-06-06 MED ORDER — ACETAMINOPHEN 325 MG RE SUPP
325.0000 mg | RECTAL | Status: DC | PRN
  Filled 2014-06-06: qty 2

## 2014-06-06 MED ORDER — LIDOCAINE HCL (PF) 1 % IJ SOLN
INTRAMUSCULAR | Status: DC | PRN
  Administered 2014-06-06: 30 mL

## 2014-06-06 MED ORDER — LIDOCAINE HCL (PF) 1 % IJ SOLN
INTRAMUSCULAR | Status: AC
  Filled 2014-06-06: qty 30

## 2014-06-06 MED ORDER — METOPROLOL TARTRATE 1 MG/ML IV SOLN
2.0000 mg | INTRAVENOUS | Status: DC | PRN
Start: 2014-06-06 — End: 2014-06-06

## 2014-06-06 MED ORDER — HEPARIN SODIUM (PORCINE) 1000 UNIT/ML IJ SOLN
INTRAMUSCULAR | Status: DC | PRN
  Administered 2014-06-06: 7000 [IU] via INTRAVENOUS

## 2014-06-06 MED ORDER — SODIUM CHLORIDE 0.9 % IV SOLN: 250.0000 mL | INTRAVENOUS | Status: DC | PRN

## 2014-06-06 MED ORDER — MORPHINE SULFATE 2 MG/ML IJ SOLN: 2.0000 mg | INTRAMUSCULAR | Status: DC | PRN

## 2014-06-06 MED ORDER — LABETALOL HCL 5 MG/ML IV SOLN: 10.0000 mg | INTRAVENOUS | Status: DC | PRN

## 2014-06-06 MED ORDER — HYDRALAZINE HCL 20 MG/ML IJ SOLN
INTRAMUSCULAR | Status: AC
  Filled 2014-06-06: qty 1

## 2014-06-06 MED ORDER — SODIUM CHLORIDE 0.9 % IJ SOLN: 3.0000 mL | INTRAMUSCULAR | Status: DC | PRN

## 2014-06-06 MED ORDER — LABETALOL HCL 5 MG/ML IV SOLN
INTRAVENOUS | Status: AC
  Filled 2014-06-06: qty 4

## 2014-06-06 MED ORDER — HEPARIN SODIUM (PORCINE) 1000 UNIT/ML IJ SOLN
INTRAMUSCULAR | Status: AC
  Filled 2014-06-06: qty 1

## 2014-06-06 MED ORDER — SODIUM CHLORIDE 0.9 % IJ SOLN: 3.0000 mL | Freq: Two times a day (BID) | INTRAMUSCULAR | Status: DC

## 2014-06-06 MED ORDER — GUAIFENESIN-DM 100-10 MG/5ML PO SYRP
15.0000 mL | ORAL_SOLUTION | ORAL | Status: DC | PRN
  Filled 2014-06-06: qty 15

## 2014-06-06 MED ORDER — ACETAMINOPHEN 325 MG PO TABS
325.0000 mg | ORAL_TABLET | ORAL | Status: DC | PRN
Start: 2014-06-06 — End: 2014-06-06

## 2014-06-06 SURGICAL SUPPLY — 25 items
BALLN LUTONIX 6X120X130 (BALLOONS) ×8
BALLN LUTONIX 6X150X130 (BALLOONS) ×4
BALLOON LUTONIX 6X120X130 (BALLOONS) ×6 IMPLANT
BALLOON LUTONIX 6X150X130 (BALLOONS) ×3 IMPLANT
BALLOON POWERFLX PRO 6X220X135 (BALLOONS) ×4 IMPLANT
CATH ANGIO 5F PIGTAIL 65CM (CATHETERS) ×4 IMPLANT
CATH CROSS OVER TEMPO 5F (CATHETERS) ×4 IMPLANT
COVER PRB 48X5XTLSCP FOLD TPE (BAG) ×3 IMPLANT
COVER PROBE 5X48 (BAG) ×1
DEVICE CONTINUOUS FLUSH (MISCELLANEOUS) ×4 IMPLANT
HAND CONTROLLER AVANTA (MISCELLANEOUS) IMPLANT
KIT ENCORE 26 ADVANTAGE (KITS) ×4 IMPLANT
KIT PV (KITS) ×4 IMPLANT
SET AVANTA MULTI PATIENT (MISCELLANEOUS) IMPLANT
SET AVANTA SINGLE PATIENT (MISCELLANEOUS) IMPLANT
SHEATH AVANTA HAND CONTROLLER (MISCELLANEOUS) IMPLANT
SHEATH PINNACLE 5F 10CM (SHEATH) ×4 IMPLANT
SHEATH PINNACLE ST 7F 45CM (SHEATH) ×4 IMPLANT
SYR MEDRAD MARK V 150ML (SYRINGE) ×4 IMPLANT
TAPE RADIOPAQUE TURBO (MISCELLANEOUS) ×4 IMPLANT
TRANSDUCER W/STOPCOCK (MISCELLANEOUS) ×4 IMPLANT
TRAY PV CATH (CUSTOM PROCEDURE TRAY) ×4 IMPLANT
TUBING CIL FLEX 10 FLL-RA (TUBING) ×4 IMPLANT
WIRE HITORQ VERSACORE ST 145CM (WIRE) ×4 IMPLANT
WIRE VERSACORE LOC 115CM (WIRE) ×4 IMPLANT

## 2014-06-06 NOTE — H&P (View-Only) (Signed)
[facility] [place] HISTORY AND PHYSICAL   History of Present Illness:  Patient is a 68 y.o. year old male who presents for evaluation of bilateral nonhealing toe wounds.  The patient developed ulcerations on toes on both feet after a recent trip to [place] thought to be primarily due to poor fitting shoes. The wounds have been slowly progressively worse. He has not had prior episodes.  The wounds have now been present for approximately one month. He denies any fever or chills. He overall is not very active. He spends most of his day in a wheelchair. He is overall fairly deconditioned. He apparently is able to stand and transfer with some assistance.  Other medical problems include diabetes, end-stage renal disease (Tuesday Thursday Saturday dialysis), cirrhosis from prior alcohol abuse and hepatitis C, hypertension all are currently stable. He has had a prior stroke with left-sided deficits but his son states that currently he is basically just weak everywhere. The patient does not speak English very well and some interpretation was used from the son today. The patient does seem to understand and comprehend but is not able to speak and was very well. He is on aspirin and Plavix.  Past Medical History  Diagnosis Date  . Diabetes mellitus   . Renal disorder     HD patient  . Cirrhosis   . Alcohol abuse   . Stroke     left sided deficits  . Hypertension     Past Surgical History  Procedure Laterality Date  . Eye surgery    . Av fistula placement  2006    L upper arm    Social History History  Substance Use Topics  . Smoking status: Former Smoker    Quit date: 01/10/2006  . Smokeless tobacco: Never Used  . Alcohol Use: No     Comment: Former    Family History Family History  Problem Relation Age of Onset  . Diabetes Mother   . Hypertension Mother   . Diabetes Father   . Hypertension Father     Allergies  No Known Allergies   Current Outpatient  Prescriptions  Medication Sig Dispense Refill  . aspirin 325 MG EC tablet Take 325 mg by mouth daily.    . atorvastatin (LIPITOR) 40 MG tablet Take 40 mg by mouth at bedtime.    . calcium acetate (PHOSLO) 667 MG capsule Take 2,001 mg by mouth See admin instructions. 3 capsules three times daily with meals and one capsule with snacks twice daily  0  . carvedilol (COREG CR) 10 MG 24 hr capsule Take 10 mg by mouth 3 (three) times daily. Takes on days he does not have dialysis Monday Wednesday Friday Sunday    . clindamycin (CLEOCIN) 150 MG capsule Take 3 capsules (450 mg total) by mouth 3 (three) times daily. (Patient not taking: Reported on 05/14/2014) 90 capsule 0  . clopidogrel (PLAVIX) 75 MG tablet Take 75 mg by mouth daily.    . docusate sodium (COLACE) 100 MG capsule Take 1 capsule (100 mg total) by mouth 2 (two) times daily. 10 capsule 0  . insulin NPH-insulin regular (HUMULIN 70/30) (70-30) 100 UNIT/ML injection Inject 10 Units into the skin daily.     . lactulose (CHRONULAC) 10 GM/15ML solution Take 30 mLs (20 g total) by mouth 3 (three) times daily. (Patient not taking: Reported on 04/24/2014) 960 mL 3   No current facility-administered medications for this visit.    ROS:   General:    No weight loss, Fever, chills  HEENT: No recent headaches, no nasal bleeding, no visual changes, no sore throat  Neurologic: No dizziness, blackouts, seizures. No recent symptoms of stroke or mini- stroke. No recent episodes of slurred speech, or temporary blindness.  Cardiac: No recent episodes of chest pain/pressure, no shortness of breath at rest.  + shortness of breath with exertion.  Denies history of atrial fibrillation or irregular heartbeat  Vascular: No history of rest pain in feet.  No history of claudication.  No history of non-healing ulcer, No history of DVT   Pulmonary: No home oxygen, no productive cough, no hemoptysis,  No asthma or wheezing  Musculoskeletal:  [ ] Arthritis, [ ] Low back  pain,  [ ] Joint pain  Hematologic:No history of hypercoagulable state.  No history of easy bleeding.  No history of anemia  Gastrointestinal: No hematochezia or melena,  No gastroesophageal reflux, no trouble swallowing  Urinary: [x ] chronic Kidney disease, [x ] on HD - [ ] MWF or [x ] TTHS, [ ] Burning with urination, [ ] Frequent urination, [ ] Difficulty urinating;   Skin: No rashes  Psychological: No history of anxiety,  No history of depression   Physical Examination  Filed Vitals:   05/14/14 1027 05/14/14 1029  BP: 185/75 191/81  Pulse: 61 60  Resp: 18   Height: 5' 9" (1.753 m)   Weight: 160 lb (72.576 kg)     Body mass index is 23.62 kg/(m^2).  General:  Alert and oriented, no acute distress, patient sitting in a wheelchair overall appears fairly weak and frail HEENT: Normal Neck: No bruit or JVD Pulmonary: Clear to auscultation bilaterally Cardiac: Regular Rate and Rhythm diffuse systolic murmur heard throughout the chest possibly related to his left arm AV fistula Abdomen: Soft, non-tender, non-distended, no mass Skin: No rash, early dry gangrenous changes of left fifth toe and right third and fourth toes distally no erythema no significant drainage no fluctuance Extremity Pulses:  2+ radial, brachial, femoral, absent popliteal dorsalis pedis, posterior tibial pulses bilaterally Musculoskeletal: No deformity or edema  Neurologic: Upper and lower extremity motor 5/5 and symmetric  DATA:  Patient had an arterial duplex scan performed on April 27. This showed biphasic waveforms in the common femoral bilaterally. There were monophasic waveforms below the SFA on the left area vessels were noncompressible. Multiple segments of greater than 50% stenosis diffusely through both lower extremities.   ASSESSMENT:  Dry gangrenous changes toes bilateral feet. Patient is at risk of limb loss. He has evidence of arterial occlusive disease on physical exam and objective noninvasive  exam.   PLAN:  Aortogram with bilateral lower extremity runoff possible intervention on Friday, 05/26/2014. Risks benefits possible complications and procedure details were explained to the patient and his son today. These include but are not limited to bleeding infection contrast reaction vessel injury. They understand and agree to proceed. If the patient does not have a percutaneous revascularization option. He probably is not going to be a very good candidate for bypass operation. I told the son this today. We will have further discussions regarding treatment options if he is not a percutaneous candidate.  Charles Fields, MD [facility] [place] Office: [phone] Pager: [phone]   

## 2014-06-06 NOTE — Progress Notes (Signed)
Assessment done with interpreter Barth Kirks from language resources

## 2014-06-06 NOTE — Progress Notes (Signed)
Site area: rt groin Site Prior to Removal:  Level 0 Pressure Applied For: 25 minutes Manual:   yes Patient Status During Pull:  stable Post Pull Site:  Level  0 Post Pull Instructions Given:  Yes by translator Post Pull Pulses Present: yes Dressing Applied:  tegaderm   Bedrest begins @  1420 Comments:  none

## 2014-06-06 NOTE — Interval H&P Note (Signed)
History and Physical Interval Note:  06/06/2014 8:10 AM  Greg [name]  has presented today for surgery, with the diagnosis of pvd w/ ulcer  The various methods of treatment have been discussed with the patient and family. After consideration of risks, benefits and other options for treatment, the patient has consented to  Procedure(s): Abdominal Aortogram (N/A) as a surgical intervention .  The patient's history has been reviewed, patient examined, no change in status, stable for surgery.  I have reviewed the patient's chart and labs.  Questions were answered to the patient's satisfaction.     Fabienne Bruns

## 2014-06-06 NOTE — Discharge Instructions (Signed)

## 2014-06-06 NOTE — Progress Notes (Signed)
Translator in recovery area w/patient. Instructions given to translator who interpreted for patient.

## 2014-06-10 MED FILL — Heparin Sodium (Porcine) 2 Unit/ML in Sodium Chloride 0.9%: INTRAMUSCULAR | Qty: 1000 | Status: AC

## 2014-06-11 ENCOUNTER — Encounter (HOSPITAL_BASED_OUTPATIENT_CLINIC_OR_DEPARTMENT_OTHER): Payer: Medicare Other | Attending: Surgery

## 2014-06-11 ENCOUNTER — Telehealth: Payer: Self-pay | Admitting: Vascular Surgery

## 2014-06-11 DIAGNOSIS — E11621 Type 2 diabetes mellitus with foot ulcer: Secondary | ICD-10-CM | POA: Insufficient documentation

## 2014-06-11 DIAGNOSIS — E1140 Type 2 diabetes mellitus with diabetic neuropathy, unspecified: Secondary | ICD-10-CM | POA: Insufficient documentation

## 2014-06-11 DIAGNOSIS — Z992 Dependence on renal dialysis: Secondary | ICD-10-CM | POA: Insufficient documentation

## 2014-06-11 DIAGNOSIS — I739 Peripheral vascular disease, unspecified: Secondary | ICD-10-CM | POA: Insufficient documentation

## 2014-06-11 DIAGNOSIS — N186 End stage renal disease: Secondary | ICD-10-CM | POA: Insufficient documentation

## 2014-06-11 DIAGNOSIS — L97514 Non-pressure chronic ulcer of other part of right foot with necrosis of bone: Secondary | ICD-10-CM | POA: Insufficient documentation

## 2014-06-11 DIAGNOSIS — L97521 Non-pressure chronic ulcer of other part of left foot limited to breakdown of skin: Secondary | ICD-10-CM | POA: Insufficient documentation

## 2014-06-11 DIAGNOSIS — I10 Essential (primary) hypertension: Secondary | ICD-10-CM | POA: Insufficient documentation

## 2014-06-11 NOTE — Telephone Encounter (Addendum)
-----   Message from Sharee Pimple, RN sent at 06/06/2014 10:46 AM EDT ----- Regarding: Schedule   ----- Message -----    From: Sherren Kerns, MD    Sent: 06/06/2014  10:30 AM      To: [facility] Charge Pool  Aortogram with bilat runoff 3 rd order cath left SFA PTA left SFA x 3 drug eluting ballon.  Please schedule pt to see me on June 9 in Follow up  Fabienne Bruns  06/06/14: lm for pt, mailed letter, requested interpreter, dpm

## 2014-06-12 LAB — WOUND AEROBIC CULTURE

## 2014-06-16 ENCOUNTER — Encounter: Payer: Self-pay | Admitting: Vascular Surgery

## 2014-06-18 DIAGNOSIS — N186 End stage renal disease: Secondary | ICD-10-CM | POA: Diagnosis not present

## 2014-06-18 DIAGNOSIS — I739 Peripheral vascular disease, unspecified: Secondary | ICD-10-CM | POA: Diagnosis not present

## 2014-06-18 DIAGNOSIS — E1140 Type 2 diabetes mellitus with diabetic neuropathy, unspecified: Secondary | ICD-10-CM | POA: Diagnosis not present

## 2014-06-18 DIAGNOSIS — L97514 Non-pressure chronic ulcer of other part of right foot with necrosis of bone: Secondary | ICD-10-CM | POA: Diagnosis not present

## 2014-06-18 DIAGNOSIS — E11621 Type 2 diabetes mellitus with foot ulcer: Secondary | ICD-10-CM | POA: Diagnosis not present

## 2014-06-18 DIAGNOSIS — L97521 Non-pressure chronic ulcer of other part of left foot limited to breakdown of skin: Secondary | ICD-10-CM | POA: Diagnosis not present

## 2014-06-18 DIAGNOSIS — I10 Essential (primary) hypertension: Secondary | ICD-10-CM | POA: Diagnosis not present

## 2014-06-18 DIAGNOSIS — Z992 Dependence on renal dialysis: Secondary | ICD-10-CM | POA: Diagnosis not present

## 2014-06-19 ENCOUNTER — Encounter: Payer: Medicare Other | Admitting: Vascular Surgery

## 2014-06-20 ENCOUNTER — Telehealth: Payer: Self-pay | Admitting: Vascular Surgery

## 2014-06-20 NOTE — Telephone Encounter (Addendum)
Sched appt 07/24/14 @ 8:30  ----- Message from Sherren Kerns, MD sent at 06/20/2014 12:43 PM EDT ----- Regarding: RE: post-op  5 weeks is ok.  He can come sooner if wounds are getting worse  Leonette Most ----- Message -----    From: Jena Gauss    Sent: 06/19/2014   9:05 AM      To: Sherren Kerns, MD Subject: post-op                                        This pt did not show up for his post op. The next available you have is in 5 weeks. Can this pt see another MD?  Thank you, Elon Jester

## 2014-06-23 ENCOUNTER — Ambulatory Visit (INDEPENDENT_AMBULATORY_CARE_PROVIDER_SITE_OTHER): Payer: Medicare Other | Admitting: Internal Medicine

## 2014-06-23 ENCOUNTER — Encounter: Payer: Self-pay | Admitting: Internal Medicine

## 2014-06-23 DIAGNOSIS — M869 Osteomyelitis, unspecified: Secondary | ICD-10-CM

## 2014-06-23 NOTE — Progress Notes (Signed)
Subjective:    Patient ID: Greg [name], male    DOB: 1945-01-24, 69 y.o.   MRN: [id number]  HPI Comments: [name] is a 69 year old male with a PMH as below referred to ID for osteomyelitis of his right third toe.  History is provided by the patient and his two sons.  The sons say his wounds were acquired during a month-long trip to [place].  They cannot pinpoint a specific injury that caused the wounds but their father has diabetes with peripheral neuropathy and likely developed blisters that they feel were not treated while he was away.  When he returned they took him to wound care and he was prescribed a 10 day course of clindamycin in early April.  He attends wound care clinic every Wednesday.  05/01/2014 culture of the wound on the right third toe was MSSA positive.  The HD center reports that he received one dose of Vancomycin on 05/15/2014.  He then began receiving vancomycin again last week on 06/07 with plans to continue it for five doses.  The patient had discharge from the wounds initially but the son says there has not been discharge in the past 3 weeks.  The patient denies systemic symptoms and does not have pain in his feet.     Past Medical History  Diagnosis Date  . Diabetes mellitus   . Renal disorder     HD patient  . Cirrhosis   . Alcohol abuse   . Stroke     left sided deficits  . Hypertension    Current Outpatient Prescriptions on File Prior to Visit  Medication Sig Dispense Refill  . aspirin 325 MG EC tablet Take 325 mg by mouth daily.    Marland Kitchen atorvastatin (LIPITOR) 40 MG tablet Take 40 mg by mouth at bedtime.    . calcium acetate (PHOSLO) 667 MG capsule Take 1,334 mg by mouth 2 (two) times daily with a meal.   0  . carvedilol (COREG CR) 10 MG 24 hr capsule Take 10 mg by mouth daily. Takes on days he does not have dialysis Monday Wednesday Friday Sunday    . clindamycin (CLEOCIN) 150 MG capsule Take 3 capsules (450 mg total) by mouth 3 (three) times daily. (Patient not  taking: Reported on 05/14/2014) 90 capsule 0  . clopidogrel (PLAVIX) 75 MG tablet Take 75 mg by mouth daily.    Marland Kitchen docusate sodium (COLACE) 100 MG capsule Take 1 capsule (100 mg total) by mouth 2 (two) times daily. 10 capsule 0  . insulin NPH-insulin regular (HUMULIN 70/30) (70-30) 100 UNIT/ML injection Inject into the skin daily.     Marland Kitchen lactulose (CHRONULAC) 10 GM/15ML solution Take 30 mLs (20 g total) by mouth 3 (three) times daily. (Patient not taking: Reported on 04/24/2014) 960 mL 3   No current facility-administered medications on file prior to visit.    Review of Systems  Constitutional: Negative for fever, chills, appetite change and unexpected weight change.  Respiratory: Negative for shortness of breath.   Cardiovascular: Negative for chest pain and palpitations.  Gastrointestinal: Negative for nausea, vomiting and diarrhea.       Filed Vitals:   06/23/14 1357  BP: 127/74  Pulse: 68   Objective:   Physical Exam  Constitutional: He is oriented to person, place, and time. He appears well-developed. No distress.  HENT:  Head: Normocephalic and atraumatic.  Mouth/Throat: Oropharynx is clear and moist. No oropharyngeal exudate.  Eyes: Pupils are equal, round, and reactive to  light.  Neck: Neck supple.  Cardiovascular: Normal rate, regular rhythm and normal heart sounds.  Exam reveals no gallop and no friction rub.   No murmur heard. 1+ DP pulses B/L  Pulmonary/Chest: Effort normal and breath sounds normal. No respiratory distress. He has no wheezes. He has no rales.  Abdominal: Soft. Bowel sounds are normal. He exhibits no distension and no mass. There is no tenderness. There is no rebound and no guarding.  Musculoskeletal: Normal range of motion. He exhibits no edema or tenderness.  2x2cm necrotic wound of the left fifth toe.  0.5x0.5cm superficial wound right fourth toe, medial aspect - no significant erythema, no drainage. 1x0.75cm wound of the right third toe - minimal  erythema, no drainage.  There is no tenderness to palpation.   Neurological: He is alert and oriented to person, place, and time.  Skin: Skin is warm. He is not diaphoretic.  Psychiatric: He has a normal mood and affect. His behavior is normal. Judgment and thought content normal.  Vitals reviewed.         Assessment & Plan:  Osteomyelitis of the right third toe:  Xray c/w osteomyelitis and culture positive for MSSA.  He has no systemic symptoms.  Since this is MSSA and not MRSA, will discontinue vancomycin and start cefazolin 2g at the end of each HD session (T-Th-Sat) x 6 weeks.  Orders were given verbally and faxed to HD center.  He will continue management per wound care.  We will have the patient return for follow-up in 6 weeks.  Dry gangrene of left fifth toe:  No evidence of infection on exam or imaging.  He is s/p angioplasty of left superficial femoral artery on 05/27 but missed his post-op follow-up with Dr. Darrick Penna.  Follow-up has been rescheduled for 07/13.  Further management per Dr. Darrick Penna.  Right fourth toe wound:  is located medially and likely due to friction against neighboring toe.  His sons say it has improved.  No signs of infection.  It appears to be healing well.   Addendum: I have seen and examined Mr. Greg [name] with Dr. Evelena Peat who composed this note. Mr. Greg [name] has diabetes and peripheral vascular disease and recently developed probable traumatic blisters on his toes while traveling to [place]. A plain x-ray 2 months ago showed evidence of osteomyelitis in his right third toe. He underwent a biopsy on 05/01/2014 which confirmed osteomyelitis. Bone culture grew MSSA. His wound is better but I believe he needs further therapy. We discussed options with him and his 2 sons who are with him today. He is currently on vancomycin after hemodialysis and we will change this to cefazolin after hemodialysis for 6 weeks. He will follow-up with me in 6 weeks.

## 2014-06-27 NOTE — Progress Notes (Signed)
NAHUM, SHERRER ([id number]) Visit Report for 05/01/2014 Allergy List Details Patient Name: Greg [name], Greg [name] Date of Service: 05/01/2014 9:00 AM Medical Record Number: [id number] Patient Account Number: [id number] Date of Birth/Sex: 01-04-1946 (69 y.o. Male) Treating RN: Clover Mealy, RN, BSN, [place] Sink Primary Care Physician: Rinaldo Cloud Other Clinician: Referring Physician: Beryle Lathe Treating Physician/Extender: Rudene Re in Treatment: 0 Allergies Active Allergies no allergies Allergy Notes Electronic Signature(s) Signed: 05/01/2014 4:36:06 PM By: Elpidio Eric BSN, RN Entered By: Elpidio Eric on 05/01/2014 09:05:00 Greg [name] [phone]) -------------------------------------------------------------------------------- Arrival Information Details Patient Name: Greg [name] Date of Service: 05/01/2014 9:00 AM Medical Record Number: [id number] Patient Account Number: [id number] Date of Birth/Sex: [birth year] (69 y.o. Male) Treating RN: Clover Mealy, RN, BSN, [place] Sink Primary Care Physician: Rinaldo Cloud Other Clinician: Referring Physician: Beryle Lathe Treating Physician/Extender: Rudene Re in Treatment: 0 Visit Information Patient Arrived: Wheel Chair Arrival Time: 09:02 Accompanied By: son Transfer Assistance: Manual Patient Identification Verified: Yes Secondary Verification Process Yes Completed: Patient Requires Transmission-Based No Precautions: Patient Has Alerts: Yes Patient Alerts: ABI L 0.46 R [employer] Electronic Signature(s) Signed: 05/01/2014 4:48:03 PM By: Curtis Sites Entered By: Curtis Sites on 05/01/2014 09:47:35 Greg [name] [phone]) -------------------------------------------------------------------------------- Clinic Level of Care Assessment Details Patient Name: Greg [name] Date of Service: 05/01/2014 9:00 AM Medical Record Number: [id number] Patient Account Number: [id number] Date of Birth/Sex: April 14, 1945 (69 y.o. Male) Treating RN: Curtis Sites Primary  Care Physician: Rinaldo Cloud Other Clinician: Referring Physician: Beryle Lathe Treating Physician/Extender: Rudene Re in Treatment: 0 Clinic Level of Care Assessment Items TOOL 1 Quantity Score []  - Use when EandM and Procedure is performed on INITIAL visit 0 ASSESSMENTS - Nursing Assessment / Reassessment X - General Physical Exam (combine w/ comprehensive assessment (listed just 1 20 below) when performed on new pt. evals) X - Comprehensive Assessment (HX, ROS, Risk Assessments, Wounds Hx, etc.) 1 25 ASSESSMENTS - Wound and Skin Assessment / Reassessment []  - Dermatologic / Skin Assessment (not related to wound area) 0 ASSESSMENTS - Ostomy and/or Continence Assessment and Care []  - Incontinence Assessment and Management 0 []  - Ostomy Care Assessment and Management (repouching, etc.) 0 PROCESS - Coordination of Care X - Simple Patient / Family Education for ongoing care 1 15 []  - Complex (extensive) Patient / Family Education for ongoing care 0 X - Staff obtains Chiropractor, Records, Test Results / Process Orders 1 10 []  - Staff telephones HHA, Nursing Homes / Clarify orders / etc 0 []  - Routine Transfer to another Facility (non-emergent condition) 0 []  - Routine Hospital Admission (non-emergent condition) 0 X - New Admissions / Manufacturing engineer / Ordering NPWT, Apligraf, etc. 1 15 []  - Emergency Hospital Admission (emergent condition) 0 PROCESS - Special Needs []  - Pediatric / Minor Patient Management 0 []  - Isolation Patient Management 0 Greg [name], Greg [name] ([id number]) []  - Hearing / Language / Visual special needs 0 []  - Assessment of Community assistance (transportation, D/C planning, etc.) 0 []  - Additional assistance / Altered mentation 0 []  - Support Surface(s) Assessment (bed, cushion, seat, etc.) 0 INTERVENTIONS - Miscellaneous []  - External ear exam 0 []  - Patient Transfer (multiple staff / Nurse, adult / Similar devices) 0 []  - Simple Staple / Suture  removal (25 or less) 0 []  - Complex Staple / Suture removal (26 or more) 0 []  - Hypo/Hyperglycemic Management (do not check if billed separately) 0 X - Ankle / Brachial Index (ABI) - do not check if billed separately 1 15 Has the patient been seen at the hospital within  the last three years: Yes Total Score: 100 Level Of Care: New/Established - Level 3 Electronic Signature(s) Signed: 05/01/2014 4:48:03 PM By: Curtis Sites Entered By: Curtis Sites on 05/01/2014 10:08:14 Greg [name] [phone]) -------------------------------------------------------------------------------- Encounter Discharge Information Details Patient Name: Greg [name] Date of Service: 05/01/2014 9:00 AM Medical Record Number: [id number] Patient Account Number: [id number] Date of Birth/Sex: 11/25/1945 (69 y.o. Male) Treating RN: Clover Mealy, RN, BSN, [place] Sink Primary Care Physician: Rinaldo Cloud Other Clinician: Referring Physician: Beryle Lathe Treating Physician/Extender: Rudene Re in Treatment: 0 Encounter Discharge Information Items Discharge Pain Level: 0 Discharge Condition: Stable Ambulatory Status: Wheelchair Discharge Destination: Home Transportation: Private Auto Accompanied By: son Schedule Follow-up Appointment: No Medication Reconciliation completed and provided to Patient/Care No Greg [name]: Provided on Clinical Summary of Care: 05/01/2014 Form Type Recipient Paper Patient KK Electronic Signature(s) Signed: 05/01/2014 4:36:06 PM By: Elpidio Eric BSN, RN Previous Signature: 05/01/2014 10:20:33 AM Version By: Francie Massing Entered By: Elpidio Eric on 05/01/2014 11:50:40 Greg [name] [phone]) -------------------------------------------------------------------------------- Lower Extremity Assessment Details Patient Name: Greg [name] Date of Service: 05/01/2014 9:00 AM Medical Record Number: [id number] Patient Account Number: [id number] Date of Birth/Sex: [birth year] (69 y.o. Male) Treating RN:  Clover Mealy, RN, BSN, [place] Sink Primary Care Physician: Rinaldo Cloud Other Clinician: Referring Physician: Beryle Lathe Treating Physician/Extender: Rudene Re in Treatment: 0 Vascular Assessment Pulses: Posterior Tibial Palpable: [Left:No] [Right:No] Doppler: [Left:Monophasic] [Right:Monophasic] Dorsalis Pedis Palpable: [Left:No] [Right:No] Doppler: [Left:Monophasic] [Right:Monophasic] Extremity colors, hair growth, and conditions: Extremity Color: [Left:Hyperpigmented] [Right:Hyperpigmented] Hair Growth on Extremity: [Left:No] [Right:No] Temperature of Extremity: [Left:Warm] [Right:Warm] Capillary Refill: [Left:< 3 seconds] [Right:< 3 seconds] Dependent Rubor: [Left:No] [Right:No] Blanched when Elevated: [Left:No] [Right:No] Lipodermatosclerosis: [Left:No] [Right:No] Blood Pressure: Brachial: [Left:194] Dorsalis Pedis: 90 [Left:Dorsalis Pedis:] Ankle: Posterior Tibial: [Left:Posterior Tibial: 0.46] Toe Nail Assessment Left: Right: Thick: No No Discolored: No No Deformed: No No Improper Length and Hygiene: Yes Yes Notes noncompressible to right LE Electronic Signature(s) Signed: 05/01/2014 4:36:06 PM By: Elpidio Eric BSN, RN Entered By: Elpidio Eric on 05/01/2014 09:36:33 Greg [name] [phone]) -------------------------------------------------------------------------------- Multi Wound Chart Details Patient Name: Greg [name] Date of Service: 05/01/2014 9:00 AM Medical Record Number: [id number] Patient Account Number: [id number] Date of Birth/Sex: 1945-08-22 (69 y.o. 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] [2:the Lower Extremity] Comorbid History: [1:Hypertension, Myocardial Hypertension, Myocardial N/A Infarction, Type II Diabetes, End Stage Renal Disease, Neuropathy] [2:Infarction, Type II Diabetes, End Stage Renal Disease, Neuropathy] Date Acquired: [1:03/31/2014] [2:03/31/2014] [N/A:N/A] Weeks of Treatment: [1:0] [2:0] [N/A:N/A] Wound Status: [1:Open] [2:Open] [N/A:N/A] Measurements L x W x D 2.6x3.5x0.1 [2:2.7x2x0.1] [N/A:N/A] (cm) Area (cm) : [1:7.147] [2:4.241] [N/A:N/A] Volume (cm) : [1:0.715] [2:0.424] [N/A:N/A] % Reduction in Area: [1:0.00%] [2:0.00%] [N/A:N/A] % Reduction in Volume: 0.00% [2:0.00%] [N/A:N/A] Classification: [1:Unable to visualize wound Unable to visualize wound N/A bed] [2:bed] Exudate Amount: [1:Small] [2:Small] [N/A:N/A] Exudate Type: [1:Serosanguineous] [2:Serous] [N/A:N/A] Exudate Color: [1:red, brown] [2:amber] [N/A:N/A] Wound Margin: [1:Indistinct, nonvisible] [2:Indistinct, nonvisible] [N/A:N/A] Granulation Amount: [1:Small (1-33%)] [2:None Present (0%)] [N/A:N/A] Necrotic Amount: [1:Large (67-100%)] [2:Large (67-100%)] [N/A:N/A] Necrotic Tissue: [1:Eschar] [2:Eschar, Adherent Slough N/A] Exposed Structures: [1:Fascia: No Fat: No] [2:Fascia: No Fat: No] [  N/A:N/A] Tendon: No Tendon: No Muscle: No Muscle: No Joint: No Joint: No Bone: No Bone: No Limited to Skin Limited to Skin Breakdown Breakdown Epithelialization: None None N/A Periwound Skin Texture: Edema: No Edema: No N/A Excoriation: No Excoriation: No Induration: No Induration: No Callus: No Callus: No Crepitus: No Crepitus: No Fluctuance: No Fluctuance: No Friable: No Friable: No Rash: No Rash: No Scarring: No Scarring: No Periwound Skin  Moist: Yes Moist: Yes N/A Moisture: Dry/Scaly: Yes Dry/Scaly: Yes Maceration: No Maceration: No Periwound Skin Color: Atrophie Blanche: No Atrophie Blanche: No N/A Cyanosis: No Cyanosis: No Ecchymosis: No Ecchymosis: No Erythema: No Erythema: No Hemosiderin Staining: No Hemosiderin Staining: No Mottled: No Mottled: No Pallor: No Pallor: No Rubor: No Rubor: No Temperature: No Abnormality No Abnormality N/A Tenderness on No No N/A Palpation: Wound Preparation: Ulcer Cleansing: Ulcer Cleansing: N/A Rinsed/Irrigated with Rinsed/Irrigated with Saline Saline Topical Anesthetic Topical Anesthetic Applied: Other: lidocaine Applied: Other: lidocaine 4% 4% Treatment Notes Electronic Signature(s) Signed: 05/01/2014 4:48:03 PM By: Curtis Sites Entered By: Curtis Sites on 05/01/2014 09:49:59 Greg [name] [phone]) -------------------------------------------------------------------------------- Multi-Disciplinary Care Plan Details Patient Name: Greg [name] Date of Service: 05/01/2014 9:00 AM Medical Record Number: [id number] Patient Account Number: [id number] Date of Birth/Sex: Jan 06, 1946 (69 y.o. Male) Treating RN: Curtis Sites Primary Care Physician: Rinaldo Cloud Other Clinician: Referring Physician: Beryle Lathe Treating Physician/Extender: Rudene Re in Treatment: 0 Active Inactive Electronic Signature(s) Signed: 05/27/2014 8:15:28 AM By: Elliot Gurney RN, BSN, Kim RN, BSN Signed: 06/25/2014 4:33:23 PM By: Curtis Sites Previous Signature: 05/01/2014 4:48:03 PM Version By: Curtis Sites Entered By: Elliot Gurney RN, BSN, Kim on 05/27/2014 11:15:28 Greg [name] [phone]) -------------------------------------------------------------------------------- Pain Assessment Details Patient Name: Greg [name] Date of Service: 05/01/2014 9:00 AM Medical Record Number: [id number] Patient Account Number: [id number] Date of Birth/Sex: August 17, 1945 (69 y.o. Male) Treating RN:  Clover Mealy, RN, BSN, [place] Primary Care Physician: Rinaldo Cloud Other Clinician: Referring Physician: Beryle Lathe Treating Physician/Extender: Rudene Re in Treatment: 0 Active Problems Location of Pain Severity and Description of Pain Patient Has Paino No Site Locations Pain Management and Medication Current Pain Management: Electronic Signature(s) Signed: 05/01/2014 4:36:06 PM By: Elpidio Eric BSN, RN Entered By: Elpidio Eric on 05/01/2014 09:04:44 Greg [name] [phone]) -------------------------------------------------------------------------------- Patient/Caregiver Education Details Patient Name: Greg [name] Date of Service: 05/01/2014 9:00 AM Medical Record Number: [id number] Patient Account Number: [id number] Date of Birth/Gender: [birth year] (69 y.o. Male) Treating RN: Curtis Sites Primary Care Physician: Rinaldo Cloud Other Clinician: Referring Physician: Beryle Lathe Treating Physician/Extender: Rudene Re in Treatment: 0 Education Assessment Education Provided To: Patient and Caregiver Education Topics Provided Wound/Skin Impairment: Handouts: Caring for Your Ulcer, Skin Care Do's and Dont's, Other: osetomyelitis Methods: Demonstration, Explain/Verbal Responses: State content correctly Electronic Signature(s) Signed: 05/01/2014 4:48:03 PM By: Curtis Sites Entered By: Curtis Sites on 05/01/2014 10:08:40 Greg [name] [phone]) -------------------------------------------------------------------------------- Wound Assessment Details Patient Name: Greg [name] Date of Service: 05/01/2014 9:00 AM Medical Record Number: [id number] Patient Account Number: [id number] Date of Birth/Sex: 13-Jan-1945 (69 y.o. Male) Treating RN: Clover Mealy, RN, BSN, [place] Primary Care Physician: Rinaldo Cloud Other Clinician: Referring Physician: Beryle Lathe Treating Physician/Extender: Rudene Re in Treatment: 0 Wound Status Wound Number: 1 Primary Diabetic  Wound/Ulcer of the Lower Etiology: Extremity Wound Location: Left Toe Fifth Wound Open Wounding Event: Gradually Appeared Status: Date Acquired: 03/31/2014 Comorbid Hypertension, Myocardial Infarction, Weeks Of Treatment: 0 History: Type II Diabetes, End Stage Renal Clustered Wound: No Disease, Neuropathy Photos Photo Uploaded By: Elpidio Eric on 05/01/2014 14:18:15 Wound Measurements Length: (cm) 2.6 Width: (  cm) 3.5 Depth: (cm) 0.1 Area: (cm) 7.147 Volume: (cm) 0.715 % Reduction in Area: 0% % Reduction in Volume: 0% Epithelialization: None Tunneling: No Undermining: No Wound Description Classification: Unable to visualize wound bed Wound Margin: Indistinct, nonvisible Exudate Amount: Small Exudate Type: Serosanguineous Exudate Color: red, brown Foul Odor After Cleansing: No Wound Bed Granulation Amount: Small (1-33%) Exposed Structure Necrotic Amount: Large (67-100%) Fascia Exposed: No Necrotic Quality: Eschar Fat Layer Exposed: No Tendon Exposed: No Flavell, Rai ([id number]) Muscle Exposed: No Joint Exposed: No Bone Exposed: No Limited to Skin Breakdown Periwound Skin Texture Texture Color No Abnormalities Noted: No No Abnormalities Noted: No Callus: No Atrophie Blanche: No Crepitus: No Cyanosis: No Excoriation: No Ecchymosis: No Fluctuance: No Erythema: No Friable: No Hemosiderin Staining: No Induration: No Mottled: No Localized Edema: No Pallor: No Rash: No Rubor: No Scarring: No Temperature / Pain Moisture Temperature: No Abnormality No Abnormalities Noted: No Dry / Scaly: Yes Maceration: No Moist: Yes Wound Preparation Ulcer Cleansing: Rinsed/Irrigated with Saline Topical Anesthetic Applied: Other: lidocaine 4%, Electronic Signature(s) Signed: 05/01/2014 4:36:06 PM By: Elpidio Eric BSN, RN Entered By: Elpidio Eric on 05/01/2014 09:22:04 Greg [name]  ([id number]) -------------------------------------------------------------------------------- Wound Assessment Details Patient Name: Greg [name] Date of Service: 05/01/2014 9:00 AM Medical Record Number: [id number] Patient Account Number: [id number] Date of Birth/Sex: 02-16-1945 (69 y.o. Male) Treating RN: Afful, RN, BSN, Rita Primary Care Physician: Rinaldo Cloud Other Clinician: Referring Physician: Beryle Lathe Treating Physician/Extender: Rudene Re in Treatment: 0 Wound Status Wound Number: 2 Primary Diabetic Wound/Ulcer of the Lower Etiology: Extremity Wound Location: Right Toe Third Wound Open Wounding Event: Gradually Appeared Status: Date Acquired: 03/31/2014 Comorbid Hypertension, Myocardial Infarction, Weeks Of Treatment: 0 History: Type II Diabetes, End Stage Renal Clustered Wound: No Disease, Neuropathy Photos Photo Uploaded By: Elpidio Eric on 05/01/2014 14:18:15 Wound Measurements Length: (cm) 2.7 Width: (cm) 2 Depth: (cm) 0.1 Area: (cm) 4.241 Volume: (cm) 0.424 % Reduction in Area: 0% % Reduction in Volume: 0% Epithelialization: None Tunneling: No Undermining: No Wound Description Classification: Unable to visualize wound bed Wound Margin: Indistinct, nonvisible Exudate Amount: Small Exudate Type: Serous Exudate Color: amber Foul Odor After Cleansing: No Wound Bed Granulation Amount: None Present (0%) Exposed Structure Necrotic Amount: Large (67-100%) Fascia Exposed: No Necrotic Quality: Eschar, Adherent Slough Fat Layer Exposed: No Tendon Exposed: No Jurado, Kevyn ([id number]) Muscle Exposed: No Joint Exposed: No Bone Exposed: No Limited to Skin Breakdown Periwound Skin Texture Texture Color No Abnormalities Noted: No No Abnormalities Noted: No Callus: No Atrophie Blanche: No Crepitus: No Cyanosis: No Excoriation: No Ecchymosis: No Fluctuance: No Erythema: No Friable: No Hemosiderin Staining: No Induration: No Mottled:  No Localized Edema: No Pallor: No Rash: No Rubor: No Scarring: No Temperature / Pain Moisture Temperature: No Abnormality No Abnormalities Noted: No Dry / Scaly: Yes Maceration: No Moist: Yes Wound Preparation Ulcer Cleansing: Rinsed/Irrigated with Saline Topical Anesthetic Applied: Other: lidocaine 4%, Electronic Signature(s) Signed: 05/01/2014 4:36:06 PM By: Elpidio Eric BSN, RN Entered By: Elpidio Eric on 05/01/2014 09:25:42 Greg [name] ([id number]) -------------------------------------------------------------------------------- Vitals Details Patient Name: Greg [name] Date of Service: 05/01/2014 9:00 AM Medical Record Number: [id number] Patient Account Number: [id number] Date of Birth/Sex: [birth year] (69 y.o. Male) Treating RN: Clover Mealy, RN, BSN, [place] Sink Primary Care Physician: Rinaldo Cloud Other Clinician: Referring Physician: Beryle Lathe Treating Physician/Extender: Rudene Re in Treatment: 0 Vital Signs Time Taken: 09:29 Temperature (F): 97.5 Height (in): 69 Pulse (bpm): 60 Source: Stated Respiratory Rate (breaths/min): 16 Blood Pressure (mmHg): 194/70 Reference Range: 80 - 120 mg /  dl Electronic Signature(s) Signed: 05/01/2014 4:36:06 PM By: Elpidio Eric BSN, RN Entered By: Elpidio Eric on 05/01/2014 13:88:71

## 2014-07-11 ENCOUNTER — Encounter (HOSPITAL_BASED_OUTPATIENT_CLINIC_OR_DEPARTMENT_OTHER): Payer: Medicare Other | Attending: Internal Medicine

## 2014-07-19 ENCOUNTER — Encounter (HOSPITAL_COMMUNITY): Payer: Self-pay | Admitting: Emergency Medicine

## 2014-07-19 ENCOUNTER — Inpatient Hospital Stay (HOSPITAL_COMMUNITY): Payer: Medicare Other

## 2014-07-19 ENCOUNTER — Inpatient Hospital Stay (HOSPITAL_COMMUNITY)
Admission: EM | Admit: 2014-07-19 | Discharge: 2014-07-19 | DRG: 314 | Discharge status: Against Medical Advice | Payer: Medicare Other | Attending: Vascular Surgery | Admitting: Vascular Surgery

## 2014-07-19 DIAGNOSIS — Z87891 Personal history of nicotine dependence: Secondary | ICD-10-CM | POA: Diagnosis not present

## 2014-07-19 DIAGNOSIS — Z794 Long term (current) use of insulin: Secondary | ICD-10-CM

## 2014-07-19 DIAGNOSIS — B1920 Unspecified viral hepatitis C without hepatic coma: Secondary | ICD-10-CM | POA: Diagnosis present

## 2014-07-19 DIAGNOSIS — Z992 Dependence on renal dialysis: Secondary | ICD-10-CM | POA: Diagnosis not present

## 2014-07-19 DIAGNOSIS — Z7902 Long term (current) use of antithrombotics/antiplatelets: Secondary | ICD-10-CM

## 2014-07-19 DIAGNOSIS — Z833 Family history of diabetes mellitus: Secondary | ICD-10-CM

## 2014-07-19 DIAGNOSIS — Y848 Other medical procedures as the cause of abnormal reaction of the patient, or of later complication, without mention of misadventure at the time of the procedure: Secondary | ICD-10-CM | POA: Diagnosis present

## 2014-07-19 DIAGNOSIS — I120 Hypertensive chronic kidney disease with stage 5 chronic kidney disease or end stage renal disease: Secondary | ICD-10-CM | POA: Diagnosis present

## 2014-07-19 DIAGNOSIS — Z8249 Family history of ischemic heart disease and other diseases of the circulatory system: Secondary | ICD-10-CM

## 2014-07-19 DIAGNOSIS — Z01810 Encounter for preprocedural cardiovascular examination: Secondary | ICD-10-CM

## 2014-07-19 DIAGNOSIS — F1010 Alcohol abuse, uncomplicated: Secondary | ICD-10-CM | POA: Diagnosis present

## 2014-07-19 DIAGNOSIS — Z79899 Other long term (current) drug therapy: Secondary | ICD-10-CM | POA: Diagnosis not present

## 2014-07-19 DIAGNOSIS — T829XXA Unspecified complication of cardiac and vascular prosthetic device, implant and graft, initial encounter: Secondary | ICD-10-CM | POA: Diagnosis present

## 2014-07-19 DIAGNOSIS — Z7982 Long term (current) use of aspirin: Secondary | ICD-10-CM | POA: Diagnosis not present

## 2014-07-19 DIAGNOSIS — E1122 Type 2 diabetes mellitus with diabetic chronic kidney disease: Secondary | ICD-10-CM | POA: Diagnosis present

## 2014-07-19 DIAGNOSIS — T82838A Hemorrhage of vascular prosthetic devices, implants and grafts, initial encounter: Principal | ICD-10-CM | POA: Diagnosis present

## 2014-07-19 DIAGNOSIS — N186 End stage renal disease: Secondary | ICD-10-CM | POA: Diagnosis present

## 2014-07-19 DIAGNOSIS — K7030 Alcoholic cirrhosis of liver without ascites: Secondary | ICD-10-CM | POA: Diagnosis present

## 2014-07-19 DIAGNOSIS — E1151 Type 2 diabetes mellitus with diabetic peripheral angiopathy without gangrene: Secondary | ICD-10-CM | POA: Diagnosis present

## 2014-07-19 DIAGNOSIS — I69354 Hemiplegia and hemiparesis following cerebral infarction affecting left non-dominant side: Secondary | ICD-10-CM

## 2014-07-19 LAB — PROTIME-INR
INR: 1.19 (ref 0.00–1.49)
Prothrombin Time: 15.3 seconds — ABNORMAL HIGH (ref 11.6–15.2)

## 2014-07-19 LAB — CBC
HCT: 39.5 % (ref 39.0–52.0)
HEMOGLOBIN: 12.9 g/dL — AB (ref 13.0–17.0)
MCH: 29 pg (ref 26.0–34.0)
MCHC: 32.7 g/dL (ref 30.0–36.0)
MCV: 88.8 fL (ref 78.0–100.0)
Platelets: 59 10*3/uL — ABNORMAL LOW (ref 150–400)
RBC: 4.45 MIL/uL (ref 4.22–5.81)
RDW: 15.3 % (ref 11.5–15.5)
WBC: 6.1 10*3/uL (ref 4.0–10.5)

## 2014-07-19 LAB — COMPREHENSIVE METABOLIC PANEL
ALBUMIN: 3 g/dL — AB (ref 3.5–5.0)
ALT: 5 U/L — ABNORMAL LOW (ref 17–63)
ANION GAP: 10 (ref 5–15)
AST: 31 U/L (ref 15–41)
Alkaline Phosphatase: 207 U/L — ABNORMAL HIGH (ref 38–126)
BUN: 5 mg/dL — ABNORMAL LOW (ref 6–20)
CO2: 33 mmol/L — ABNORMAL HIGH (ref 22–32)
CREATININE: 2.93 mg/dL — AB (ref 0.61–1.24)
Calcium: 9.5 mg/dL (ref 8.9–10.3)
Chloride: 92 mmol/L — ABNORMAL LOW (ref 101–111)
GFR calc Af Amer: 24 mL/min — ABNORMAL LOW (ref >60)
GFR calc non Af Amer: 21 mL/min — ABNORMAL LOW (ref >60)
Glucose, Bld: 110 mg/dL — ABNORMAL HIGH (ref 65–99)
Potassium: 3.2 mmol/L — ABNORMAL LOW (ref 3.5–5.1)
Sodium: 135 mmol/L (ref 135–145)
Total Bilirubin: 0.4 mg/dL (ref 0.3–1.2)
Total Protein: 8 g/dL (ref 6.5–8.1)

## 2014-07-19 LAB — GLUCOSE, CAPILLARY: Glucose-Capillary: 100 mg/dL — ABNORMAL HIGH (ref 65–99)

## 2014-07-19 MED ORDER — INSULIN ASPART 100 UNIT/ML ~~LOC~~ SOLN: 0.0000 [IU] | Freq: Three times a day (TID) | SUBCUTANEOUS | Status: DC

## 2014-07-19 MED ORDER — ASPIRIN EC 325 MG PO TBEC: 325.0000 mg | DELAYED_RELEASE_TABLET | Freq: Every day | ORAL | Status: DC

## 2014-07-19 MED ORDER — ACETAMINOPHEN 325 MG RE SUPP
325.0000 mg | RECTAL | Status: DC | PRN
  Filled 2014-07-19: qty 2

## 2014-07-19 MED ORDER — LACTULOSE 10 GM/15ML PO SOLN
20.0000 g | Freq: Three times a day (TID) | ORAL | Status: DC
  Filled 2014-07-19: qty 30

## 2014-07-19 MED ORDER — ACETAMINOPHEN 325 MG PO TABS: 325.0000 mg | ORAL_TABLET | ORAL | Status: DC | PRN

## 2014-07-19 MED ORDER — ONDANSETRON HCL 4 MG/2ML IJ SOLN: 4.0000 mg | Freq: Four times a day (QID) | INTRAMUSCULAR | Status: DC | PRN

## 2014-07-19 MED ORDER — CALCIUM ACETATE (PHOS BINDER) 667 MG PO CAPS
1334.0000 mg | ORAL_CAPSULE | Freq: Two times a day (BID) | ORAL | Status: DC
  Filled 2014-07-19: qty 2

## 2014-07-19 MED ORDER — SODIUM CHLORIDE 0.9 % IJ SOLN: 3.0000 mL | INTRAMUSCULAR | Status: DC | PRN

## 2014-07-19 MED ORDER — CARVEDILOL PHOSPHATE ER 10 MG PO CP24
10.0000 mg | ORAL_CAPSULE | Freq: Every day | ORAL | Status: DC
  Filled 2014-07-19: qty 1

## 2014-07-19 MED ORDER — LORAZEPAM 0.5 MG PO TABS
0.5000 mg | ORAL_TABLET | Freq: Once | ORAL | Status: AC
  Administered 2014-07-19: 0.5 mg via ORAL
  Filled 2014-07-19: qty 1

## 2014-07-19 MED ORDER — HYDRALAZINE HCL 20 MG/ML IJ SOLN: 5.0000 mg | INTRAMUSCULAR | Status: DC | PRN

## 2014-07-19 MED ORDER — GABAPENTIN 100 MG PO CAPS
100.0000 mg | ORAL_CAPSULE | Freq: Every evening | ORAL | Status: DC | PRN
  Filled 2014-07-19: qty 1

## 2014-07-19 MED ORDER — SODIUM CHLORIDE 0.9 % IV SOLN: 250.0000 mL | INTRAVENOUS | Status: DC | PRN

## 2014-07-19 MED ORDER — SORBITOL 70 % SOLN: 30.0000 mL | Freq: Every day | Status: DC | PRN

## 2014-07-19 MED ORDER — DOCUSATE SODIUM 100 MG PO CAPS
100.0000 mg | ORAL_CAPSULE | Freq: Two times a day (BID) | ORAL | Status: DC
  Administered 2014-07-19: 100 mg via ORAL
  Filled 2014-07-19: qty 1

## 2014-07-19 MED ORDER — ATORVASTATIN CALCIUM 40 MG PO TABS
40.0000 mg | ORAL_TABLET | Freq: Every day | ORAL | Status: DC
  Administered 2014-07-19: 40 mg via ORAL
  Filled 2014-07-19: qty 1

## 2014-07-19 MED ORDER — METOPROLOL TARTRATE 1 MG/ML IV SOLN: 2.0000 mg | INTRAVENOUS | Status: DC | PRN

## 2014-07-19 MED ORDER — PANTOPRAZOLE SODIUM 40 MG PO TBEC
40.0000 mg | DELAYED_RELEASE_TABLET | Freq: Every day | ORAL | Status: DC
  Administered 2014-07-19: 40 mg via ORAL
  Filled 2014-07-19: qty 1

## 2014-07-19 MED ORDER — SODIUM CHLORIDE 0.9 % IJ SOLN: 3.0000 mL | Freq: Two times a day (BID) | INTRAMUSCULAR | Status: DC

## 2014-07-19 MED ORDER — INSULIN ASPART PROT & ASPART (70-30 MIX) 100 UNIT/ML ~~LOC~~ SUSP
10.0000 [IU] | Freq: Every day | SUBCUTANEOUS | Status: DC
  Filled 2014-07-19: qty 10

## 2014-07-19 MED ORDER — LABETALOL HCL 5 MG/ML IV SOLN
10.0000 mg | INTRAVENOUS | Status: DC | PRN
  Filled 2014-07-19: qty 4

## 2014-07-19 MED ORDER — MORPHINE SULFATE 2 MG/ML IJ SOLN: 2.0000 mg | INTRAMUSCULAR | Status: DC | PRN

## 2014-07-19 NOTE — ED Notes (Signed)
Bandage changed and assessed by MD.  Tight pressure bandage re-applied.

## 2014-07-19 NOTE — Progress Notes (Signed)
Pt and family asking if pt can be taken home until procedure on Monday. MD pg'd, stated patient can leave AMA if family wishes, but does not recommend discharge for patient. Explained to family their rights and they have requested to take patient home AMA. IV d/c'd, tele off, CCMD notified. Pt left via wheelchair with belongings and family. Family will bring pt for procedure with Dr. Darrick Penna on Monday. Dr. Darrick Penna aware.

## 2014-07-19 NOTE — ED Notes (Signed)
Per EMS:  Pt has dialysis today.  When he got home from dialysis and was sitting in his chair pt noted blood from the site.  EMS sts pt is bleeding from multiple access sites around the fistula.  Pressure placed on site and hemorrhage controlled at this time with tight pressure.

## 2014-07-19 NOTE — ED Notes (Addendum)
Pt and family state that pt does not produce urine.

## 2014-07-19 NOTE — ED Provider Notes (Signed)
CSN: [id number]     Arrival date & time 07/19/14  1841 History   First MD Initiated Contact with Patient 07/19/14 1847     Chief Complaint  Patient presents with  . Vascular Access Problem     (Consider location/radiation/quality/duration/timing/severity/associated sxs/prior Treatment) HPI Comments: Patient here with bleeding from his left upper extremity fistula. Patient had dialysis today than when he got home had a sudden onset of squirting blood coming out of this graft. Denies any numbness or tingling to his hand. Direct pressure applied without success. EMS called and patient transported here.  The history is provided by the patient and a relative. A language interpreter was used.    Past Medical History  Diagnosis Date  . Diabetes mellitus   . Renal disorder     HD patient  . Cirrhosis   . Alcohol abuse   . Stroke     left sided deficits  . Hypertension    Past Surgical History  Procedure Laterality Date  . Eye surgery    . Av fistula placement  2006    L upper arm  . Peripheral vascular catheterization N/A 06/06/2014    Procedure: Abdominal Aortogram;  Surgeon: Sherren Kerns, MD;  Location: [facility] INVASIVE CV LAB;  Service: Cardiovascular;  Laterality: N/A;  . Lower extremity angiogram Bilateral 06/06/2014    Procedure: Lower Extremity Angiogram;  Surgeon: Sherren Kerns, MD;  Location: [facility] INVASIVE CV LAB;  Service: Cardiovascular;  Laterality: Bilateral;  . Peripheral vascular catheterization Left 06/06/2014    Procedure: Peripheral Vascular Intervention;  Surgeon: Sherren Kerns, MD;  Location: [facility] INVASIVE CV LAB;  Service: Cardiovascular;  Laterality: Left;  pta drug coated x3   Family History  Problem Relation Age of Onset  . Diabetes Mother   . Hypertension Mother   . Diabetes Father   . Hypertension Father    History  Substance Use Topics  . Smoking status: Former Smoker    Quit date: 01/10/2006  . Smokeless tobacco: Never Used  . Alcohol Use: No   Comment: Former    Review of Systems  All other systems reviewed and are negative.     Allergies  Review of patient's allergies indicates no known allergies.  Home Medications   Prior to Admission medications   Medication Sig Start Date End Date Taking? Authorizing Provider  clopidogrel (PLAVIX) 75 MG tablet Take 75 mg by mouth daily.   Yes Historical Provider, MD  aspirin 325 MG EC tablet Take 325 mg by mouth daily.    Historical Provider, MD  atorvastatin (LIPITOR) 40 MG tablet Take 40 mg by mouth at bedtime.    Historical Provider, MD  calcium acetate (PHOSLO) 667 MG capsule Take 1,334 mg by mouth 2 (two) times daily with a meal.  03/26/14   Historical Provider, MD  carvedilol (COREG CR) 10 MG 24 hr capsule Take 10 mg by mouth daily. Takes on days he does not have dialysis Monday Wednesday Friday Sunday    Historical Provider, MD  clindamycin (CLEOCIN) 150 MG capsule Take 3 capsules (450 mg total) by mouth 3 (three) times daily. Patient not taking: Reported on 05/14/2014 04/24/14   Charlestine Night, PA-C  docusate sodium (COLACE) 100 MG capsule Take 1 capsule (100 mg total) by mouth 2 (two) times daily. 07/10/13   Reuben Likes, MD  insulin NPH-insulin regular (HUMULIN 70/30) (70-30) 100 UNIT/ML injection Inject into the skin daily.     Historical Provider, MD  lactulose (CHRONULAC) 10 GM/15ML solution  Take 30 mLs (20 g total) by mouth 3 (three) times daily. Patient not taking: Reported on 04/24/2014 07/10/13   Reuben Likes, MD   BP 180/99 mmHg  Pulse 66  Temp(Src) 97.5 F (36.4 C) (Oral)  Resp 22  SpO2 100% Physical Exam  Constitutional: He is oriented to person, place, and time. He appears well-developed and well-nourished.  Non-toxic appearance. No distress.  HENT:  Head: Normocephalic and atraumatic.  Eyes: Conjunctivae, EOM and lids are normal. Pupils are equal, round, and reactive to light.  Neck: Normal range of motion. Neck supple. No tracheal deviation present. No  thyroid mass present.  Cardiovascular: Normal rate, regular rhythm and normal heart sounds.  Exam reveals no gallop.   No murmur heard. Pulmonary/Chest: Effort normal and breath sounds normal. No stridor. No respiratory distress. He has no decreased breath sounds. He has no wheezes. He has no rhonchi. He has no rales.  Abdominal: Soft. Normal appearance and bowel sounds are normal. He exhibits no distension. There is no tenderness. There is no rebound and no CVA tenderness.  Musculoskeletal: Normal range of motion. He exhibits no edema or tenderness.       Arms: Neurological: He is alert and oriented to person, place, and time. He has normal strength. No cranial nerve deficit or sensory deficit. GCS eye subscore is 4. GCS verbal subscore is 5. GCS motor subscore is 6.  Skin: Skin is warm and dry. No abrasion and no rash noted.  Psychiatric: He has a normal mood and affect. His speech is normal and behavior is normal.  Nursing note and vitals reviewed.   ED Course  Procedures (including critical care time) Labs Review Labs Reviewed - No data to display  Imaging Review No results found.   EKG Interpretation None      MDM   Final diagnoses:  None   we'll consult vascular surgery    Lorre Nick, MD 07/19/14 [phone]

## 2014-07-19 NOTE — H&P (Signed)
[facility] [place] HISTORY AND PHYSICAL   History of Present Illness:  Patient is a 69 y.o. year old male who presents for evaluation of bleeding left arm AV fistula.  Pt began bleeding at home today after dialysis from both needle sites.  Apparently significant blood loss at home and during transport.  Denies prior episodes.  Did have full dialysis treatment today.  Pt son acting as his interpreter.  Other medical problems include PAD s/p recent lower extremity intervention on plavix and aspirin, DM, ESRD, Hep C cirrhosis, prior stroke with left side deficit not really ambulatory., hypertension.  These have been stable.  Past Medical History  Diagnosis Date  . Diabetes mellitus   . Renal disorder     HD patient  . Cirrhosis   . Alcohol abuse   . Stroke     left sided deficits  . Hypertension     Past Surgical History  Procedure Laterality Date  . Eye surgery    . Av fistula placement  2006    L upper arm  . Peripheral vascular catheterization N/A 06/06/2014    Procedure: Abdominal Aortogram;  Surgeon: Sherren Kerns, MD;  Location: [facility] INVASIVE CV LAB;  Service: Cardiovascular;  Laterality: N/A;  . Lower extremity angiogram Bilateral 06/06/2014    Procedure: Lower Extremity Angiogram;  Surgeon: Sherren Kerns, MD;  Location: [facility] INVASIVE CV LAB;  Service: Cardiovascular;  Laterality: Bilateral;  . Peripheral vascular catheterization Left 06/06/2014    Procedure: Peripheral Vascular Intervention;  Surgeon: Sherren Kerns, MD;  Location: [facility] INVASIVE CV LAB;  Service: Cardiovascular;  Laterality: Left;  pta drug coated x3    Social History History  Substance Use Topics  . Smoking status: Former Smoker    Quit date: 01/10/2006  . Smokeless tobacco: Never Used  . Alcohol Use: No     Comment: Former    Family History Family History  Problem Relation Age of Onset  . Diabetes Mother   . Hypertension Mother   . Diabetes Father   . Hypertension Father      Allergies  No Known Allergies   Current Facility-Administered Medications  Medication Dose Route Frequency Provider Last Rate Last Dose  . 0.9 %  sodium chloride infusion  250 mL Intravenous PRN Sherren Kerns, MD      . acetaminophen (TYLENOL) tablet 325-650 mg  325-650 mg Oral Q4H PRN Sherren Kerns, MD       Or  . acetaminophen (TYLENOL) suppository 325-650 mg  325-650 mg Rectal Q4H PRN Sherren Kerns, MD      . aspirin EC tablet 325 mg  325 mg Oral Daily Sherren Kerns, MD      . atorvastatin (LIPITOR) tablet 40 mg  40 mg Oral QHS Sherren Kerns, MD      . Melene Muller ON 07/20/2014] calcium acetate (PHOSLO) capsule 1,334 mg  1,334 mg Oral BID WC Sherren Kerns, MD      . carvedilol (COREG CR) 24 hr capsule 10 mg  10 mg Oral Daily Sherren Kerns, MD      . docusate sodium (COLACE) capsule 100 mg  100 mg Oral BID Sherren Kerns, MD      . gabapentin (NEURONTIN) capsule 100 mg  100 mg Oral QHS PRN Sherren Kerns, MD      . hydrALAZINE (APRESOLINE) injection 5 mg  5 mg Intravenous Q20 Min PRN Sherren Kerns, MD      . [  START ON 07/20/2014] insulin aspart (novoLOG) injection 0-9 Units  0-9 Units Subcutaneous TID WC Sherren Kerns, MD      . Melene Muller ON 07/20/2014] insulin aspart protamine- aspart (NOVOLOG MIX 70/30) injection 10 Units  10 Units Subcutaneous Q breakfast Sherren Kerns, MD      . labetalol (NORMODYNE,TRANDATE) injection 10 mg  10 mg Intravenous Q10 min PRN Sherren Kerns, MD      . lactulose (CHRONULAC) 10 GM/15ML solution 20 g  20 g Oral TID Sherren Kerns, MD      . metoprolol (LOPRESSOR) injection 2-5 mg  2-5 mg Intravenous Q2H PRN Sherren Kerns, MD      . morphine 2 MG/ML injection 2-5 mg  2-5 mg Intravenous Q1H PRN Sherren Kerns, MD      . ondansetron [facility]) injection 4 mg  4 mg Intravenous Q6H PRN Sherren Kerns, MD      . pantoprazole (PROTONIX) EC tablet 40 mg  40 mg Oral Daily Sherren Kerns, MD      . sodium chloride 0.9 %  injection 3 mL  3 mL Intravenous Q12H Sherren Kerns, MD      . sodium chloride 0.9 % injection 3 mL  3 mL Intravenous PRN Sherren Kerns, MD      . sorbitol 70 % solution 30 mL  30 mL Oral Daily PRN Sherren Kerns, MD       Current Outpatient Prescriptions  Medication Sig Dispense Refill  . aspirin 325 MG EC tablet Take 325 mg by mouth daily.    Marland Kitchen atorvastatin (LIPITOR) 40 MG tablet Take 40 mg by mouth at bedtime.    . calcium acetate (PHOSLO) 667 MG capsule Take 1,334 mg by mouth 2 (two) times daily with a meal.   0  . carvedilol (COREG CR) 10 MG 24 hr capsule Take 10 mg by mouth daily. Takes on days he does not have dialysis TUES, THURS AND SAT    . clopidogrel (PLAVIX) 75 MG tablet Take 75 mg by mouth daily.    Marland Kitchen gabapentin (NEURONTIN) 100 MG capsule Take 100 mg by mouth at bedtime as needed. FOR FEET PAIN  0  . insulin NPH-insulin regular (HUMULIN 70/30) (70-30) 100 UNIT/ML injection Inject 10 Units into the skin daily.     . sorbitol 70 % solution Take 30 mLs by mouth daily as needed.  1  . clindamycin (CLEOCIN) 150 MG capsule Take 3 capsules (450 mg total) by mouth 3 (three) times daily. (Patient not taking: Reported on 05/14/2014) 90 capsule 0  . docusate sodium (COLACE) 100 MG capsule Take 1 capsule (100 mg total) by mouth 2 (two) times daily. 10 capsule 0  . lactulose (CHRONULAC) 10 GM/15ML solution Take 30 mLs (20 g total) by mouth 3 (three) times daily. (Patient not taking: Reported on 04/24/2014) 960 mL 3    ROS:   Unable to obtain secondary to language  Physical Examination  Filed Vitals:   07/19/14 1841 07/19/14 1845  BP:  180/99  Pulse:  66  Temp:  97.5 F (36.4 C)  TempSrc:  Oral  Resp:  22  SpO2: 98% 100%    There is no weight on file to calculate BMI.  General:  Alert and oriented, no acute distress HEENT: Normal Neck: No bruit or JVD Pulmonary: Clear to auscultation bilaterally Cardiac: Regular Rate and Rhythm  Abdomen: Soft, non-tender,  non-distended, no mass Skin: No rash, petechiae diffuse left forearm and hand  from tight dressing Extremity Pulses:  2+ radial, brachial, femoral,absent dorsalis pedis, posterior tibial pulses bilaterally, palpable thrill in left arm AVF not actively bleeding, two areas of aneurysmal degeneration, no skin necrosis Musculoskeletal: No deformity or edema, healing wounds right foot, dry gangrene left 5th toe  Neurologic: Upper and lower extremity motor 3/5 and symmetric some spasticity on left  DATA:    CBC    Component Value Date/Time   WBC 6.5 04/24/2014 1726   RBC 4.24 04/24/2014 1726   HGB 12.9* 06/06/2014 0707   HCT 38.0* 06/06/2014 0707   PLT 68* 04/24/2014 1726   MCV 84.4 04/24/2014 1726   MCH 28.8 04/24/2014 1726   MCHC 34.1 04/24/2014 1726   RDW 14.3 04/24/2014 1726   LYMPHSABS 1.0 04/24/2014 1726   MONOABS 0.5 04/24/2014 1726   EOSABS 0.1 04/24/2014 1726   BASOSABS 0.0 04/24/2014 1726     BMET    Component Value Date/Time   NA 139 06/06/2014 0707   K 3.5 06/06/2014 0707   CL 95* 06/06/2014 0707   CO2 29 04/24/2014 1726   GLUCOSE 126* 06/06/2014 0707   BUN 28* 06/06/2014 0707   CREATININE 4.80* 06/06/2014 0707   CALCIUM 9.2 04/24/2014 1726   GFRNONAA 9* 04/24/2014 1726   GFRAA 11* 04/24/2014 1726       ASSESSMENT:  Bleeding episode left arm AVF now stopped.  High risk for rebleeding.   PLAN:  Admit with plan to plicate fistula and place diatek catheter on Monday.  Will let renal service know of admission in morning.  If rebleeds tonight plan would most likely be fistula ligation and diatek  Minimize left arm movement.  Leave ACE wrap dressing in place.  Hold Plavix ok to continue ASA   Fabienne Bruns, MD [facility] [place] Office: [phone] Pager: [phone]

## 2014-07-21 ENCOUNTER — Encounter (HOSPITAL_COMMUNITY): Admission: RE | Payer: Self-pay | Source: Ambulatory Visit

## 2014-07-21 ENCOUNTER — Encounter (HOSPITAL_COMMUNITY): Payer: Self-pay | Admitting: Anesthesiology

## 2014-07-21 ENCOUNTER — Inpatient Hospital Stay (HOSPITAL_COMMUNITY): Admission: RE | Admit: 2014-07-21 | Payer: Medicare Other | Source: Ambulatory Visit | Admitting: Vascular Surgery

## 2014-07-21 SURGERY — INSERTION OF DIALYSIS CATHETER
Anesthesia: General | Laterality: Left

## 2014-07-21 NOTE — Anesthesia Preprocedure Evaluation (Deleted)
Anesthesia Evaluation    Airway        Dental   Pulmonary former smoker,           Cardiovascular hypertension,      Neuro/Psych    GI/Hepatic   Endo/Other  diabetes  Renal/GU      Musculoskeletal   Abdominal   Peds  Hematology   Anesthesia Other Findings   Reproductive/Obstetrics                             Anesthesia Physical Anesthesia Plan Anesthesia Quick Evaluation  

## 2014-07-22 ENCOUNTER — Encounter: Payer: Self-pay | Admitting: Vascular Surgery

## 2014-07-22 NOTE — Progress Notes (Signed)
Utilization review completed- post discharge 

## 2014-07-24 ENCOUNTER — Ambulatory Visit (HOSPITAL_COMMUNITY)
Admission: RE | Admit: 2014-07-24 | Discharge: 2014-07-24 | Disposition: A | Discharge status: Home / Self Care | Payer: Medicare Other | Source: Ambulatory Visit | Attending: Vascular Surgery | Admitting: Vascular Surgery

## 2014-07-24 ENCOUNTER — Encounter: Payer: Self-pay | Admitting: Vascular Surgery

## 2014-07-24 ENCOUNTER — Ambulatory Visit (INDEPENDENT_AMBULATORY_CARE_PROVIDER_SITE_OTHER): Payer: Medicare Other | Admitting: Vascular Surgery

## 2014-07-24 VITALS — BP 164/67 | HR 61 | Temp 97.7°F | Resp 16 | Ht 68.0 in | Wt 170.0 lb

## 2014-07-24 DIAGNOSIS — Z01810 Encounter for preprocedural cardiovascular examination: Secondary | ICD-10-CM

## 2014-07-24 DIAGNOSIS — T82510D Breakdown (mechanical) of surgically created arteriovenous fistula, subsequent encounter: Secondary | ICD-10-CM | POA: Diagnosis not present

## 2014-07-24 DIAGNOSIS — I878 Other specified disorders of veins: Secondary | ICD-10-CM | POA: Insufficient documentation

## 2014-07-24 NOTE — Progress Notes (Signed)
[facility] [place] HISTORY AND PHYSICAL   History of Present Illness:  Patient is a 69 y.o. year old male who presents for evaluation of multiple toe ulcers as well as recent episode of bleeding from a left upper arm AV fistula. The patient was admitted to the hospital over the weekend for bleeding from his left upper arm AV fistula. He was placed on the OR schedule for a plication. However the patient signed out AGAINST MEDICAL ADVICE. He returns today for further follow-up. He has also previously undergone left superficial femoral artery angioplasty in May for nonhealing wounds on the foot. 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  Other medical problems include end-stage renal disease on dialysis, diabetes, cirrhosis, hepatitis C, prior stroke with left-sided deficits, hypertension all of which are currently stable. He is on aspirin and Plavix. He has not had further bleeding episodes from the fistula since 5 days ago. The patient does not speak English very well and most of the interview was conducted through his son.  Past Medical History  Diagnosis Date  . Diabetes mellitus   . Renal disorder     HD patient  . Cirrhosis   . Alcohol abuse   . Stroke     left sided deficits  . Hypertension     Past Surgical History  Procedure Laterality Date  . Eye surgery    . Av fistula placement  2006    L upper arm  . Peripheral vascular catheterization N/A 06/06/2014    Procedure: Abdominal Aortogram;  Surgeon: Sherren Kerns, MD;  Location: [facility] INVASIVE CV LAB;  Service: Cardiovascular;  Laterality: N/A;  . Lower extremity angiogram Bilateral 06/06/2014    Procedure: Lower Extremity Angiogram;  Surgeon: Sherren Kerns, MD;  Location: [facility] INVASIVE CV LAB;  Service: Cardiovascular;  Laterality: Bilateral;  . Peripheral vascular catheterization Left 06/06/2014    Procedure: Peripheral Vascular Intervention;  Surgeon: Sherren Kerns, MD;  Location: [facility] INVASIVE CV LAB;   Service: Cardiovascular;  Laterality: Left;  pta drug coated x3    Social History History  Substance Use Topics  . Smoking status: Former Smoker    Quit date: 01/10/2006  . Smokeless tobacco: Never Used  . Alcohol Use: No     Comment: Former    Family History Family History  Problem Relation Age of Onset  . Diabetes Mother   . Hypertension Mother   . Diabetes Father   . Hypertension Father     Allergies  No Known Allergies   Current Outpatient Prescriptions  Medication Sig Dispense Refill  . aspirin 325 MG EC tablet Take 325 mg by mouth daily.    Marland Kitchen atorvastatin (LIPITOR) 40 MG tablet Take 40 mg by mouth at bedtime.    . calcium acetate (PHOSLO) 667 MG capsule Take 1,334 mg by mouth 2 (two) times daily with a meal.   0  . carvedilol (COREG CR) 10 MG 24 hr capsule Take 10 mg by mouth daily. Takes on days he does not have dialysis TUES, THURS AND SAT    . clopidogrel (PLAVIX) 75 MG tablet Take 75 mg by mouth daily.    Marland Kitchen docusate sodium (COLACE) 100 MG capsule Take 1 capsule (100 mg total) by mouth 2 (two) times daily. 10 capsule 0  . gabapentin (NEURONTIN) 100 MG capsule Take 100 mg by mouth at bedtime as needed. FOR FEET PAIN  0  . insulin NPH-insulin regular (HUMULIN 70/30) (70-30) 100 UNIT/ML injection Inject 10 Units  into the skin daily.     . sorbitol 70 % solution Take 30 mLs by mouth daily as needed.  1  . clindamycin (CLEOCIN) 150 MG capsule Take 3 capsules (450 mg total) by mouth 3 (three) times daily. (Patient not taking: Reported on 05/14/2014) 90 capsule 0  . lactulose (CHRONULAC) 10 GM/15ML solution Take 30 mLs (20 g total) by mouth 3 (three) times daily. (Patient not taking: Reported on 04/24/2014) 960 mL 3   No current facility-administered medications for this visit.    ROS:   General:  No weight loss, Fever, chills  Neurologic: No dizziness, blackouts, seizures. No recent symptoms of stroke or mini- stroke. No recent episodes of slurred speech, or temporary  blindness.  Cardiac: No recent episodes of chest pain/pressure, no shortness of breath at rest.  No shortness of breath with exertion.  Denies history of atrial fibrillation or irregular heartbeat  Urinary: [ x] chronic Kidney disease, [x ] on HD - [ ]  MWF or [ ]  TTHS, [ ]  Burning with urination, [ ]  Frequent urination, [ ]  Difficulty urinating;     Physical Examination  Filed Vitals:   07/24/14 0900 07/24/14 0905  BP: 173/77 164/67  Pulse: 61   Temp: 97.7 F (36.5 C)   TempSrc: Oral   Resp: 16   Height: 5\' 8"  (1.727 m)   Weight: 170 lb (77.111 kg)   SpO2: 100%     Body mass index is 25.85 kg/(m^2).  General:  Alert and oriented, no acute distress HEENT: Normal Neck: No bruit or JVD Pulmonary: Clear to auscultation bilaterally Cardiac: Regular Rate and Rhythm without murmur Abdomen: Soft, non-tender, non-distended, no mass Skin: No rash, left upper extremity AV fistula with 2 areas of aneurysmal dilation proximal 4 cm each thinned out whitish shiny appearing skin over each aneurysm no significant eschar, dry gangrenous changes left fifth toe right third toe overall appear to be healing Extremity Pulses:  Absent dorsalis pedis, posterior tibial pulses bilaterally Musculoskeletal: No deformity or edema  Neurologic: Upper and lower extremity motor 5/5 and symmetric  DATA:  Patient had a vein mapping ultrasound of his right upper extremity today. Veins in the right arm are inadequate for creation of a fistula.   ASSESSMENT:  I discussed with the patient and his son today plication revision of the left upper arm AV fistula and placement of a temporary dialysis catheter until this is healed. The patient is very resistant to having a dialysis catheter placed. They asked whether or not we can go ahead and place a new access in the right arm to prepare for failure of the left arm access. He does not have adequate vein for a fistula. I did discuss with them potentially placing a graft.  However this is not going to do anything to prevent him from bleeding. My recommendation to them was to undergo plication with only a temporary Diatek. The patient wishes to think about this versus having a new graft placed and the old fistula ligated.   PLAN:  The patient will call if he wishes to have her procedure scheduled in the near future. I did discuss with the patient and his family today that he is at risk of recurrent bleeding and possible death if he hemorrhages significantly  Fabienne Bruns, MD [facility] [place] Office: [phone] Pager: [phone]

## 2014-07-28 ENCOUNTER — Other Ambulatory Visit: Payer: Self-pay

## 2014-07-30 ENCOUNTER — Encounter: Payer: Self-pay | Admitting: Vascular Surgery

## 2014-08-01 ENCOUNTER — Encounter (HOSPITAL_COMMUNITY): Payer: Self-pay | Admitting: *Deleted

## 2014-08-01 NOTE — Progress Notes (Signed)
Spoke with pt's son, Amor Wiesner for pre-op call. He was able to verify pt's allergies, meds, medical and surgical histories. Gave him pre-op instructions that included time of arrival of 5:30 AM, NPO after midnight, meds to take with small sip of water are Aspirin and Carvedilol. He voiced understanding.

## 2014-08-04 ENCOUNTER — Ambulatory Visit (HOSPITAL_COMMUNITY): Payer: Medicare Other

## 2014-08-04 ENCOUNTER — Ambulatory Visit (HOSPITAL_COMMUNITY)
Admission: RE | Admit: 2014-08-04 | Discharge: 2014-08-04 | Disposition: A | Discharge status: Home / Self Care | Payer: Medicare Other | Source: Ambulatory Visit | Attending: Vascular Surgery | Admitting: Vascular Surgery

## 2014-08-04 ENCOUNTER — Ambulatory Visit: Payer: Medicare Other | Admitting: Internal Medicine

## 2014-08-04 ENCOUNTER — Encounter (HOSPITAL_COMMUNITY)
Admission: RE | Disposition: A | Discharge status: Home / Self Care | Payer: Self-pay | Source: Ambulatory Visit | Attending: Vascular Surgery

## 2014-08-04 ENCOUNTER — Ambulatory Visit (HOSPITAL_COMMUNITY): Payer: Medicare Other | Admitting: Anesthesiology

## 2014-08-04 ENCOUNTER — Encounter (HOSPITAL_COMMUNITY): Payer: Self-pay | Admitting: *Deleted

## 2014-08-04 DIAGNOSIS — Z992 Dependence on renal dialysis: Secondary | ICD-10-CM | POA: Diagnosis not present

## 2014-08-04 DIAGNOSIS — I2510 Atherosclerotic heart disease of native coronary artery without angina pectoris: Secondary | ICD-10-CM | POA: Insufficient documentation

## 2014-08-04 DIAGNOSIS — Z794 Long term (current) use of insulin: Secondary | ICD-10-CM | POA: Diagnosis not present

## 2014-08-04 DIAGNOSIS — I252 Old myocardial infarction: Secondary | ICD-10-CM | POA: Insufficient documentation

## 2014-08-04 DIAGNOSIS — I120 Hypertensive chronic kidney disease with stage 5 chronic kidney disease or end stage renal disease: Secondary | ICD-10-CM | POA: Diagnosis not present

## 2014-08-04 DIAGNOSIS — B1920 Unspecified viral hepatitis C without hepatic coma: Secondary | ICD-10-CM | POA: Diagnosis not present

## 2014-08-04 DIAGNOSIS — Z87891 Personal history of nicotine dependence: Secondary | ICD-10-CM | POA: Insufficient documentation

## 2014-08-04 DIAGNOSIS — Z7982 Long term (current) use of aspirin: Secondary | ICD-10-CM | POA: Insufficient documentation

## 2014-08-04 DIAGNOSIS — K7460 Unspecified cirrhosis of liver: Secondary | ICD-10-CM | POA: Insufficient documentation

## 2014-08-04 DIAGNOSIS — Z8673 Personal history of transient ischemic attack (TIA), and cerebral infarction without residual deficits: Secondary | ICD-10-CM | POA: Insufficient documentation

## 2014-08-04 DIAGNOSIS — N186 End stage renal disease: Secondary | ICD-10-CM | POA: Diagnosis not present

## 2014-08-04 DIAGNOSIS — E119 Type 2 diabetes mellitus without complications: Secondary | ICD-10-CM | POA: Diagnosis not present

## 2014-08-04 DIAGNOSIS — Z7902 Long term (current) use of antithrombotics/antiplatelets: Secondary | ICD-10-CM | POA: Insufficient documentation

## 2014-08-04 DIAGNOSIS — T82838A Hemorrhage of vascular prosthetic devices, implants and grafts, initial encounter: Secondary | ICD-10-CM | POA: Insufficient documentation

## 2014-08-04 DIAGNOSIS — Z419 Encounter for procedure for purposes other than remedying health state, unspecified: Secondary | ICD-10-CM

## 2014-08-04 DIAGNOSIS — T82898A Other specified complication of vascular prosthetic devices, implants and grafts, initial encounter: Secondary | ICD-10-CM

## 2014-08-04 DIAGNOSIS — Z95828 Presence of other vascular implants and grafts: Secondary | ICD-10-CM

## 2014-08-04 HISTORY — DX: Inflammatory liver disease, unspecified: K75.9

## 2014-08-04 HISTORY — DX: Constipation, unspecified: K59.00

## 2014-08-04 HISTORY — PX: REVISON OF ARTERIOVENOUS FISTULA: SHX6074

## 2014-08-04 HISTORY — PX: INSERTION OF DIALYSIS CATHETER: SHX1324

## 2014-08-04 LAB — POCT I-STAT 4, (NA,K, GLUC, HGB,HCT)
GLUCOSE: 78 mg/dL (ref 65–99)
HCT: 33 % — ABNORMAL LOW (ref 39.0–52.0)
HEMOGLOBIN: 11.2 g/dL — AB (ref 13.0–17.0)
POTASSIUM: 3.4 mmol/L — AB (ref 3.5–5.1)
Sodium: 138 mmol/L (ref 135–145)

## 2014-08-04 LAB — GLUCOSE, CAPILLARY
GLUCOSE-CAPILLARY: 81 mg/dL (ref 65–99)
Glucose-Capillary: 98 mg/dL (ref 65–99)

## 2014-08-04 SURGERY — REVISON OF ARTERIOVENOUS FISTULA
Anesthesia: Monitor Anesthesia Care | Site: Neck | Laterality: Right

## 2014-08-04 MED ORDER — LIDOCAINE HCL (CARDIAC) 20 MG/ML IV SOLN
INTRAVENOUS | Status: DC | PRN
  Administered 2014-08-04: 60 mg via INTRAVENOUS

## 2014-08-04 MED ORDER — ONDANSETRON HCL 4 MG/2ML IJ SOLN: 4.0000 mg | Freq: Once | INTRAMUSCULAR | Status: DC | PRN

## 2014-08-04 MED ORDER — EPHEDRINE SULFATE 50 MG/ML IJ SOLN
INTRAMUSCULAR | Status: DC | PRN
  Administered 2014-08-04: 20 mg via INTRAVENOUS
  Administered 2014-08-04: 15 mg via INTRAVENOUS
  Administered 2014-08-04: 20 mg via INTRAVENOUS

## 2014-08-04 MED ORDER — LIDOCAINE HCL (PF) 1 % IJ SOLN
INTRAMUSCULAR | Status: AC
  Filled 2014-08-04: qty 30

## 2014-08-04 MED ORDER — CHLORHEXIDINE GLUCONATE CLOTH 2 % EX PADS: 6.0000 | MEDICATED_PAD | Freq: Once | CUTANEOUS | Status: DC

## 2014-08-04 MED ORDER — MIDAZOLAM HCL 2 MG/2ML IJ SOLN
INTRAMUSCULAR | Status: AC
  Filled 2014-08-04: qty 2

## 2014-08-04 MED ORDER — HEPARIN SODIUM (PORCINE) 1000 UNIT/ML IJ SOLN
INTRAMUSCULAR | Status: AC
  Filled 2014-08-04: qty 1

## 2014-08-04 MED ORDER — HEPARIN SODIUM (PORCINE) 1000 UNIT/ML IJ SOLN
INTRAMUSCULAR | Status: DC | PRN
  Administered 2014-08-04: 4.6 mL

## 2014-08-04 MED ORDER — PROPOFOL 10 MG/ML IV BOLUS
INTRAVENOUS | Status: AC
  Filled 2014-08-04: qty 20

## 2014-08-04 MED ORDER — SODIUM CHLORIDE 0.9 % IV SOLN
INTRAVENOUS | Status: DC
  Administered 2014-08-04: 07:00:00 via INTRAVENOUS

## 2014-08-04 MED ORDER — SODIUM CHLORIDE 0.9 % IR SOLN
Status: DC | PRN
  Administered 2014-08-04: 500 mL

## 2014-08-04 MED ORDER — PROTAMINE SULFATE 10 MG/ML IV SOLN
INTRAVENOUS | Status: DC | PRN
  Administered 2014-08-04: 50 mg via INTRAVENOUS

## 2014-08-04 MED ORDER — FENTANYL CITRATE (PF) 250 MCG/5ML IJ SOLN
INTRAMUSCULAR | Status: AC
  Filled 2014-08-04: qty 5

## 2014-08-04 MED ORDER — 0.9 % SODIUM CHLORIDE (POUR BTL) OPTIME
TOPICAL | Status: DC | PRN
  Administered 2014-08-04: 1000 mL

## 2014-08-04 MED ORDER — LIDOCAINE HCL (PF) 1 % IJ SOLN
INTRAMUSCULAR | Status: DC | PRN
  Administered 2014-08-04: 8 mL

## 2014-08-04 MED ORDER — PROPOFOL 10 MG/ML IV BOLUS
INTRAVENOUS | Status: DC | PRN
  Administered 2014-08-04: 30 mg via INTRAVENOUS
  Administered 2014-08-04: 150 mg via INTRAVENOUS

## 2014-08-04 MED ORDER — THROMBIN 20000 UNITS EX SOLR
CUTANEOUS | Status: AC
  Filled 2014-08-04: qty 20000

## 2014-08-04 MED ORDER — CARVEDILOL PHOSPHATE ER 10 MG PO CP24
10.0000 mg | ORAL_CAPSULE | Freq: Once | ORAL | Status: AC
  Administered 2014-08-04: 10 mg via ORAL
  Filled 2014-08-04: qty 1

## 2014-08-04 MED ORDER — SUCCINYLCHOLINE CHLORIDE 20 MG/ML IJ SOLN
INTRAMUSCULAR | Status: AC
  Filled 2014-08-04: qty 1

## 2014-08-04 MED ORDER — HEPARIN SODIUM (PORCINE) 1000 UNIT/ML IJ SOLN
INTRAMUSCULAR | Status: DC | PRN
  Administered 2014-08-04: 5000 [IU] via INTRAVENOUS

## 2014-08-04 MED ORDER — FENTANYL CITRATE (PF) 100 MCG/2ML IJ SOLN
INTRAMUSCULAR | Status: DC | PRN
  Administered 2014-08-04 (×2): 25 ug via INTRAVENOUS
  Administered 2014-08-04: 100 ug via INTRAVENOUS

## 2014-08-04 MED ORDER — PHENYLEPHRINE HCL 10 MG/ML IJ SOLN
INTRAMUSCULAR | Status: DC | PRN
  Administered 2014-08-04 (×2): 80 ug via INTRAVENOUS
  Administered 2014-08-04 (×3): 120 ug via INTRAVENOUS
  Administered 2014-08-04 (×4): 80 ug via INTRAVENOUS

## 2014-08-04 MED ORDER — OXYCODONE HCL 5 MG PO TABS: 5.0000 mg | ORAL_TABLET | Freq: Four times a day (QID) | ORAL | Status: DC | PRN

## 2014-08-04 MED ORDER — HYDROMORPHONE HCL 1 MG/ML IJ SOLN: 0.5000 mg | INTRAMUSCULAR | Status: DC | PRN

## 2014-08-04 MED ORDER — EPHEDRINE SULFATE 50 MG/ML IJ SOLN
INTRAMUSCULAR | Status: AC
  Filled 2014-08-04: qty 1

## 2014-08-04 MED ORDER — DEXTROSE 5 % IV SOLN
1.5000 g | INTRAVENOUS | Status: DC
  Filled 2014-08-04: qty 1.5

## 2014-08-04 MED ORDER — DEXTROSE 5 % IV SOLN
50.0000 g | INTRAVENOUS | Status: DC | PRN
  Administered 2014-08-04: 1.5 g via INTRAVENOUS

## 2014-08-04 SURGICAL SUPPLY — 64 items
BAG DECANTER FOR FLEXI CONT (MISCELLANEOUS) ×3 IMPLANT
BANDAGE ELASTIC 4 VELCRO ST LF (GAUZE/BANDAGES/DRESSINGS) ×3 IMPLANT
BIOPATCH RED 1 DISK 7.0 (GAUZE/BANDAGES/DRESSINGS) ×3 IMPLANT
BNDG GAUZE ELAST 4 BULKY (GAUZE/BANDAGES/DRESSINGS) ×3 IMPLANT
CANISTER SUCTION 2500CC (MISCELLANEOUS) ×3 IMPLANT
CANNULA VESSEL 3MM 2 BLNT TIP (CANNULA) ×3 IMPLANT
CATH CANNON HEMO 15F 50CM (CATHETERS) IMPLANT
CATH CANNON HEMO 15FR 19 (HEMODIALYSIS SUPPLIES) IMPLANT
CATH CANNON HEMO 15FR 23CM (HEMODIALYSIS SUPPLIES) ×3 IMPLANT
CATH CANNON HEMO 15FR 31CM (HEMODIALYSIS SUPPLIES) IMPLANT
CATH CANNON HEMO 15FR 32CM (HEMODIALYSIS SUPPLIES) IMPLANT
CATH STRAIGHT 5FR 65CM (CATHETERS) ×3 IMPLANT
CHLORAPREP W/TINT 26ML (MISCELLANEOUS) ×3 IMPLANT
CLIP TI MEDIUM 6 (CLIP) ×3 IMPLANT
CLIP TI WIDE RED SMALL 6 (CLIP) ×6 IMPLANT
COUNTER NEEDLE 20 DBL MAG RED (NEEDLE) ×3 IMPLANT
COVER PROBE W GEL 5X96 (DRAPES) ×3 IMPLANT
DECANTER SPIKE VIAL GLASS SM (MISCELLANEOUS) ×3 IMPLANT
DRAIN PENROSE 1/4X12 LTX STRL (WOUND CARE) ×3 IMPLANT
DRAPE C-ARM 42X72 X-RAY (DRAPES) ×3 IMPLANT
DRAPE CHEST BREAST 15X10 FENES (DRAPES) ×3 IMPLANT
ELECT REM PT RETURN 9FT ADLT (ELECTROSURGICAL) ×3
ELECTRODE REM PT RTRN 9FT ADLT (ELECTROSURGICAL) ×2 IMPLANT
GAUZE SPONGE 2X2 8PLY STRL LF (GAUZE/BANDAGES/DRESSINGS) ×2 IMPLANT
GAUZE SPONGE 4X4 16PLY XRAY LF (GAUZE/BANDAGES/DRESSINGS) ×3 IMPLANT
GEL ULTRASOUND 20GR AQUASONIC (MISCELLANEOUS) IMPLANT
GLOVE BIO SURGEON STRL SZ7.5 (GLOVE) ×6 IMPLANT
GLOVE BIOGEL M 6.5 STRL (GLOVE) ×12 IMPLANT
GLOVE BIOGEL PI IND STRL 6.5 (GLOVE) ×2 IMPLANT
GLOVE BIOGEL PI IND STRL 8 (GLOVE) ×2 IMPLANT
GLOVE BIOGEL PI INDICATOR 6.5 (GLOVE) ×1
GLOVE BIOGEL PI INDICATOR 8 (GLOVE) ×1
GLOVE SURG SS PI 7.0 STRL IVOR (GLOVE) ×9 IMPLANT
GOWN STRL REUS W/ TWL LRG LVL3 (GOWN DISPOSABLE) ×8 IMPLANT
GOWN STRL REUS W/ TWL XL LVL3 (GOWN DISPOSABLE) ×6 IMPLANT
GOWN STRL REUS W/TWL LRG LVL3 (GOWN DISPOSABLE) ×4
GOWN STRL REUS W/TWL XL LVL3 (GOWN DISPOSABLE) ×3
KIT BASIN OR (CUSTOM PROCEDURE TRAY) ×3 IMPLANT
KIT ROOM TURNOVER OR (KITS) ×3 IMPLANT
LIQUID BAND (GAUZE/BANDAGES/DRESSINGS) ×6 IMPLANT
LOOP VESSEL MINI RED (MISCELLANEOUS) IMPLANT
NEEDLE 18GX1X1/2 (RX/OR ONLY) (NEEDLE) ×3 IMPLANT
NEEDLE HYPO 25GX1X1/2 BEV (NEEDLE) ×3 IMPLANT
NS IRRIG 1000ML POUR BTL (IV SOLUTION) ×3 IMPLANT
PACK CV ACCESS (CUSTOM PROCEDURE TRAY) ×3 IMPLANT
PACK SURGICAL SETUP 50X90 (CUSTOM PROCEDURE TRAY) IMPLANT
PAD ARMBOARD 7.5X6 YLW CONV (MISCELLANEOUS) ×6 IMPLANT
SET MICROPUNCTURE 5F STIFF (MISCELLANEOUS) IMPLANT
SPONGE GAUZE 2X2 STER 10/PKG (GAUZE/BANDAGES/DRESSINGS) ×1
SPONGE SURGIFOAM ABS GEL 100 (HEMOSTASIS) IMPLANT
SUT ETHILON 3 0 PS 1 (SUTURE) ×3 IMPLANT
SUT PROLENE 5 0 C 1 24 (SUTURE) ×12 IMPLANT
SUT PROLENE 7 0 BV 1 (SUTURE) ×3 IMPLANT
SUT VIC AB 3-0 SH 27 (SUTURE) ×2
SUT VIC AB 3-0 SH 27X BRD (SUTURE) ×4 IMPLANT
SUT VICRYL 4-0 PS2 18IN ABS (SUTURE) ×9 IMPLANT
SYR 20CC LL (SYRINGE) ×6 IMPLANT
SYR 5ML LL (SYRINGE) ×3 IMPLANT
SYR CONTROL 10ML LL (SYRINGE) ×3 IMPLANT
SYRINGE 10CC LL (SYRINGE) ×3 IMPLANT
TAPE CLOTH SURG 4X10 WHT LF (GAUZE/BANDAGES/DRESSINGS) ×3 IMPLANT
UNDERPAD 30X30 INCONTINENT (UNDERPADS AND DIAPERS) ×3 IMPLANT
WATER STERILE IRR 1000ML POUR (IV SOLUTION) ×3 IMPLANT
WIRE AMPLATZ SS-J .035X180CM (WIRE) ×3 IMPLANT

## 2014-08-04 NOTE — Interval H&P Note (Signed)
History and Physical Interval Note:  08/04/2014 7:23 AM  Greg [name]  has presented today for surgery, with the diagnosis of End Stage Renal Disease N18.6  The various methods of treatment have been discussed with the patient and family. After consideration of risks, benefits and other options for treatment, the patient has consented to  Procedure(s): PLICATION OF ARTERIOVENOUS FISTULA (Left) INSERTION OF DIALYSIS CATHETER as a surgical intervention .  The patient's history has been reviewed, patient examined, no change in status, stable for surgery.  I have reviewed the patient's chart and labs.  Questions were answered to the patient's satisfaction.     Fabienne Bruns

## 2014-08-04 NOTE — Anesthesia Preprocedure Evaluation (Addendum)
Anesthesia Evaluation  Patient identified by MRN, date of birth, ID band Patient awake    Reviewed: Allergy & Precautions, NPO status , Patient's Chart, lab work & pertinent test results  Airway Mallampati: I  TM Distance: >3 FB Neck ROM: full  Mouth opening: Limited Mouth Opening  Dental  (+) Poor Dentition, Dental Advidsory Given   Pulmonary former smoker,  breath sounds clear to auscultation  Pulmonary exam normal       Cardiovascular hypertension, + CAD, + Past MI and + Peripheral Vascular Disease Normal cardiovascular examRhythm:regular     Neuro/Psych CVA, Residual Symptoms    GI/Hepatic (+) Hepatitis -, C  Endo/Other  diabetes, Type 2, Insulin Dependent  Renal/GU ESRF and DialysisRenal disease     Musculoskeletal   Abdominal   Peds  Hematology   Anesthesia Other Findings   Reproductive/Obstetrics                           Anesthesia Physical Anesthesia Plan  ASA: III  Anesthesia Plan: General and MAC   Post-op Pain Management:    Induction: Intravenous  Airway Management Planned: LMA and Oral ETT  Additional Equipment:   Intra-op Plan:   Post-operative Plan: Extubation in OR  Informed Consent: I have reviewed the patients History and Physical, chart, labs and discussed the procedure including the risks, benefits and alternatives for the proposed anesthesia with the patient or authorized representative who has indicated his/her understanding and acceptance.     Plan Discussed with: CRNA, Anesthesiologist and Surgeon  Anesthesia Plan Comments:         Anesthesia Quick Evaluation

## 2014-08-04 NOTE — Op Note (Signed)
Procedure: #1 ultrasound neck #2 insertion of Diatek catheter #3 plication of left upper extremity AV fistula  Preoperative diagnosis: Aneurysmal degeneration left arm AV fistula   Postoperative diagnosis: Same  Anesthesia: Gen.  Assistant: Doreatha Massed, PA-C  Operative findings: #1 plication of proximal and middle third of left upper extremity AV fistula           #2 23 cm Diatek catheter left internal jugular vein  Operative details: After obtaining informed consent, the patient was taken to the operating room. The patient was placed in supine position on the operating room table. After administration of general anesthesia, the patient's entire neck and chest were prepped and draped in usual sterile fashion. The patient was placed in Trendelenburg position. Ultrasound showed the right internal jugular vein to be small.  The left internal jugular vein was of much better quality.  Ultrasound was used to identify the patient's right internal jugular vein. This had normal compressibility and respiratory variation.Using ultrasound guidance, the right internal jugular vein was successfully cannulated.  A 0.035 J-tipped guidewire was threaded into the right internal jugular vein and into the superior vena cava followed by the inferior vena cava under fluoroscopic guidance.   There was some tortuosity to the innominate system so I placed a 5 Fr straight catheter over the wire and exchanged it for an Amplatz wire.  Next sequential 12 and 14 dilators were placed over the guidewire into the right atrium.  A 16 French dilator with a peel-away sheath was then placed over the guidewire into the right atrium.   The guidewire and dilator were removed. A 23 cm Diatek catheter was then placed through the peel away sheath into the right atrium.  The catheter was then tunneled subcutaneously, cut to length, and the hub attached. The catheter was noted to flush and draw easily. The catheter was inspected under  fluoroscopy and found with its tip to be in the right atrium without any kinks throughout its course. The catheter was sutured to the skin with nylon sutures. The neck insertion site was closed with Vicryl stitch. The catheter was then loaded with concentrated Heparin solution. A dry sterile dressing was applied.  Next attention was turned to the left upper extremity. The entire left upper extremity was prepped and draped in usual sterile fashion. A longitudinal incision was made over the central third and proximal third of the fistula incorporating an ellipse of skin on both areas of aneurysmal degeneration. The fistula was dissected free circumferentially throughout its course. The more proximal aneurysm was multilobulated and very thin walled. The more distal aneurysm was smaller. The patient was given 5000 units of intravenous heparin. The fistula was clamped proximally and distally above and below the aneurysms. The aneurysm was opened longitudinally and the redundant segment fistula excised. Each area of resection was then reapproximated using a running 5-0 Prolene suture. The clamps were removed and flow restored to the fistula. Hemostasis was obtained with administration of 50 mg of protamine and one repair stitch at the proximal anastomosis. Some of the redundant skin was then resected. The subcutaneous tissues were reapproximated using a running 3-0 Vicryl suture. The skin was closed with a 4 0 Vicryl subcuticular stitch. Dermabond was applied to the skin incision.  The fistula had a palpable thrill at the end of the case.  The patient tolerated procedure well and there were no complications. Instrument sponge and needle counts were correct at the end of the case. The patient was taken to  the recovery room in stable condition. Chest x-ray will be obtained in the recovery room.  Fabienne Bruns, MD [facility] Office: [phone] Pager: [phone]

## 2014-08-04 NOTE — Discharge Instructions (Signed)
° ° °  08/04/2014 Greg [name] [id number] 1945/12/21  Surgeon(s): Sherren Kerns, MD  Procedure(s): PLICATION OF  LEFT UPPER ARM ARTERIOVENOUS FISTULA INSERTION OF DIALYSIS CATHETER RIGHT INTERNAL JUGULAR VEIN  x Do not stick fistula for 6 weeks

## 2014-08-04 NOTE — H&P (View-Only) (Signed)
[facility] HISTORY AND PHYSICAL   History of Present Illness:  Patient is a 69 y.o. year old male who presents for evaluation of multiple toe ulcers as well as recent episode of bleeding from a left upper arm AV fistula. The patient was admitted to the hospital over the weekend for bleeding from his left upper arm AV fistula. He was placed on the OR schedule for a plication. However the patient signed out AGAINST MEDICAL ADVICE. He returns today for further follow-up. He has also previously undergone left superficial femoral artery angioplasty in May for nonhealing wounds on the foot. 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  Other medical problems include end-stage renal disease on dialysis, diabetes, cirrhosis, hepatitis C, prior stroke with left-sided deficits, hypertension all of which are currently stable. He is on aspirin and Plavix. He has not had further bleeding episodes from the fistula since 5 days ago. The patient does not speak English very well and most of the interview was conducted through his son.  Past Medical History  Diagnosis Date  . Diabetes mellitus   . Renal disorder     HD patient  . Cirrhosis   . Alcohol abuse   . Stroke     left sided deficits  . Hypertension     Past Surgical History  Procedure Laterality Date  . Eye surgery    . Av fistula placement  2006    L upper arm  . Peripheral vascular catheterization N/A 06/06/2014    Procedure: Abdominal Aortogram;  Surgeon: Braxtyn Bojarski E Lindwood Mogel, MD;  Location: [facility] INVASIVE CV LAB;  Service: Cardiovascular;  Laterality: N/A;  . Lower extremity angiogram Bilateral 06/06/2014    Procedure: Lower Extremity Angiogram;  Surgeon: Linden Tagliaferro E Braylei Totino, MD;  Location: [facility] INVASIVE CV LAB;  Service: Cardiovascular;  Laterality: Bilateral;  . Peripheral vascular catheterization Left 06/06/2014    Procedure: Peripheral Vascular Intervention;  Surgeon: Abron Neddo E Denaly Gatling, MD;  Location: [facility] INVASIVE CV LAB;   Service: Cardiovascular;  Laterality: Left;  pta drug coated x3    Social History History  Substance Use Topics  . Smoking status: Former Smoker    Quit date: 01/10/2006  . Smokeless tobacco: Never Used  . Alcohol Use: No     Comment: Former    Family History Family History  Problem Relation Age of Onset  . Diabetes Mother   . Hypertension Mother   . Diabetes Father   . Hypertension Father     Allergies  No Known Allergies   Current Outpatient Prescriptions  Medication Sig Dispense Refill  . aspirin 325 MG EC tablet Take 325 mg by mouth daily.    . atorvastatin (LIPITOR) 40 MG tablet Take 40 mg by mouth at bedtime.    . calcium acetate (PHOSLO) 667 MG capsule Take 1,334 mg by mouth 2 (two) times daily with a meal.   0  . carvedilol (COREG CR) 10 MG 24 hr capsule Take 10 mg by mouth daily. Takes on days he does not have dialysis TUES, THURS AND SAT    . clopidogrel (PLAVIX) 75 MG tablet Take 75 mg by mouth daily.    . docusate sodium (COLACE) 100 MG capsule Take 1 capsule (100 mg total) by mouth 2 (two) times daily. 10 capsule 0  . gabapentin (NEURONTIN) 100 MG capsule Take 100 mg by mouth at bedtime as needed. FOR FEET PAIN  0  . insulin NPH-insulin regular (HUMULIN 70/30) (70-30) 100 UNIT/ML injection Inject 10 Units   into the skin daily.     . sorbitol 70 % solution Take 30 mLs by mouth daily as needed.  1  . clindamycin (CLEOCIN) 150 MG capsule Take 3 capsules (450 mg total) by mouth 3 (three) times daily. (Patient not taking: Reported on 05/14/2014) 90 capsule 0  . lactulose (CHRONULAC) 10 GM/15ML solution Take 30 mLs (20 g total) by mouth 3 (three) times daily. (Patient not taking: Reported on 04/24/2014) 960 mL 3   No current facility-administered medications for this visit.    ROS:   General:  No weight loss, Fever, chills  Neurologic: No dizziness, blackouts, seizures. No recent symptoms of stroke or mini- stroke. No recent episodes of slurred speech, or temporary  blindness.  Cardiac: No recent episodes of chest pain/pressure, no shortness of breath at rest.  No shortness of breath with exertion.  Denies history of atrial fibrillation or irregular heartbeat  Urinary: [ x] chronic Kidney disease, [x ] on HD - [ ]  MWF or [ ]  TTHS, [ ]  Burning with urination, [ ]  Frequent urination, [ ]  Difficulty urinating;     Physical Examination  Filed Vitals:   07/24/14 0900 07/24/14 0905  BP: 173/77 164/67  Pulse: 61   Temp: 97.7 F (36.5 C)   TempSrc: Oral   Resp: 16   Height: 5\' 8"  (1.727 m)   Weight: 170 lb (77.111 kg)   SpO2: 100%     Body mass index is 25.85 kg/(m^2).  General:  Alert and oriented, no acute distress HEENT: Normal Neck: No bruit or JVD Pulmonary: Clear to auscultation bilaterally Cardiac: Regular Rate and Rhythm without murmur Abdomen: Soft, non-tender, non-distended, no mass Skin: No rash, left upper extremity AV fistula with 2 areas of aneurysmal dilation proximal 4 cm each thinned out whitish shiny appearing skin over each aneurysm no significant eschar, dry gangrenous changes left fifth toe right third toe overall appear to be healing Extremity Pulses:  Absent dorsalis pedis, posterior tibial pulses bilaterally Musculoskeletal: No deformity or edema  Neurologic: Upper and lower extremity motor 5/5 and symmetric  DATA:  Patient had a vein mapping ultrasound of his right upper extremity today. Veins in the right arm are inadequate for creation of a fistula.   ASSESSMENT:  I discussed with the patient and his son today plication revision of the left upper arm AV fistula and placement of a temporary dialysis catheter until this is healed. The patient is very resistant to having a dialysis catheter placed. They asked whether or not we can go ahead and place a new access in the right arm to prepare for failure of the left arm access. He does not have adequate vein for a fistula. I did discuss with them potentially placing a graft.  However this is not going to do anything to prevent him from bleeding. My recommendation to them was to undergo plication with only a temporary Diatek. The patient wishes to think about this versus having a new graft placed and the old fistula ligated.   PLAN:  The patient will call if he wishes to have her procedure scheduled in the near future. I did discuss with the patient and his family today that he is at risk of recurrent bleeding and possible death if he hemorrhages significantly  Fabienne Bruns, MD [facility] [place] Office: [phone] Pager: [phone]

## 2014-08-04 NOTE — Transfer of Care (Signed)
Immediate Anesthesia Transfer of Care Note  Patient: Greg [name]  Procedure(s) Performed: Procedure(s): PLICATION OF  LEFT UPPER ARM ARTERIOVENOUS FISTULA (Left) INSERTION OF DIALYSIS CATHETER RIGHT INTERNAL JUGULAR VEIN (Right)  Patient Location: PACU  Anesthesia Type:General  Level of Consciousness: sedated and patient cooperative  Airway & Oxygen Therapy: Patient Spontanous Breathing and Patient connected to face mask oxygen  Post-op Assessment: Report given to RN and Post -op Vital signs reviewed and stable  Post vital signs: Reviewed and stable  Last Vitals:  Filed Vitals:   08/04/14 0614  BP: 185/83  Pulse: 78  Temp: 36.4 C  Resp: 16    Complications: No apparent anesthesia complications

## 2014-08-04 NOTE — Anesthesia Postprocedure Evaluation (Signed)
  Anesthesia Post-op Note  Patient: Greg [name]  Procedure(s) Performed: Procedure(s): PLICATION OF  LEFT UPPER ARM ARTERIOVENOUS FISTULA (Left) INSERTION OF DIALYSIS CATHETER RIGHT INTERNAL JUGULAR VEIN (Right)  Patient Location: PACU  Anesthesia Type:General  Level of Consciousness: awake, alert , oriented and patient cooperative  Airway and Oxygen Therapy: Patient Spontanous Breathing  Post-op Pain: mild, moderate  Post-op Assessment: Post-op Vital signs reviewed, Patient's Cardiovascular Status Stable, Respiratory Function Stable, Patent Airway, No signs of Nausea or vomiting and Pain level controlled              Post-op Vital Signs: stable  Last Vitals:  Filed Vitals:   08/04/14 1126  BP: 145/59  Pulse: 79  Temp:   Resp: 20    Complications: No apparent anesthesia complications

## 2014-08-04 NOTE — Anesthesia Procedure Notes (Signed)
Procedure Name: LMA Insertion Performed by: Renae Fickle Pre-anesthesia Checklist: Patient identified, Emergency Drugs available, Suction available and Patient being monitored Patient Re-evaluated:Patient Re-evaluated prior to inductionOxygen Delivery Method: Circle system utilized Preoxygenation: Pre-oxygenation with 100% oxygen Intubation Type: IV induction LMA: LMA inserted LMA Size: 4.0 Number of attempts: 1

## 2014-08-05 ENCOUNTER — Encounter (HOSPITAL_COMMUNITY): Payer: Self-pay | Admitting: Vascular Surgery

## 2014-08-20 ENCOUNTER — Ambulatory Visit: Payer: Medicare Other | Admitting: Internal Medicine

## 2014-08-23 ENCOUNTER — Inpatient Hospital Stay (HOSPITAL_COMMUNITY)
Admission: EM | Admit: 2014-08-23 | Discharge: 2014-08-31 | DRG: 280 | Disposition: A | Discharge status: Home Health | Payer: Medicare Other | Attending: Internal Medicine | Admitting: Internal Medicine

## 2014-08-23 ENCOUNTER — Inpatient Hospital Stay (HOSPITAL_COMMUNITY): Payer: Medicare Other

## 2014-08-23 ENCOUNTER — Emergency Department (HOSPITAL_COMMUNITY): Payer: Medicare Other

## 2014-08-23 ENCOUNTER — Encounter (HOSPITAL_COMMUNITY): Payer: Self-pay | Admitting: *Deleted

## 2014-08-23 ENCOUNTER — Encounter (HOSPITAL_COMMUNITY): Payer: Self-pay | Admitting: Emergency Medicine

## 2014-08-23 ENCOUNTER — Emergency Department (INDEPENDENT_AMBULATORY_CARE_PROVIDER_SITE_OTHER)
Admission: EM | Admit: 2014-08-23 | Discharge: 2014-08-23 | Disposition: A | Discharge status: Nursing Home (Includes ICF) | Payer: Medicare Other | Source: Home / Self Care | Attending: Family Medicine | Admitting: Family Medicine

## 2014-08-23 DIAGNOSIS — E118 Type 2 diabetes mellitus with unspecified complications: Secondary | ICD-10-CM

## 2014-08-23 DIAGNOSIS — L97509 Non-pressure chronic ulcer of other part of unspecified foot with unspecified severity: Secondary | ICD-10-CM | POA: Diagnosis present

## 2014-08-23 DIAGNOSIS — G92 Toxic encephalopathy: Secondary | ICD-10-CM | POA: Diagnosis present

## 2014-08-23 DIAGNOSIS — T80211A Bloodstream infection due to central venous catheter, initial encounter: Secondary | ICD-10-CM | POA: Diagnosis present

## 2014-08-23 DIAGNOSIS — Z7401 Bed confinement status: Secondary | ICD-10-CM

## 2014-08-23 DIAGNOSIS — I214 Non-ST elevation (NSTEMI) myocardial infarction: Secondary | ICD-10-CM | POA: Diagnosis present

## 2014-08-23 DIAGNOSIS — R1084 Generalized abdominal pain: Secondary | ICD-10-CM | POA: Diagnosis not present

## 2014-08-23 DIAGNOSIS — I2510 Atherosclerotic heart disease of native coronary artery without angina pectoris: Secondary | ICD-10-CM | POA: Diagnosis present

## 2014-08-23 DIAGNOSIS — Z794 Long term (current) use of insulin: Secondary | ICD-10-CM

## 2014-08-23 DIAGNOSIS — K2971 Gastritis, unspecified, with bleeding: Secondary | ICD-10-CM | POA: Diagnosis present

## 2014-08-23 DIAGNOSIS — K2210 Ulcer of esophagus without bleeding: Secondary | ICD-10-CM | POA: Diagnosis present

## 2014-08-23 DIAGNOSIS — B1920 Unspecified viral hepatitis C without hepatic coma: Secondary | ICD-10-CM | POA: Diagnosis present

## 2014-08-23 DIAGNOSIS — N186 End stage renal disease: Secondary | ICD-10-CM | POA: Diagnosis present

## 2014-08-23 DIAGNOSIS — Z7982 Long term (current) use of aspirin: Secondary | ICD-10-CM

## 2014-08-23 DIAGNOSIS — Z87891 Personal history of nicotine dependence: Secondary | ICD-10-CM | POA: Diagnosis not present

## 2014-08-23 DIAGNOSIS — E785 Hyperlipidemia, unspecified: Secondary | ICD-10-CM

## 2014-08-23 DIAGNOSIS — L089 Local infection of the skin and subcutaneous tissue, unspecified: Secondary | ICD-10-CM | POA: Diagnosis not present

## 2014-08-23 DIAGNOSIS — A4151 Sepsis due to Escherichia coli [E. coli]: Secondary | ICD-10-CM | POA: Diagnosis present

## 2014-08-23 DIAGNOSIS — K269 Duodenal ulcer, unspecified as acute or chronic, without hemorrhage or perforation: Secondary | ICD-10-CM | POA: Diagnosis present

## 2014-08-23 DIAGNOSIS — E11649 Type 2 diabetes mellitus with hypoglycemia without coma: Secondary | ICD-10-CM | POA: Diagnosis present

## 2014-08-23 DIAGNOSIS — I4891 Unspecified atrial fibrillation: Secondary | ICD-10-CM | POA: Diagnosis present

## 2014-08-23 DIAGNOSIS — D649 Anemia, unspecified: Secondary | ICD-10-CM | POA: Diagnosis present

## 2014-08-23 DIAGNOSIS — Z515 Encounter for palliative care: Secondary | ICD-10-CM | POA: Diagnosis not present

## 2014-08-23 DIAGNOSIS — K5900 Constipation, unspecified: Secondary | ICD-10-CM | POA: Diagnosis present

## 2014-08-23 DIAGNOSIS — R9431 Abnormal electrocardiogram [ECG] [EKG]: Secondary | ICD-10-CM | POA: Diagnosis not present

## 2014-08-23 DIAGNOSIS — N390 Urinary tract infection, site not specified: Secondary | ICD-10-CM | POA: Diagnosis present

## 2014-08-23 DIAGNOSIS — A419 Sepsis, unspecified organism: Secondary | ICD-10-CM | POA: Diagnosis not present

## 2014-08-23 DIAGNOSIS — E1142 Type 2 diabetes mellitus with diabetic polyneuropathy: Secondary | ICD-10-CM | POA: Diagnosis present

## 2014-08-23 DIAGNOSIS — F1010 Alcohol abuse, uncomplicated: Secondary | ICD-10-CM | POA: Diagnosis present

## 2014-08-23 DIAGNOSIS — D6959 Other secondary thrombocytopenia: Secondary | ICD-10-CM | POA: Diagnosis present

## 2014-08-23 DIAGNOSIS — R4182 Altered mental status, unspecified: Secondary | ICD-10-CM | POA: Diagnosis not present

## 2014-08-23 DIAGNOSIS — M869 Osteomyelitis, unspecified: Secondary | ICD-10-CM | POA: Diagnosis present

## 2014-08-23 DIAGNOSIS — E11621 Type 2 diabetes mellitus with foot ulcer: Secondary | ICD-10-CM | POA: Diagnosis present

## 2014-08-23 DIAGNOSIS — K567 Ileus, unspecified: Secondary | ICD-10-CM | POA: Diagnosis not present

## 2014-08-23 DIAGNOSIS — I4892 Unspecified atrial flutter: Secondary | ICD-10-CM | POA: Diagnosis present

## 2014-08-23 DIAGNOSIS — K7030 Alcoholic cirrhosis of liver without ascites: Secondary | ICD-10-CM | POA: Diagnosis not present

## 2014-08-23 DIAGNOSIS — Z992 Dependence on renal dialysis: Secondary | ICD-10-CM

## 2014-08-23 DIAGNOSIS — Z79899 Other long term (current) drug therapy: Secondary | ICD-10-CM | POA: Diagnosis not present

## 2014-08-23 DIAGNOSIS — I120 Hypertensive chronic kidney disease with stage 5 chronic kidney disease or end stage renal disease: Secondary | ICD-10-CM | POA: Diagnosis present

## 2014-08-23 DIAGNOSIS — R509 Fever, unspecified: Secondary | ICD-10-CM | POA: Diagnosis present

## 2014-08-23 DIAGNOSIS — Z95828 Presence of other vascular implants and grafts: Secondary | ICD-10-CM

## 2014-08-23 DIAGNOSIS — D638 Anemia in other chronic diseases classified elsewhere: Secondary | ICD-10-CM | POA: Diagnosis present

## 2014-08-23 DIAGNOSIS — R64 Cachexia: Secondary | ICD-10-CM | POA: Diagnosis present

## 2014-08-23 DIAGNOSIS — R578 Other shock: Secondary | ICD-10-CM | POA: Diagnosis present

## 2014-08-23 DIAGNOSIS — K254 Chronic or unspecified gastric ulcer with hemorrhage: Secondary | ICD-10-CM | POA: Diagnosis present

## 2014-08-23 DIAGNOSIS — K7460 Unspecified cirrhosis of liver: Secondary | ICD-10-CM | POA: Diagnosis present

## 2014-08-23 DIAGNOSIS — Y848 Other medical procedures as the cause of abnormal reaction of the patient, or of later complication, without mention of misadventure at the time of the procedure: Secondary | ICD-10-CM | POA: Diagnosis present

## 2014-08-23 DIAGNOSIS — Z9862 Peripheral vascular angioplasty status: Secondary | ICD-10-CM | POA: Diagnosis not present

## 2014-08-23 DIAGNOSIS — E872 Acidosis: Secondary | ICD-10-CM | POA: Diagnosis present

## 2014-08-23 DIAGNOSIS — B3781 Candidal esophagitis: Secondary | ICD-10-CM | POA: Diagnosis present

## 2014-08-23 DIAGNOSIS — K920 Hematemesis: Secondary | ICD-10-CM

## 2014-08-23 DIAGNOSIS — R001 Bradycardia, unspecified: Secondary | ICD-10-CM | POA: Diagnosis present

## 2014-08-23 DIAGNOSIS — R5381 Other malaise: Secondary | ICD-10-CM | POA: Diagnosis present

## 2014-08-23 DIAGNOSIS — Z419 Encounter for procedure for purposes other than remedying health state, unspecified: Secondary | ICD-10-CM

## 2014-08-23 DIAGNOSIS — R7989 Other specified abnormal findings of blood chemistry: Secondary | ICD-10-CM | POA: Diagnosis not present

## 2014-08-23 DIAGNOSIS — I10 Essential (primary) hypertension: Secondary | ICD-10-CM | POA: Diagnosis not present

## 2014-08-23 DIAGNOSIS — D696 Thrombocytopenia, unspecified: Secondary | ICD-10-CM | POA: Diagnosis present

## 2014-08-23 DIAGNOSIS — E1169 Type 2 diabetes mellitus with other specified complication: Secondary | ICD-10-CM | POA: Diagnosis not present

## 2014-08-23 DIAGNOSIS — I959 Hypotension, unspecified: Principal | ICD-10-CM

## 2014-08-23 DIAGNOSIS — Z6821 Body mass index (BMI) 21.0-21.9, adult: Secondary | ICD-10-CM

## 2014-08-23 DIAGNOSIS — E1122 Type 2 diabetes mellitus with diabetic chronic kidney disease: Secondary | ICD-10-CM | POA: Diagnosis present

## 2014-08-23 DIAGNOSIS — I9589 Other hypotension: Secondary | ICD-10-CM

## 2014-08-23 DIAGNOSIS — Z7901 Long term (current) use of anticoagulants: Secondary | ICD-10-CM

## 2014-08-23 DIAGNOSIS — K259 Gastric ulcer, unspecified as acute or chronic, without hemorrhage or perforation: Secondary | ICD-10-CM

## 2014-08-23 DIAGNOSIS — E11628 Type 2 diabetes mellitus with other skin complications: Secondary | ICD-10-CM | POA: Diagnosis present

## 2014-08-23 DIAGNOSIS — I69354 Hemiplegia and hemiparesis following cerebral infarction affecting left non-dominant side: Secondary | ICD-10-CM

## 2014-08-23 DIAGNOSIS — K449 Diaphragmatic hernia without obstruction or gangrene: Secondary | ICD-10-CM | POA: Diagnosis present

## 2014-08-23 DIAGNOSIS — E876 Hypokalemia: Secondary | ICD-10-CM | POA: Diagnosis present

## 2014-08-23 DIAGNOSIS — E1152 Type 2 diabetes mellitus with diabetic peripheral angiopathy with gangrene: Secondary | ICD-10-CM | POA: Diagnosis present

## 2014-08-23 DIAGNOSIS — E43 Unspecified severe protein-calorie malnutrition: Secondary | ICD-10-CM | POA: Diagnosis present

## 2014-08-23 DIAGNOSIS — R579 Shock, unspecified: Secondary | ICD-10-CM | POA: Diagnosis present

## 2014-08-23 DIAGNOSIS — N2581 Secondary hyperparathyroidism of renal origin: Secondary | ICD-10-CM | POA: Diagnosis present

## 2014-08-23 DIAGNOSIS — I96 Gangrene, not elsewhere classified: Secondary | ICD-10-CM | POA: Diagnosis present

## 2014-08-23 DIAGNOSIS — I252 Old myocardial infarction: Secondary | ICD-10-CM

## 2014-08-23 DIAGNOSIS — E162 Hypoglycemia, unspecified: Secondary | ICD-10-CM | POA: Diagnosis present

## 2014-08-23 DIAGNOSIS — E46 Unspecified protein-calorie malnutrition: Secondary | ICD-10-CM | POA: Diagnosis present

## 2014-08-23 DIAGNOSIS — R112 Nausea with vomiting, unspecified: Secondary | ICD-10-CM | POA: Diagnosis present

## 2014-08-23 HISTORY — DX: Pure hypercholesterolemia, unspecified: E78.00

## 2014-08-23 HISTORY — DX: Dependence on renal dialysis: Z99.2

## 2014-08-23 HISTORY — DX: Atherosclerotic heart disease of native coronary artery without angina pectoris: I25.10

## 2014-08-23 HISTORY — DX: End stage renal disease: N18.6

## 2014-08-23 LAB — COMPREHENSIVE METABOLIC PANEL
ALT: 10 U/L — AB (ref 17–63)
ANION GAP: 13 (ref 5–15)
AST: 42 U/L — AB (ref 15–41)
Albumin: 2.1 g/dL — ABNORMAL LOW (ref 3.5–5.0)
Alkaline Phosphatase: 168 U/L — ABNORMAL HIGH (ref 38–126)
BUN: 11 mg/dL (ref 6–20)
CO2: 28 mmol/L (ref 22–32)
CREATININE: 3.52 mg/dL — AB (ref 0.61–1.24)
Calcium: 8.6 mg/dL — ABNORMAL LOW (ref 8.9–10.3)
Chloride: 96 mmol/L — ABNORMAL LOW (ref 101–111)
GFR, EST AFRICAN AMERICAN: 19 mL/min — AB (ref >60)
GFR, EST NON AFRICAN AMERICAN: 17 mL/min — AB (ref >60)
GLUCOSE: 118 mg/dL — AB (ref 65–99)
POTASSIUM: 3.1 mmol/L — AB (ref 3.5–5.1)
SODIUM: 137 mmol/L (ref 135–145)
TOTAL PROTEIN: 6.2 g/dL — AB (ref 6.5–8.1)
Total Bilirubin: 0.8 mg/dL (ref 0.3–1.2)

## 2014-08-23 LAB — CBC WITH DIFFERENTIAL/PLATELET
Basophils Absolute: 0 10*3/uL (ref 0.0–0.1)
Basophils Relative: 0 % (ref 0–1)
EOS PCT: 0 % (ref 0–5)
Eosinophils Absolute: 0 10*3/uL (ref 0.0–0.7)
HCT: 28.3 % — ABNORMAL LOW (ref 39.0–52.0)
Hemoglobin: 8.9 g/dL — ABNORMAL LOW (ref 13.0–17.0)
LYMPHS ABS: 0.1 10*3/uL — AB (ref 0.7–4.0)
Lymphocytes Relative: 1 % — ABNORMAL LOW (ref 12–46)
MCH: 27.1 pg (ref 26.0–34.0)
MCHC: 31.4 g/dL (ref 30.0–36.0)
MCV: 86 fL (ref 78.0–100.0)
Monocytes Absolute: 0 10*3/uL — ABNORMAL LOW (ref 0.1–1.0)
Monocytes Relative: 1 % — ABNORMAL LOW (ref 3–12)
NEUTROS ABS: 5.8 10*3/uL (ref 1.7–7.7)
Neutrophils Relative %: 98 % — ABNORMAL HIGH (ref 43–77)
Platelets: 77 10*3/uL — ABNORMAL LOW (ref 150–400)
RBC: 3.29 MIL/uL — AB (ref 4.22–5.81)
RDW: 16.2 % — ABNORMAL HIGH (ref 11.5–15.5)
WBC: 6 10*3/uL (ref 4.0–10.5)

## 2014-08-23 LAB — I-STAT CG4 LACTIC ACID, ED
LACTIC ACID, VENOUS: 1.27 mmol/L (ref 0.5–2.0)
Lactic Acid, Venous: 4.88 mmol/L (ref 0.5–2.0)

## 2014-08-23 LAB — URINALYSIS, ROUTINE W REFLEX MICROSCOPIC
Bilirubin Urine: NEGATIVE
Glucose, UA: NEGATIVE mg/dL
KETONES UR: 15 mg/dL — AB
NITRITE: POSITIVE — AB
PH: 6.5 (ref 5.0–8.0)
UROBILINOGEN UA: 1 mg/dL (ref 0.0–1.0)

## 2014-08-23 LAB — URINE MICROSCOPIC-ADD ON

## 2014-08-23 LAB — TROPONIN I: TROPONIN I: 0.35 ng/mL — AB (ref <0.031)

## 2014-08-23 LAB — LACTIC ACID, PLASMA: LACTIC ACID, VENOUS: 3.6 mmol/L — AB (ref 0.5–2.0)

## 2014-08-23 LAB — MAGNESIUM: Magnesium: 1 mg/dL — ABNORMAL LOW (ref 1.7–2.4)

## 2014-08-23 MED ORDER — ASPIRIN 300 MG RE SUPP: 300.0000 mg | RECTAL | Status: DC

## 2014-08-23 MED ORDER — PHENYLEPHRINE HCL 10 MG/ML IJ SOLN
0.0000 ug/min | INTRAVENOUS | Status: DC
  Filled 2014-08-23: qty 1

## 2014-08-23 MED ORDER — DEXTROSE 5 % IV SOLN
2.0000 g | Freq: Once | INTRAVENOUS | Status: AC
  Administered 2014-08-23: 2 g via INTRAVENOUS
  Filled 2014-08-23: qty 2

## 2014-08-23 MED ORDER — SODIUM CHLORIDE 0.9 % IV BOLUS (SEPSIS)
700.0000 mL | Freq: Once | INTRAVENOUS | Status: AC
  Administered 2014-08-23: 700 mL via INTRAVENOUS

## 2014-08-23 MED ORDER — DEXTROSE 50 % IV SOLN
25.0000 g | Freq: Once | INTRAVENOUS | Status: AC
  Administered 2014-08-23: 25 g via INTRAVENOUS

## 2014-08-23 MED ORDER — ASPIRIN 325 MG PO TABS: 325.0000 mg | ORAL_TABLET | Freq: Once | ORAL | Status: DC

## 2014-08-23 MED ORDER — POTASSIUM CHLORIDE CRYS ER 20 MEQ PO TBCR: 40.0000 meq | EXTENDED_RELEASE_TABLET | Freq: Once | ORAL | Status: DC

## 2014-08-23 MED ORDER — ONDANSETRON HCL 4 MG/2ML IJ SOLN: 4.0000 mg | Freq: Four times a day (QID) | INTRAMUSCULAR | Status: DC | PRN

## 2014-08-23 MED ORDER — SODIUM CHLORIDE 0.9 % IV BOLUS (SEPSIS): 1000.0000 mL | Freq: Once | INTRAVENOUS | Status: DC

## 2014-08-23 MED ORDER — ASPIRIN 81 MG PO CHEW: 324.0000 mg | CHEWABLE_TABLET | ORAL | Status: DC

## 2014-08-23 MED ORDER — SODIUM CHLORIDE 0.9 % IV SOLN
250.0000 mL | INTRAVENOUS | Status: DC | PRN
  Administered 2014-08-27 – 2014-08-29 (×2): via INTRAVENOUS

## 2014-08-23 MED ORDER — HEPARIN SODIUM (PORCINE) 5000 UNIT/ML IJ SOLN
5000.0000 [IU] | Freq: Three times a day (TID) | INTRAMUSCULAR | Status: DC
Start: 2014-08-23 — End: 2014-08-24
  Administered 2014-08-24 (×2): 5000 [IU] via SUBCUTANEOUS
  Filled 2014-08-23 (×3): qty 1

## 2014-08-23 MED ORDER — SODIUM CHLORIDE 0.9 % IV SOLN
Freq: Once | INTRAVENOUS | Status: DC
  Filled 2014-08-23: qty 1000

## 2014-08-23 MED ORDER — ACETAMINOPHEN 325 MG PO TABS
650.0000 mg | ORAL_TABLET | Freq: Once | ORAL | Status: AC
  Administered 2014-08-23: 650 mg via ORAL

## 2014-08-23 MED ORDER — INSULIN ASPART 100 UNIT/ML ~~LOC~~ SOLN
0.0000 [IU] | SUBCUTANEOUS | Status: DC
  Administered 2014-08-24 (×2): 2 [IU] via SUBCUTANEOUS
  Administered 2014-08-24: 1 [IU] via SUBCUTANEOUS
  Administered 2014-08-24: 3 [IU] via SUBCUTANEOUS
  Administered 2014-08-25 (×4): 1 [IU] via SUBCUTANEOUS
  Administered 2014-08-30: 2 [IU] via SUBCUTANEOUS

## 2014-08-23 MED ORDER — PANTOPRAZOLE SODIUM 40 MG IV SOLR: 40.0000 mg | INTRAVENOUS | Status: DC

## 2014-08-23 MED ORDER — SODIUM CHLORIDE 0.9 % IV SOLN
INTRAVENOUS | Status: AC
  Administered 2014-08-23: 75 mL/h via INTRAVENOUS

## 2014-08-23 MED ORDER — NOREPINEPHRINE BITARTRATE 1 MG/ML IV SOLN
0.0000 ug/min | INTRAVENOUS | Status: DC
  Administered 2014-08-23: 5 ug/min via INTRAVENOUS
  Filled 2014-08-23: qty 4

## 2014-08-23 MED ORDER — SODIUM CHLORIDE 0.9 % IV BOLUS (SEPSIS)
500.0000 mL | Freq: Once | INTRAVENOUS | Status: AC
  Administered 2014-08-23: 500 mL via INTRAVENOUS

## 2014-08-23 MED ORDER — NITROGLYCERIN 2 % TD OINT: 0.5000 [in_us] | TOPICAL_OINTMENT | Freq: Four times a day (QID) | TRANSDERMAL | Status: DC

## 2014-08-23 MED ORDER — SODIUM CHLORIDE 0.9 % IV SOLN
Freq: Once | INTRAVENOUS | Status: AC
  Filled 2014-08-23: qty 1000

## 2014-08-23 MED ORDER — ACETAMINOPHEN 325 MG PO TABS
ORAL_TABLET | ORAL | Status: AC
  Filled 2014-08-23: qty 2

## 2014-08-23 MED ORDER — VANCOMYCIN HCL 10 G IV SOLR
1500.0000 mg | Freq: Once | INTRAVENOUS | Status: AC
  Administered 2014-08-23: 1500 mg via INTRAVENOUS
  Filled 2014-08-23: qty 1500

## 2014-08-23 NOTE — ED Notes (Signed)
ADmitting MD made aware of patient's dropping pressure and HR. Stated to give bolus.

## 2014-08-23 NOTE — ED Notes (Signed)
Patient moved to Trauma A per Critical Care.

## 2014-08-23 NOTE — ED Notes (Signed)
[facility] does not have a truck.  Notified [facility]

## 2014-08-23 NOTE — ED Notes (Signed)
MD made aware bolus is not being effective and dropping continues. IM called pharmacy and Critical Care. Crash Cart at the bedside.

## 2014-08-23 NOTE — ED Notes (Addendum)
Pt brought from urgent care.  Family brought pt from dialysis for fever.  Pt lethargic.  CBG 35.  Pt responding appropriately using [employer].

## 2014-08-23 NOTE — H&P (Signed)
[facility] Admission History and Physical       Greg [name] WUJ:[id number] DOB: 1945-03-15 DOA: 08/23/2014  Referring physician: EDP PCP: Rinaldo Cloud, MD  Specialists:   Chief Complaint: Fevers Chills, Nausea and Vomiting  HPI: Greg [name] is a 69 y.o. male with a history of ESRD on Dialysis (T, TH, Sat), DM2, HTN,  CAD S/P CAth Cirrhosis, and Osteomyelitis of the Left foot who began to have fevers and chills after dialysis today along with Nausea and Vomiting.   His temperature was rep[rted as 102, and he was taken to an area [facility] and from there he was sent to the ED.   A Sepsis workup was initiated, and he was placed on IV Vancomycin and Ceftazidime and referred for admission.    His family is at the bedside and give the history due to language barrier.     ADDENDUM:  Patient became Hypotensive and required Aggressive Fluid Resuscitation and Pressors were needed PCCM was consulted.       Review of Systems: Unable to Obtain from the Patient  Past Medical History  Diagnosis Date  . Renal disorder     HD patient  . Cirrhosis   . Alcohol abuse   . Stroke     left sided deficits  . Hypertension   . Diabetes mellitus     type 2   . Hepatitis     hep c  . Constipation      Past Surgical History  Procedure Laterality Date  . Eye surgery    . Av fistula placement  2006    L upper arm  . Peripheral vascular catheterization N/A 06/06/2014    Procedure: Abdominal Aortogram;  Surgeon: Sherren Kerns, MD;  Location: [facility] INVASIVE CV LAB;  Service: Cardiovascular;  Laterality: N/A;  . Lower extremity angiogram Bilateral 06/06/2014    Procedure: Lower Extremity Angiogram;  Surgeon: Sherren Kerns, MD;  Location: [facility] INVASIVE CV LAB;  Service: Cardiovascular;  Laterality: Bilateral;  . Peripheral vascular catheterization Left 06/06/2014    Procedure: Peripheral Vascular Intervention;  Surgeon: Sherren Kerns, MD;  Location: [facility] INVASIVE CV LAB;  Service: Cardiovascular;   Laterality: Left;  pta drug coated x3  . Revison of arteriovenous fistula Left 08/04/2014    Procedure: PLICATION OF  LEFT UPPER ARM ARTERIOVENOUS FISTULA;  Surgeon: Sherren Kerns, MD;  Location: [facility] OR;  Service: Vascular;  Laterality: Left;  . Insertion of dialysis catheter Right 08/04/2014    Procedure: INSERTION OF DIALYSIS CATHETER RIGHT INTERNAL JUGULAR VEIN;  Surgeon: Sherren Kerns, MD;  Location: [facility] OR;  Service: Vascular;  Laterality: Right;      Prior to Admission medications   Medication Sig Start Date End Date Taking? Authorizing Provider  aspirin 325 MG EC tablet Take 325 mg by mouth daily.   Yes Historical Provider, MD  atorvastatin (LIPITOR) 40 MG tablet Take 40 mg by mouth at bedtime.   Yes Historical Provider, MD  calcium acetate (PHOSLO) 667 MG capsule Take 1,334 mg by mouth 2 (two) times daily with a meal.  03/26/14  Yes Historical Provider, MD  carvedilol (COREG CR) 10 MG 24 hr capsule Take 10 mg by mouth daily. Takes on days he does not have dialysis TUES, THURS AND SAT   Yes Historical Provider, MD  clopidogrel (PLAVIX) 75 MG tablet Take 75 mg by mouth daily.   Yes Historical Provider, MD  docusate sodium (COLACE) 100 MG capsule Take 1 capsule (100 mg total) by mouth 2 (  two) times daily. 07/10/13  Yes Reuben Likes, MD  insulin NPH-insulin regular (HUMULIN 70/30) (70-30) 100 UNIT/ML injection Inject 10 Units into the skin daily.    Yes Historical Provider, MD  lactulose (CHRONULAC) 10 GM/15ML solution Take 30 mLs (20 g total) by mouth 3 (three) times daily. 07/10/13  Yes Reuben Likes, MD  oxyCODONE (ROXICODONE) 5 MG immediate release tablet Take 1 tablet (5 mg total) by mouth every 6 (six) hours as needed. 08/04/14  Yes Samantha J Rhyne, PA-C  sorbitol 70 % solution Take 30 mLs by mouth daily as needed. 07/17/14  Yes Historical Provider, MD  clindamycin (CLEOCIN) 150 MG capsule Take 3 capsules (450 mg total) by mouth 3 (three) times daily. 04/24/14   Charlestine Night, PA-C    gabapentin (NEURONTIN) 100 MG capsule Take 100 mg by mouth at bedtime as needed. FOR FEET PAIN 07/17/14   Historical Provider, MD     No Known Allergies  Social History:  reports that he quit smoking about 8 years ago. He has never used smokeless tobacco. He reports that he does not drink alcohol or use illicit drugs.    Family History  Problem Relation Age of Onset  . Diabetes Mother   . Hypertension Mother   . Diabetes Father   . Hypertension Father        Physical Exam:  GEN:  Pleasant Elderly ill Appearing  69 y.o. [place]  male examined and in no acute distress; cooperative with exam Filed Vitals:   08/23/14 2015 08/23/14 2045 08/23/14 2115 08/23/14 2130  BP: 107/51 105/47 118/51 120/55  Pulse: 90 85 89 88  Temp:      TempSrc:      Resp: 12  12   SpO2: 100% 99% 97% 98%   Blood pressure 120/55, pulse 88, temperature 102.8 F (39.3 C), temperature source Rectal, resp. rate 12, SpO2 98 %. PSYCH: He is alert and oriented x4; does not appear anxious does not appear depressed; affect is normal HEENT: Normocephalic and Atraumatic, Mucous membranes pink; PERRLA; EOM intact; Fundi:  Benign;  No scleral icterus, Nares: Patent, Oropharynx: Clear,    Neck:  FROM, No Cervical Lymphadenopathy nor Thyromegaly or Carotid Bruit; No JVD; Breasts:: Not examined CHEST WALL: No tenderness;    Temporary Dialysis Cath in Right Subclavian Present and Clamped CHEST: Normal respiration, clear to auscultation bilaterally HEART: Regular rate and rhythm; no murmurs rubs or gallops BACK: No kyphosis or scoliosis; No CVA tenderness ABDOMEN: Positive Bowel Sounds, Soft Non-Tender, No Rebound or Guarding; No Masses, No Organomegaly Rectal Exam: Not done EXTREMITIES: Left Arm AV Fistula Present;  No Cyanosis, Clubbing, or Edema; +Gangrenous and Dry Necrotic Left 5th Toe,   Genitalia: not examined PULSES: 2+ and symmetric SKIN: Normal hydration no rash or ulceration CNS:  Alert and Oriented x 4, No  Focal Deficits. Generalized Weakness Vascular: pulses palpable throughout    Labs on Admission:  Basic Metabolic Panel:  Recent Labs Lab 08/23/14 1934  NA 137  K 3.1*  CL 96*  CO2 28  GLUCOSE 118*  BUN 11  CREATININE 3.52*  CALCIUM 8.6*   Liver Function Tests:  Recent Labs Lab 08/23/14 1934  AST 42*  ALT 10*  ALKPHOS 168*  BILITOT 0.8  PROT 6.2*  ALBUMIN 2.1*   No results for input(s): LIPASE, AMYLASE in the last 168 hours. No results for input(s): AMMONIA in the last 168 hours. CBC:  Recent Labs Lab 08/23/14 1934  WBC 6.0  NEUTROABS 5.8  HGB 8.9*  HCT 28.3*  MCV 86.0  PLT 77*   Cardiac Enzymes:  Recent Labs Lab 08/23/14 1930  TROPONINI 0.35*    BNP (last 3 results) No results for input(s): BNP in the last 8760 hours.  ProBNP (last 3 results) No results for input(s): PROBNP in the last 8760 hours.  CBG: No results for input(s): GLUCAP in the last 168 hours.  Radiological Exams on Admission: Dg Chest Portable 1 View  08/23/2014   CLINICAL DATA:  Code sepsis.  No chest complaints.  EXAM: PORTABLE CHEST - 1 VIEW  COMPARISON:  One-view chest x-ray 08/04/2014.  FINDINGS: The heart is enlarged. A right IJ dialysis catheter is in place. Atherosclerotic calcifications are present at the aortic arch. Slight increased interstitial markings suggests mild edema. No focal airspace consolidation is evident.  IMPRESSION: 1. Cardiomegaly and slight increased interstitial pattern compatible with edema. 2. No focal airspace consolidation to suggest infection. 3. Right IJ Port-A-Cath is stable in position. 4. Atherosclerosis of the thoracic aorta.   Electronically Signed   By: Marin Roberts M.D.   On: 08/23/2014 19:35     EKG: Independently reviewed. Normal Sinus Rhythm,  Old Inferior Infarct Changes, and Ischemic Changes in Antero-Lateral Leads.       Assessment/Plan:   69 y.o. male with  Principal Problem:   1.     Sepsis/Gangrene of toe/Diabetic foot  infection/Osteomyelitis/UTI   Sepsis Workup   IV Vancomycin and Ceftazidime   Notify Vascular Dr. Darrick Penna in AM     Active Problems:   2.    Hypoglycemia- transient resolved with IV Dextrose Administration x1    Monitor Glucose Levels   Follow Hypoglycemic Protocol PRN    3.    Nausea and vomiting   PRN IV Zofran    4.    Essential hypertension   Continue Carvedilol As BP tolerates   Monitor BPs    5.    Coronary atherosclerosis- + Troponin = 0.35, Discussed with Cards on Call   Cardiac Monitoring   Repeat  Troponin and Cycle Troponins    Continue Carvedilol, ASA Plavix and Atorvastatin Rx    6.    Diabetes mellitus   Hold Insulin 70/30 dose   SSI coverage PRN   Check Hb A1C     7.    ESRD on hemodialysis- on Tues, Thurs, and Sats   Notify Dialysis/Renal in AM     8.    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    Anemia- Most likely due to Renal and Chronic Dz.   Hb = 8.9 ( previously 11.3)   Send Anemia Panel in AM   Send FOBT q AM x 3   10.    Thrombocytopenia- due to Cirrhosis, PLTs = 77    Monitor PLTs   11.    DVT Prophylaxis   SCDs   Code Status:     FULL CODE      Family Communication:   Wife and  2 Sons at Bedside       Disposition Plan:    Inpatient  Status        Time spent:  58 Minutes      Ron Parker [facility] Pager [phone]   If 7AM -7PM Please Contact the Day Rounding Team MD for [facility]  If 7PM-7AM, Please Contact Night-Floor Coverage  [email or web address] Password [email or web address] 08/23/2014, 9:48 PM     ADDENDUM:   Patient was seen and examined  on 08/23/2014

## 2014-08-23 NOTE — Procedures (Signed)
Central Venous Catheter Insertion Procedure Note Greg [name] [id number] 1945-07-21  Procedure: Insertion of Central Venous Catheter Indications: Assessment of intravascular volume, Drug and/or fluid administration and Frequent blood sampling  Procedure Details Consent: Risks of procedure as well as the alternatives and risks of each were explained to the (patient/caregiver).  Consent for procedure obtained. consent was obtained verbally from son at bedside while patient was clinically deteriorating Time Out: Verified patient identification, verified procedure, site/side was marked, verified correct patient position, special equipment/implants available, medications/allergies/relevent history reviewed, required imaging and test results available.  Performed  Maximum sterile technique was used including antiseptics, cap, gloves, gown, hand hygiene, mask and sheet. Skin prep: Chlorhexidine; local anesthetic administered A antimicrobial bonded/coated triple lumen catheter was placed in the left internal jugular vein using the Seldinger technique.  Evaluation Blood flow good Complications: No apparent complications Patient did tolerate procedure well. Chest X-ray ordered to verify placement.  CXR: normal.  Greg [name] 08/23/2014, 11:48 PM

## 2014-08-23 NOTE — ED Notes (Signed)
Xray at the bedside.

## 2014-08-23 NOTE — ED Notes (Signed)
Patient is weak, nausea, vomiting and fever.  Symptoms started today  Patient had dialysis today.  Patient answers appropriately.  Son says patient is behaving at baseline.

## 2014-08-23 NOTE — Progress Notes (Signed)
ANTIBIOTIC CONSULT NOTE - INITIAL  Pharmacy Consult for vancomycin and ceftazidim Indication: ostemyelitis  No Known Allergies  Patient Measurements:   Adjusted Body Weight:   Vital Signs: Temp: 102.8 F (39.3 C) (08/13 1907) Temp Source: Rectal (08/13 1907) BP: 111/52 mmHg (08/13 1907) Pulse Rate: 98 (08/13 1907) Intake/Output from previous day:   Intake/Output from this shift:    Labs: No results for input(s): WBC, HGB, PLT, LABCREA, CREATININE in the last 72 hours. CrCl cannot be calculated (Unknown ideal weight.). No results for input(s): VANCOTROUGH, VANCOPEAK, VANCORANDOM, GENTTROUGH, GENTPEAK, GENTRANDOM, TOBRATROUGH, TOBRAPEAK, TOBRARND, AMIKACINPEAK, AMIKACINTROU, AMIKACIN in the last 72 hours.   Microbiology: No results found for this or any previous visit (from the past 720 hour(s)).  Medical History: Past Medical History  Diagnosis Date  . Renal disorder     HD patient  . Cirrhosis   . Alcohol abuse   . Stroke     left sided deficits  . Hypertension   . Diabetes mellitus     type 2   . Hepatitis     hep c  . Constipation     Medications:  Scheduled:   Infusions:  . sodium chloride 0.9 % 1,000 mL infusion    . sodium chloride     Assessment: 69 yo male with osteomyelitis will be started on vancomycin and ceftazidime.  Patient has ESRD on HD usually TThSa.    Goal of Therapy:  Pre-HD vancomycin level 15-25   Plan:  - vancomycin 1500 iv x1, then f/u on HD schedule to redose vancomycin - ceftazidime 2g iv x1, then f/u on HD schedule to redose ceftazidime  Carnita Golob, Tsz-Yin 08/23/2014,7:38 PM

## 2014-08-23 NOTE — H&P (Signed)
PULMONARY / CRITICAL CARE MEDICINE   Name: Greg [name] MRN: [id number] DOB: Jul 27, 1945    ADMISSION DATE:  08/23/2014 CONSULTATION DATE:  08/23/14  REFERRING MD :  Dr Lovell Sheehan  CHIEF COMPLAINT:  Fever N/V  INITIAL PRESENTATION: ED, see significant events and HPI below.   SIGNIFICANT EVENTS: While being admitted by the hospitalist service, the patient acutely decompensated becoming increasingly lethargic and encephalopathic with bradycardia and hypotension. The staff and I rushed him from room A-9 to trauma A and immediately placed a central line and started him on pressors. Patient began to improve significantly once his blood pressure improved on NE. The max dose he required was 7.73mcg. On further assessment, it was noted that his blood sugar was <30 and he received D50.   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 He was at his usual state of health the afternoon of the 12th when he had a sudden onset of fever. He woke up the following morning feeling weaker but still was able to make it to dialysis. His dialysis session cut short because he was feeling quite ill and his family picked him up from the dialysis center. This information was provided by his son. While at home, he was very weak and ill appearing so his family took him to an urgent care center and was subsequently brought to the ED at [facility]. On other review of systems, the son stated that he denies any headaches, he did have some slight cough but no significant sputum production. He did have some N/V but no diarrhea. No dysuria was noted. Per the pts son, he does make some urine. He hasn't noted any changes in his foot. Patient was unable to provide any information 2/2 encephalopathy.   PAST MEDICAL HISTORY :   has a past medical history of  Renal disorder; Cirrhosis; Alcohol abuse; Stroke; Hypertension; Diabetes mellitus; Hepatitis; and Constipation.  has past surgical history that includes Eye surgery; AV fistula placement (2006); Cardiac catheterization (N/A, 06/06/2014); lower extremity angiogram (Bilateral, 06/06/2014); Cardiac catheterization (Left, 06/06/2014); Revison of arteriovenous fistula (Left, 08/04/2014); and Insertion of dialysis catheter (Right, 08/04/2014). Prior to Admission medications   Medication Sig Start Date End Date Taking? Authorizing Provider  aspirin 325 MG EC tablet Take 325 mg by mouth daily.   Yes Historical Provider, MD  atorvastatin (LIPITOR) 40 MG tablet Take 40 mg by mouth at bedtime.   Yes Historical Provider, MD  calcium acetate (PHOSLO) 667 MG capsule Take 1,334 mg by mouth 2 (two) times daily with a meal.  03/26/14  Yes Historical Provider, MD  carvedilol (COREG CR) 10 MG 24 hr capsule Take 10 mg by mouth as needed (for high HR). Takes on days he does not have dialysis TUES, THURS AND SAT   Yes Historical Provider, MD  clopidogrel (PLAVIX) 75 MG tablet Take 75 mg by mouth every other day.    Yes Historical Provider, MD  insulin NPH-insulin regular (HUMULIN 70/30) (70-30) 100 UNIT/ML injection Inject 10 Units into the skin daily.    Yes Historical Provider, MD  naproxen sodium (ANAPROX) 220 MG tablet Take 220 mg by mouth 2 (two) times daily as needed (for pain).   Yes Historical Provider, MD  sorbitol 70 % solution Take 30 mLs by mouth daily as needed (for laxative).  07/17/14  Yes Historical Provider, MD  clindamycin (CLEOCIN) 150 MG capsule Take 3 capsules (450 mg total) by mouth 3 (three) times daily. 04/24/14   Charlestine Night, PA-C  docusate sodium (COLACE) 100 MG capsule Take 1 capsule (100 mg total) by mouth 2 (two) times daily. 07/10/13   Reuben Likes, MD   No Known Allergies  FAMILY HISTORY:  indicated that his mother is alive. He indicated that his father is deceased.  SOCIAL HISTORY:   reports that he quit smoking about 8 years ago. He has never used smokeless tobacco. He reports that he does not drink alcohol or use illicit drugs.  REVIEW OF SYSTEMS:  Unable to obtain as the patient is encephalopathic  SUBJECTIVE: unable to get information   VITAL SIGNS: Temp:  [97.5 F (36.4 C)-102.9 F (39.4 C)] 97.5 F (36.4 C) (08/13 2219) Pulse Rate:  [45-119] 65 (08/13 2330) Resp:  [0-28] 0 (08/13 2330) BP: (77-135)/(43-87) 124/53 mmHg (08/13 2330) SpO2:  [92 %-100 %] 100 % (08/13 2330) HEMODYNAMICS:   VENTILATOR SETTINGS:   INTAKE / OUTPUT: No intake or output data in the 24 hours ending 08/23/14 2354  PHYSICAL EXAMINATION: General:  69 year old male who appears older than stated age who was in acute distress, minimally responsive, arousable by voice, malnourished well developed Neuro:  Cranial nerves II-XII grossly intact. PERRL. EOMI. Reflexes. Cerebellum. Gait was not assessed due to critical illness.  HEENT: Head, normocephalic, atraumatic. Ears symmetric, permeable. Eyes: no conjunctival icterus, no erythema. Nose: permeable, midline septum Clear oropharynx . Cardiovascular: S1S2 irregularly irregular, no murmurs, rubs, or gallops auscultated. No thrills palpated.  Lungs:  Chest symmetrical with respirations, clear to auscultation bilaterally with no wheezing, crackles, equal expansion.  Abdomen:  Soft, nontender, no guarding, nondistended. Present bowel sounds. Musculoskeletal:  Muscle atrophy noted. ROM appears to be intact. Gait was not assessed due to critical illness. No swelling nor tenderness of the joints.  Skin:  No notable scars, rashes, cruises. No bed sores noted. Patient has a right upper chest dialysis catheter. He also has what appears to be dry gangrene of his 5th toe on left foot. Lymphatics: no palpable lymphadenopathy of cervical, supraclvicular nor inguinal areas.    LABS:  CBC  Recent Labs Lab 08/23/14 1934  WBC 6.0  HGB 8.9*  HCT 28.3*   PLT 77*   Coag's No results for input(s): APTT, INR in the last 168 hours. BMET  Recent Labs Lab 08/23/14 1934  NA 137  K 3.1*  CL 96*  CO2 28  BUN 11  CREATININE 3.52*  GLUCOSE 118*   Electrolytes  Recent Labs Lab 08/23/14 1934 08/23/14 2227  CALCIUM 8.6*  --   MG  --  1.0*   Sepsis Markers  Recent Labs Lab 08/23/14 1952 08/23/14 2004 08/23/14 2226  LATICACIDVEN 4.88* 3.6* 1.27   ABG No results for input(s): PHART, PCO2ART, PO2ART in the last 168 hours. Liver Enzymes  Recent Labs Lab 08/23/14 1934  AST 42*  ALT 10*  ALKPHOS 168*  BILITOT 0.8  ALBUMIN 2.1*   Cardiac Enzymes  Recent Labs Lab 08/23/14 1930  TROPONINI 0.35*   Glucose No results for input(s): GLUCAP in the last 168 hours.  Imaging Dg Chest Portable 1 View  08/23/2014   CLINICAL DATA:  Code sepsis.  No chest complaints.  EXAM: PORTABLE CHEST - 1 VIEW  COMPARISON:  One-view chest x-ray 08/04/2014.  FINDINGS: The heart is enlarged. A right IJ dialysis catheter is in place. Atherosclerotic  calcifications are present at the aortic arch. Slight increased interstitial markings suggests mild edema. No focal airspace consolidation is evident.  IMPRESSION: 1. Cardiomegaly and slight increased interstitial pattern compatible with edema. 2. No focal airspace consolidation to suggest infection. 3. Right IJ Port-A-Cath is stable in position. 4. Atherosclerosis of the thoracic aorta.   Electronically Signed   By: Marin Roberts M.D.   On: 08/23/2014 19:35     ASSESSMENT / PLAN:  PULMONARY OETT A: no acute issues P:  Pt to be assessed closely as he may need an airway if encephalopathy worsens  CARDIOVASCULAR CVL central line placed 08/23/14 in the ED A:  Atrial fibrillation on monitor Hypertension on coreg on non-dialysis days Coronary artery disease, on ASA/plavix at home, do not suspect acute coronary syndrome at this time. We suspect the first troponin is secondary to demand +/-  underlying ESRD P: will recheck EKG. Will hold home antihypertensives. Will trend troponins x 3 to r/o ACS.  Check echocardiogram. Pt may need AC for atrial fibrillation but there is a history of bleeding episodes from his fistula site  RENAL A:  ESRD on HD T/Th/Sat - pt was unable to tolerate full HD session as he was increasingly weak.  P:  Remove current right upper chest catheter as this may be the source of infection  GASTROINTESTINAL A:  Cirrhosis due to Hepatitis C P:  Start diet when patient tolerates  HEMATOLOGIC A:  Anemia of chronic disease Thrombocytopenia P: workup for anemia ordered by hospitalist service.   INFECTIOUS A:  Sepsis shock w/lactic acidosis, hypotension, and need for pressors, likely related to right upper chest dialysis CVC vs. Urinary tract infection vs. Gangrene (appears to be dry gangrene) on his 5th digit on right foot. hx of osteomyelitis of the left foot P:  On levophed. IVF resuscitation  Remove line in the AM from right upper chest.  Abx to be dosed by pharmacy  BCx2 8/14 UC 8/14 in the ED Sputum n/a Abx: vancomycin and fortaz , start date 08/23/14  ENDOCRINE A:  DM2 on humulin at home w/symptomatic hypoglycemia, A1C 6.3 on 4/16 P:  d5 gtt @ 50, d50 boluses prn  NEUROLOGIC A:  Toxic metabolic encephalopathy 2/2 sepsis, improved s/p therapy P:  Monitor mental status. Patient's family able to communicate with him.  RASS goal: 0  OTHER  A: Hypomagnesemia  P: 1gm MgSo4 x 1 now   FAMILY  - Updates: son and rest of the family at bedside. Son speaks english and is very supportive. He understands how ill his father is and wants [place] to do our best to make him better. Pt is a full code.   - Inter-disciplinary family meet or Palliative Care meeting due by: day 7    Critical Care time spent managing shock and pressors 42 minutes.    Pulmonary and Critical Care Medicine [facility] Pager: [phone]  08/23/2014, 11:54 PM

## 2014-08-23 NOTE — ED Notes (Signed)
Critical Care at the bedside. Patient is still awake with stimulation as before. Critical Care to place central line.

## 2014-08-23 NOTE — Progress Notes (Signed)
   08/23/14 2300  Clinical Encounter Type  Visited With Patient  Visit Type Initial;Spiritual support;Social support;Critical Care;ED   Chaplain was paged to the ED at 11:07 PM to provide support for the patient's family. Patient's condition declined and patient required a rapid critical care response. Patient was quickly transferred from Pod A to Trauma A. When chaplain arrived, patient's family was in consultation B and the medical team was providing critical care for the patient. Chaplain introduced himself to the patient's family and asked them what they understood about what was happening. One of the patient's family members explained that they were rushed out of the room so care could be provided but that he understood the medical team had to do a procedure as a part of the patient's treatment. However, patient's family did not know much about the patient's current illness. Patient's family members were shocked and upset but seemed supportive of each other. Chaplain was able be present with patient's nurse while she updated the patient's family on the latest interventions and condition of the patient. Patient's family is now back at bedside. Page Merrilyn Puma chaplain if further support is needed later tonight.  Cranston Neighbor, Chaplain  11:44 PM

## 2014-08-23 NOTE — ED Notes (Signed)
Phlebotomy at the bedside  

## 2014-08-23 NOTE — ED Notes (Signed)
INternal [facility] at the bedside placing [employer].

## 2014-08-23 NOTE — ED Provider Notes (Signed)
CSN: [id number]     Arrival date & time 08/23/14  1739 History   First MD Initiated Contact with Patient 08/23/14 1808     No chief complaint on file.  (Consider location/radiation/quality/duration/timing/severity/associated sxs/prior Treatment) Patient is a 69 y.o. male presenting with vomiting. The history is provided by the patient, the spouse and a relative.  Emesis Severity:  Moderate Duration:  1 day Number of daily episodes:  2 Progression:  Worsening Chronicity:  New Context comment:  Fever since yest, vomiting today after dialysisi Associated symptoms: chills and fever   Associated symptoms: no abdominal pain and no diarrhea     Past Medical History  Diagnosis Date  . Renal disorder     HD patient  . Cirrhosis   . Alcohol abuse   . Stroke     left sided deficits  . Hypertension   . Diabetes mellitus     type 2   . Hepatitis     hep c  . Constipation    Past Surgical History  Procedure Laterality Date  . Eye surgery    . Av fistula placement  2006    L upper arm  . Peripheral vascular catheterization N/A 06/06/2014    Procedure: Abdominal Aortogram;  Surgeon: Sherren Kerns, MD;  Location: [facility] INVASIVE CV LAB;  Service: Cardiovascular;  Laterality: N/A;  . Lower extremity angiogram Bilateral 06/06/2014    Procedure: Lower Extremity Angiogram;  Surgeon: Sherren Kerns, MD;  Location: [facility] INVASIVE CV LAB;  Service: Cardiovascular;  Laterality: Bilateral;  . Peripheral vascular catheterization Left 06/06/2014    Procedure: Peripheral Vascular Intervention;  Surgeon: Sherren Kerns, MD;  Location: [facility] INVASIVE CV LAB;  Service: Cardiovascular;  Laterality: Left;  pta drug coated x3  . Revison of arteriovenous fistula Left 08/04/2014    Procedure: PLICATION OF  LEFT UPPER ARM ARTERIOVENOUS FISTULA;  Surgeon: Sherren Kerns, MD;  Location: [facility] OR;  Service: Vascular;  Laterality: Left;  . Insertion of dialysis catheter Right 08/04/2014    Procedure: INSERTION OF  DIALYSIS CATHETER RIGHT INTERNAL JUGULAR VEIN;  Surgeon: Sherren Kerns, MD;  Location: [facility] OR;  Service: Vascular;  Laterality: Right;   Family History  Problem Relation Age of Onset  . Diabetes Mother   . Hypertension Mother   . Diabetes Father   . Hypertension Father    Social History  Substance Use Topics  . Smoking status: Former Smoker    Quit date: 01/10/2006  . Smokeless tobacco: Never Used  . Alcohol Use: No     Comment: Former    Review of Systems  Constitutional: Positive for fever, chills and diaphoresis.  Gastrointestinal: Positive for nausea and vomiting. Negative for abdominal pain and diarrhea.    Allergies  Review of patient's allergies indicates no known allergies.  Home Medications   Prior to Admission medications   Medication Sig Start Date End Date Taking? Authorizing Provider  aspirin 325 MG EC tablet Take 325 mg by mouth daily.    Historical Provider, MD  atorvastatin (LIPITOR) 40 MG tablet Take 40 mg by mouth at bedtime.    Historical Provider, MD  calcium acetate (PHOSLO) 667 MG capsule Take 1,334 mg by mouth 2 (two) times daily with a meal.  03/26/14   Historical Provider, MD  carvedilol (COREG CR) 10 MG 24 hr capsule Take 10 mg by mouth daily. Takes on days he does not have dialysis TUES, THURS AND SAT    Historical Provider, MD  clindamycin (CLEOCIN) 150  MG capsule Take 3 capsules (450 mg total) by mouth 3 (three) times daily. 04/24/14   Charlestine Night, PA-C  clopidogrel (PLAVIX) 75 MG tablet Take 75 mg by mouth daily.    Historical Provider, MD  docusate sodium (COLACE) 100 MG capsule Take 1 capsule (100 mg total) by mouth 2 (two) times daily. 07/10/13   Reuben Likes, MD  gabapentin (NEURONTIN) 100 MG capsule Take 100 mg by mouth at bedtime as needed. FOR FEET PAIN 07/17/14   Historical Provider, MD  insulin NPH-insulin regular (HUMULIN 70/30) (70-30) 100 UNIT/ML injection Inject 10 Units into the skin daily.     Historical Provider, MD  lactulose  (CHRONULAC) 10 GM/15ML solution Take 30 mLs (20 g total) by mouth 3 (three) times daily. 07/10/13   Reuben Likes, MD  oxyCODONE (ROXICODONE) 5 MG immediate release tablet Take 1 tablet (5 mg total) by mouth every 6 (six) hours as needed. 08/04/14   Samantha J Rhyne, PA-C  sorbitol 70 % solution Take 30 mLs by mouth daily as needed. 07/17/14   Historical Provider, MD   BP 105/54 mmHg  Pulse 119  Temp(Src) 102.9 F (39.4 C) (Oral)  Resp 24  SpO2 100% Physical Exam  Constitutional: He appears lethargic. He appears cachectic. He has a sickly appearance. He appears ill. He appears distressed.    Abdominal: There is no tenderness.  Musculoskeletal: He exhibits no edema.  Neurological: He appears lethargic.  Skin: Skin is warm. He is diaphoretic.  Nursing note and vitals reviewed.   ED Course  Procedures (including critical care time) Labs Review Labs Reviewed - No data to display  Imaging Review No results found.   MDM   1. Sepsis associated hypotension   sent for sepsis eval.     Linna Hoff, MD 08/23/14 [phone]

## 2014-08-23 NOTE — ED Provider Notes (Signed)
CSN: [id number]     Arrival date & time 08/23/14  1859 History   First MD Initiated Contact with Patient 08/23/14 1901     Chief Complaint  Patient presents with  . Fever   HPI  Greg [name] is a 69yo male presenting today for fever.   Initially presented to Urgent Care earlier today complaining of vomiting x2 over course of today. Also notes fever since yesterday.  Appeared lethargic, distressed, and diaphoretic on exam. Sent to ED for sepsis evaluation.  On arrival to ED, appeared very lethargic and confused. Denies any pain. Denies nausea or vomiting today. States toe has been black for only a few days. No other family was initially present to aid with history. History conducted with aid of Urdu interpretor. Brought from Urgent Care from EMS.  Family arrived later to ED and a more thorough history was obtained. Greg [name] started experiencing fevers, chills, and fever yesterday. Vomited several times today. Went to dialysis today, but did not complete. Receives dialysis every Tuesday, Thursday, Saturday. Family was called from dialysis to pick him up since he appeared ill. Family took Greg [name] to Urgent Care for evaluation, but thought that he only had the flu. States he has not complained of dysuria or abdominal pain and did not seem to have increased frequency. Family states he looks better than he did initially when he was at Urgent Care.  Chart review also shows he was seen on 7/25 for AV fistula placement. 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 Per Infectious Disease office visit note, was referred to ID for osteomyelitis of right third toe, which he aquired during a month long trip to [place]. Has history of diabetes with peripheral neuropathy. Initially prescribed a 10day course of Clindamycin in April. Attends wound care clinic every Wednesday. Culture on 4/21 was  MSSA positive.  Necrotic wound of left fifth toe noted, wound of right fourth toe, and wound of right third toe. Xray was consistent with osteomyelitis. Was given Vancomycin on 05/15/14 and then on 6/7 to continued until 6/13. Initiated on  Cefazolin every Tuesday, Thursday, and Saturday with plan to continue for 6 week course starting on 6/13.  Past Medical History  Diagnosis Date  . Renal disorder     HD patient  . Cirrhosis   . Alcohol abuse   . Stroke     left sided deficits  . Hypertension   . Diabetes mellitus     type 2   . Hepatitis     hep c  . Constipation    Past Surgical History  Procedure Laterality Date  . Eye surgery    . Av fistula placement  2006    L upper arm  . Peripheral vascular catheterization N/A 06/06/2014    Procedure: Abdominal Aortogram;  Surgeon: Sherren Kerns, MD;  Location: [facility] INVASIVE CV LAB;  Service: Cardiovascular;  Laterality: N/A;  . Lower extremity angiogram Bilateral 06/06/2014    Procedure: Lower Extremity Angiogram;  Surgeon: Sherren Kerns, MD;  Location: [facility] INVASIVE CV LAB;  Service: Cardiovascular;  Laterality: Bilateral;  . Peripheral vascular catheterization Left 06/06/2014    Procedure: Peripheral Vascular Intervention;  Surgeon: Sherren Kerns, MD;  Location: [facility] INVASIVE CV LAB;  Service: Cardiovascular;  Laterality: Left;  pta drug coated x3  . Revison of arteriovenous fistula Left 08/04/2014    Procedure: PLICATION OF  LEFT  UPPER ARM ARTERIOVENOUS FISTULA;  Surgeon: Sherren Kerns, MD;  Location: [facility] OR;  Service: Vascular;  Laterality: Left;  . Insertion of dialysis catheter Right 08/04/2014    Procedure: INSERTION OF DIALYSIS CATHETER RIGHT INTERNAL JUGULAR VEIN;  Surgeon: Sherren Kerns, MD;  Location: [facility] OR;  Service: Vascular;  Laterality: Right;   Family History  Problem Relation Age of Onset  . Diabetes Mother   . Hypertension Mother   . Diabetes Father   . Hypertension Father    Social History  Substance Use Topics   . Smoking status: Former Smoker    Quit date: 01/10/2006  . Smokeless tobacco: Never Used  . Alcohol Use: No     Comment: Former    Review of Systems Denies pain, nausea, vomiting, however unreliable given AMS.   Allergies  Review of patient's allergies indicates no known allergies.  Home Medications   Prior to Admission medications   Medication Sig Start Date End Date Taking? Authorizing Provider  aspirin 325 MG EC tablet Take 325 mg by mouth daily.   Yes Historical Provider, MD  atorvastatin (LIPITOR) 40 MG tablet Take 40 mg by mouth at bedtime.   Yes Historical Provider, MD  calcium acetate (PHOSLO) 667 MG capsule Take 1,334 mg by mouth 2 (two) times daily with a meal.  03/26/14  Yes Historical Provider, MD  carvedilol (COREG CR) 10 MG 24 hr capsule Take 10 mg by mouth daily. Takes on days he does not have dialysis TUES, THURS AND SAT   Yes Historical Provider, MD  clopidogrel (PLAVIX) 75 MG tablet Take 75 mg by mouth daily.   Yes Historical Provider, MD  docusate sodium (COLACE) 100 MG capsule Take 1 capsule (100 mg total) by mouth 2 (two) times daily. 07/10/13  Yes Reuben Likes, MD  insulin NPH-insulin regular (HUMULIN 70/30) (70-30) 100 UNIT/ML injection Inject 10 Units into the skin daily.    Yes Historical Provider, MD  lactulose (CHRONULAC) 10 GM/15ML solution Take 30 mLs (20 g total) by mouth 3 (three) times daily. 07/10/13  Yes Reuben Likes, MD  oxyCODONE (ROXICODONE) 5 MG immediate release tablet Take 1 tablet (5 mg total) by mouth every 6 (six) hours as needed. 08/04/14  Yes Samantha J Rhyne, PA-C  sorbitol 70 % solution Take 30 mLs by mouth daily as needed. 07/17/14  Yes Historical Provider, MD  clindamycin (CLEOCIN) 150 MG capsule Take 3 capsules (450 mg total) by mouth 3 (three) times daily. 04/24/14   Charlestine Night, PA-C  gabapentin (NEURONTIN) 100 MG capsule Take 100 mg by mouth at bedtime as needed. FOR FEET PAIN 07/17/14   Historical Provider, MD   BP 120/55 mmHg   Pulse 88  Temp(Src) 102.8 F (39.3 C) (Rectal)  Resp 12  SpO2 98% Physical Exam  Constitutional: He appears well-developed and well-nourished. No distress.  HENT:  Dry mucous membranes, erythematous lesion noted on left tongue  Eyes: Pupils are equal, round, and reactive to light.  Cardiovascular: Normal rate and regular rhythm.  Exam reveals no gallop and no friction rub.   No murmur heard. Pulmonary/Chest: Effort normal. No respiratory distress. He has no wheezes.  Abdominal: Soft. Bowel sounds are normal. He exhibits no distension. There is no tenderness.  Musculoskeletal: He exhibits no edema.  Neurological:  Altered. Unable to state age. Confused about what brought him to ED. Brief staring episodes.   Skin:  5th digit on left dose with gangrene, cold to touch. Ulcer with scab noted on third  digit right foot. Multiple healed sores noted on lower extremity bilaterally.    ED Course  Procedures (including critical care time) Labs Review Labs Reviewed  COMPREHENSIVE METABOLIC PANEL - Abnormal; Notable for the following:    Potassium 3.1 (*)    Chloride 96 (*)    Glucose, Bld 118 (*)    Creatinine, Ser 3.52 (*)    Calcium 8.6 (*)    Total Protein 6.2 (*)    Albumin 2.1 (*)    AST 42 (*)    ALT 10 (*)    Alkaline Phosphatase 168 (*)    GFR calc non Af Amer 17 (*)    GFR calc Af Amer 19 (*)    All other components within normal limits  CBC WITH DIFFERENTIAL/PLATELET - Abnormal; Notable for the following:    RBC 3.29 (*)    Hemoglobin 8.9 (*)    HCT 28.3 (*)    RDW 16.2 (*)    Platelets 77 (*)    Neutrophils Relative % 98 (*)    Lymphocytes Relative 1 (*)    Lymphs Abs 0.1 (*)    Monocytes Relative 1 (*)    Monocytes Absolute 0.0 (*)    All other components within normal limits  LACTIC ACID, PLASMA - Abnormal; Notable for the following:    Lactic Acid, Venous 3.6 (*)    All other components within normal limits  I-STAT CG4 LACTIC ACID, ED - Abnormal; Notable for the  following:    Lactic Acid, Venous 4.88 (*)    All other components within normal limits  CULTURE, BLOOD (ROUTINE X 2)  CULTURE, BLOOD (ROUTINE X 2)  URINE CULTURE  URINALYSIS, ROUTINE W REFLEX MICROSCOPIC (NOT AT [facility])  TROPONIN I  LACTIC ACID, PLASMA    Imaging Review Dg Chest Portable 1 View  08/23/2014   CLINICAL DATA:  Code sepsis.  No chest complaints.  EXAM: PORTABLE CHEST - 1 VIEW  COMPARISON:  One-view chest x-ray 08/04/2014.  FINDINGS: The heart is enlarged. A right IJ dialysis catheter is in place. Atherosclerotic calcifications are present at the aortic arch. Slight increased interstitial markings suggests mild edema. No focal airspace consolidation is evident.  IMPRESSION: 1. Cardiomegaly and slight increased interstitial pattern compatible with edema. 2. No focal airspace consolidation to suggest infection. 3. Right IJ Port-A-Cath is stable in position. 4. Atherosclerosis of the thoracic aorta.   Electronically Signed   By: Marin Roberts M.D.   On: 08/23/2014 19:35   I, Garry Heater, personally reviewed and evaluated these images and lab results as part of my medical decision-making.   EKG Interpretation   Date/Time:  Saturday August 23 2014 19:01:19 EDT Ventricular Rate:  99 PR Interval:  207 QRS Duration: 106 QT Interval:  358 QTC Calculation: 459 R Axis:   -17 Text Interpretation:  Sinus rhythm Inferior infarct, old Probable  posterior infarct, recent Abnrm T, consider ischemia, anterolateral lds  Abnormal ekg Confirmed by BEATON  MD, ROBERT ([id number]) on 08/23/2014 7:08:59  PM      MDM   Final diagnoses:  None  Will initiate sepsis protocol. Obtain CBC, CMP, lactic acid, troponin, urinalysis. Will obtain blood cx x2 and urine cx. EEG given possible bite on tongue, confusion, and staring episodes. EKG with concern for probable posterior infarct and new T wave changes in anterolateral leads. Will obtain troponin. Due to CKD, given 700cc bolus of NS. Initiate  on NS@75cc /hr. Vanc and Ceftaz for sepsis.  Lactic Acid elevated at 4.88, improved to 3.6  following fluids. CMP with hypokalemia 3.1, Creatinine 3.52. CBC with anemia of hemoglobin 8.9, large drop from 11.2 on 08/04/14. Troponin pending. Urine obtained for cultures and was very thick and white.  Hospitalist contacted. Admit to SDU for treatment of sepsis. Three possible sources noted: Osteomyelitis (has been receiving treatment since April, followed by ID), new dialysis catheter placement (does not appear infected), and urine (appeared very cloudy, thick, and white)   Araceli Bouche, DO 08/23/14 2133  Nelva Nay, MD 08/23/14 2217

## 2014-08-24 ENCOUNTER — Encounter (HOSPITAL_COMMUNITY): Payer: Self-pay | Admitting: Internal Medicine

## 2014-08-24 DIAGNOSIS — I10 Essential (primary) hypertension: Secondary | ICD-10-CM

## 2014-08-24 DIAGNOSIS — E785 Hyperlipidemia, unspecified: Secondary | ICD-10-CM

## 2014-08-24 DIAGNOSIS — R9431 Abnormal electrocardiogram [ECG] [EKG]: Secondary | ICD-10-CM

## 2014-08-24 DIAGNOSIS — R7989 Other specified abnormal findings of blood chemistry: Secondary | ICD-10-CM

## 2014-08-24 DIAGNOSIS — I9589 Other hypotension: Secondary | ICD-10-CM | POA: Insufficient documentation

## 2014-08-24 DIAGNOSIS — Z992 Dependence on renal dialysis: Secondary | ICD-10-CM

## 2014-08-24 DIAGNOSIS — L089 Local infection of the skin and subcutaneous tissue, unspecified: Secondary | ICD-10-CM

## 2014-08-24 DIAGNOSIS — R6521 Severe sepsis with septic shock: Secondary | ICD-10-CM

## 2014-08-24 DIAGNOSIS — A419 Sepsis, unspecified organism: Secondary | ICD-10-CM

## 2014-08-24 DIAGNOSIS — E118 Type 2 diabetes mellitus with unspecified complications: Secondary | ICD-10-CM | POA: Insufficient documentation

## 2014-08-24 DIAGNOSIS — I2510 Atherosclerotic heart disease of native coronary artery without angina pectoris: Secondary | ICD-10-CM

## 2014-08-24 DIAGNOSIS — E1169 Type 2 diabetes mellitus with other specified complication: Secondary | ICD-10-CM

## 2014-08-24 DIAGNOSIS — N186 End stage renal disease: Secondary | ICD-10-CM

## 2014-08-24 LAB — BASIC METABOLIC PANEL
Anion gap: 10 (ref 5–15)
BUN: 14 mg/dL (ref 6–20)
CHLORIDE: 99 mmol/L — AB (ref 101–111)
CO2: 27 mmol/L (ref 22–32)
CREATININE: 3.56 mg/dL — AB (ref 0.61–1.24)
Calcium: 8.3 mg/dL — ABNORMAL LOW (ref 8.9–10.3)
GFR, EST AFRICAN AMERICAN: 19 mL/min — AB (ref >60)
GFR, EST NON AFRICAN AMERICAN: 16 mL/min — AB (ref >60)
GLUCOSE: 169 mg/dL — AB (ref 65–99)
POTASSIUM: 3.7 mmol/L (ref 3.5–5.1)
Sodium: 136 mmol/L (ref 135–145)

## 2014-08-24 LAB — RETICULOCYTES
RBC.: 3.21 MIL/uL — ABNORMAL LOW (ref 4.22–5.81)
RETIC COUNT ABSOLUTE: 54.6 10*3/uL (ref 19.0–186.0)
Retic Ct Pct: 1.7 % (ref 0.4–3.1)

## 2014-08-24 LAB — I-STAT ARTERIAL BLOOD GAS, ED
Acid-Base Excess: 2 mmol/L (ref 0.0–2.0)
BICARBONATE: 26.7 meq/L — AB (ref 20.0–24.0)
O2 Saturation: 97 %
PCO2 ART: 38.7 mmHg (ref 35.0–45.0)
PH ART: 7.446 (ref 7.350–7.450)
PO2 ART: 92 mmHg (ref 80.0–100.0)
Patient temperature: 98.6
TCO2: 28 mmol/L (ref 0–100)

## 2014-08-24 LAB — CBC
HCT: 27.8 % — ABNORMAL LOW (ref 39.0–52.0)
HEMOGLOBIN: 9 g/dL — AB (ref 13.0–17.0)
MCH: 28 pg (ref 26.0–34.0)
MCHC: 32.4 g/dL (ref 30.0–36.0)
MCV: 86.6 fL (ref 78.0–100.0)
PLATELETS: 68 10*3/uL — AB (ref 150–400)
RBC: 3.21 MIL/uL — AB (ref 4.22–5.81)
RDW: 16.5 % — AB (ref 11.5–15.5)
WBC: 16 10*3/uL — AB (ref 4.0–10.5)

## 2014-08-24 LAB — I-STAT CG4 LACTIC ACID, ED: Lactic Acid, Venous: 2.5 mmol/L (ref 0.5–2.0)

## 2014-08-24 LAB — CBG MONITORING, ED: GLUCOSE-CAPILLARY: 27 mg/dL — AB (ref 65–99)

## 2014-08-24 LAB — FOLATE: FOLATE: 3.7 ng/mL — AB (ref >5.9)

## 2014-08-24 LAB — GLUCOSE, CAPILLARY
GLUCOSE-CAPILLARY: 147 mg/dL — AB (ref 65–99)
GLUCOSE-CAPILLARY: 215 mg/dL — AB (ref 65–99)
GLUCOSE-CAPILLARY: 130 mg/dL — AB (ref 65–99)
Glucose-Capillary: 166 mg/dL — ABNORMAL HIGH (ref 65–99)
Glucose-Capillary: 164 mg/dL — ABNORMAL HIGH (ref 65–99)

## 2014-08-24 LAB — MAGNESIUM: MAGNESIUM: 2.1 mg/dL (ref 1.7–2.4)

## 2014-08-24 LAB — FERRITIN: Ferritin: 259 ng/mL (ref 24–336)

## 2014-08-24 LAB — MRSA PCR SCREENING: MRSA by PCR: NEGATIVE

## 2014-08-24 LAB — IRON AND TIBC
Iron: 7 ug/dL — ABNORMAL LOW (ref 45–182)
Saturation Ratios: 4 % — ABNORMAL LOW (ref 17.9–39.5)
TIBC: 175 ug/dL — AB (ref 250–450)
UIBC: 168 ug/dL

## 2014-08-24 LAB — TROPONIN I
TROPONIN I: 0.32 ng/mL — AB (ref <0.031)
TROPONIN I: 2.56 ng/mL — AB (ref <0.031)
Troponin I: 1.13 ng/mL (ref <0.031)

## 2014-08-24 LAB — LACTIC ACID, PLASMA
LACTIC ACID, VENOUS: 3.7 mmol/L — AB (ref 0.5–2.0)
LACTIC ACID, VENOUS: 2 mmol/L (ref 0.5–2.0)

## 2014-08-24 LAB — PROCALCITONIN: Procalcitonin: 50.87 ng/mL

## 2014-08-24 LAB — PHOSPHORUS: Phosphorus: 3.2 mg/dL (ref 2.5–4.6)

## 2014-08-24 LAB — VITAMIN B12: VITAMIN B 12: 1127 pg/mL — AB (ref 180–914)

## 2014-08-24 MED ORDER — DEXTROSE 5 % IV SOLN: 2.0000 g | INTRAVENOUS | Status: DC

## 2014-08-24 MED ORDER — ALUM & MAG HYDROXIDE-SIMETH 200-200-20 MG/5ML PO SUSP
30.0000 mL | Freq: Four times a day (QID) | ORAL | Status: DC | PRN
  Filled 2014-08-24: qty 30

## 2014-08-24 MED ORDER — HYDROMORPHONE HCL 1 MG/ML IJ SOLN: 0.5000 mg | INTRAMUSCULAR | Status: DC | PRN

## 2014-08-24 MED ORDER — HEPARIN (PORCINE) IN NACL 100-0.45 UNIT/ML-% IJ SOLN
900.0000 [IU]/h | INTRAMUSCULAR | Status: DC
Start: 2014-08-24 — End: 2014-08-25
  Administered 2014-08-24: 900 [IU]/h via INTRAVENOUS
  Filled 2014-08-24 (×2): qty 250

## 2014-08-24 MED ORDER — SODIUM CHLORIDE 0.9 % IV SOLN: INTRAVENOUS | Status: DC

## 2014-08-24 MED ORDER — OXYCODONE HCL 5 MG PO TABS: 5.0000 mg | ORAL_TABLET | ORAL | Status: DC | PRN

## 2014-08-24 MED ORDER — ONDANSETRON HCL 4 MG/2ML IJ SOLN
4.0000 mg | Freq: Four times a day (QID) | INTRAMUSCULAR | Status: DC | PRN
  Administered 2014-08-24 – 2014-08-25 (×4): 4 mg via INTRAVENOUS
  Filled 2014-08-24 (×4): qty 2

## 2014-08-24 MED ORDER — CARVEDILOL PHOSPHATE ER 10 MG PO CP24
10.0000 mg | ORAL_CAPSULE | ORAL | Status: DC | PRN
  Filled 2014-08-24: qty 1

## 2014-08-24 MED ORDER — ACETAMINOPHEN 650 MG RE SUPP: 650.0000 mg | Freq: Four times a day (QID) | RECTAL | Status: DC | PRN

## 2014-08-24 MED ORDER — VANCOMYCIN HCL IN DEXTROSE 750-5 MG/150ML-% IV SOLN: 750.0000 mg | INTRAVENOUS | Status: DC

## 2014-08-24 MED ORDER — ASPIRIN EC 81 MG PO TBEC
81.0000 mg | DELAYED_RELEASE_TABLET | Freq: Every day | ORAL | Status: DC
  Administered 2014-08-25: 81 mg via ORAL
  Filled 2014-08-24: qty 1

## 2014-08-24 MED ORDER — AMIODARONE HCL IN DEXTROSE 360-4.14 MG/200ML-% IV SOLN
60.0000 mg/h | INTRAVENOUS | Status: AC
  Administered 2014-08-24 (×2): 60 mg/h via INTRAVENOUS
  Filled 2014-08-24 (×2): qty 200

## 2014-08-24 MED ORDER — AMIODARONE LOAD VIA INFUSION
150.0000 mg | Freq: Once | INTRAVENOUS | Status: AC
  Administered 2014-08-24: 150 mg via INTRAVENOUS
  Filled 2014-08-24: qty 83.34

## 2014-08-24 MED ORDER — CLOPIDOGREL BISULFATE 75 MG PO TABS
75.0000 mg | ORAL_TABLET | ORAL | Status: DC
  Administered 2014-08-24 – 2014-08-26 (×2): 75 mg via ORAL
  Filled 2014-08-24 (×3): qty 1

## 2014-08-24 MED ORDER — CALCIUM ACETATE (PHOS BINDER) 667 MG PO CAPS
1334.0000 mg | ORAL_CAPSULE | Freq: Two times a day (BID) | ORAL | Status: DC
  Administered 2014-08-24 (×2): 1334 mg via ORAL
  Administered 2014-08-25: 667 mg via ORAL
  Filled 2014-08-24 (×7): qty 2

## 2014-08-24 MED ORDER — SODIUM CHLORIDE 0.9 % IV BOLUS (SEPSIS): 1000.0000 mL | Freq: Once | INTRAVENOUS | Status: DC

## 2014-08-24 MED ORDER — AMIODARONE HCL IN DEXTROSE 360-4.14 MG/200ML-% IV SOLN
15.0000 mg/h | INTRAVENOUS | Status: DC
  Filled 2014-08-24: qty 200

## 2014-08-24 MED ORDER — ATORVASTATIN CALCIUM 40 MG PO TABS
40.0000 mg | ORAL_TABLET | Freq: Every day | ORAL | Status: DC
  Administered 2014-08-26 – 2014-08-30 (×3): 40 mg via ORAL
  Filled 2014-08-24 (×8): qty 1

## 2014-08-24 MED ORDER — ASPIRIN EC 325 MG PO TBEC
325.0000 mg | DELAYED_RELEASE_TABLET | Freq: Every day | ORAL | Status: DC
  Administered 2014-08-24: 325 mg via ORAL
  Filled 2014-08-24: qty 1

## 2014-08-24 MED ORDER — ACETAMINOPHEN 325 MG PO TABS: 650.0000 mg | ORAL_TABLET | Freq: Four times a day (QID) | ORAL | Status: DC | PRN

## 2014-08-24 MED ORDER — DEXTROSE 5 % IV SOLN
INTRAVENOUS | Status: DC
  Administered 2014-08-24: via INTRAVENOUS

## 2014-08-24 MED ORDER — MAGNESIUM SULFATE IN D5W 10-5 MG/ML-% IV SOLN
1.0000 g | Freq: Once | INTRAVENOUS | Status: AC
  Administered 2014-08-24: 1 g via INTRAVENOUS
  Filled 2014-08-24: qty 100

## 2014-08-24 MED ORDER — MAGNESIUM SULFATE 2 GM/50ML IV SOLN
2.0000 g | Freq: Once | INTRAVENOUS | Status: AC
  Administered 2014-08-24: 2 g via INTRAVENOUS
  Filled 2014-08-24: qty 50

## 2014-08-24 MED ORDER — DEXTROSE 50 % IV SOLN
50.0000 mL | Freq: Once | INTRAVENOUS | Status: AC
  Administered 2014-08-24: 50 mL via INTRAVENOUS

## 2014-08-24 MED ORDER — ONDANSETRON HCL 4 MG PO TABS: 4.0000 mg | ORAL_TABLET | Freq: Four times a day (QID) | ORAL | Status: DC | PRN

## 2014-08-24 NOTE — ED Notes (Signed)
Patient is responsive to voice and touch upon assessment. Patient does not have family at the bedside at this time. Skin is warm, dry, and intact upon arrival. Vital Signs WNL. Patient has surgical site noted to the left upper arm with no purulent drainage noted and site is clean, dry, and intact. Patient has Dialysis Catheter in the right upper chest. Reports being Tuesday, Thursday, Saturday Dialysis patient.

## 2014-08-24 NOTE — Progress Notes (Signed)
Paged on call cardiology MD. Pt started on IV amio today. Pts HR now in the 50's and will occasionally drip to mid 40's. Pt A/O x4. Per Dr Sharyn Lull to change dose to 15mg /hr. Will continue to monitor

## 2014-08-24 NOTE — ED Notes (Signed)
Family arrives at the patient's bedside. Son stated, Patient had dialysis today but was unable to finish because he was not feeling well. Patient complete half a treatment. After going home, patient started to have vomiting and family noted a fever. Patient was taken to urgent care where they transferred him to [facility].

## 2014-08-24 NOTE — Consult Note (Signed)
Reason for Consult: Elevated cardiac enzymes Referring Physician: CCM  Rorik Vespa is an 69 y.o. male.  HPI: Patient is 69 year old male with past medical history significant for multiple medical problems i.e. 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 Cardiologic consultation is called as patient was noted to have progressive increasing troponin I. Patient denies any chest pain but complains of feeling weak tired and no energy.. Patient denies any palpitation lightheadedness or syncope. Denies PND orthopnea leg swelling. Patient also was noted to be in A. fib flutter which is new as compared to EKG which was done earlier yesterday.  Past Medical History  Diagnosis Date  . Renal disorder     HD patient  . Cirrhosis   . Alcohol abuse   . Stroke     left sided deficits  . Hypertension   . Diabetes mellitus     type 2   . Hepatitis     hep c  . Constipation   . Coronary artery disease     Past Surgical History  Procedure Laterality Date  . Eye surgery    . Av fistula placement  2006    L upper arm  . Peripheral vascular catheterization N/A 06/06/2014    Procedure: Abdominal Aortogram;  Surgeon: Elam Dutch, MD;  Location: [facility];  Service: Cardiovascular;  Laterality: N/A;  . Lower extremity angiogram Bilateral 06/06/2014    Procedure: Lower Extremity Angiogram;  Surgeon: Elam Dutch, MD;  Location: [facility] [facility];  Service: Cardiovascular;  Laterality: Bilateral;  . Peripheral vascular catheterization Left 06/06/2014    Procedure: Peripheral Vascular Intervention;  Surgeon: Elam Dutch, MD;  Location: [facility];  Service: Cardiovascular;  Laterality: Left;  pta drug coated x3  . Revison of arteriovenous fistula Left 6/80/3212    Procedure: PLICATION OF  LEFT UPPER ARM ARTERIOVENOUS FISTULA;  Surgeon: Elam Dutch, MD;  Location: [place];  Service: Vascular;  Laterality: Left;  . Insertion of dialysis catheter Right 08/04/2014    Procedure: INSERTION OF DIALYSIS CATHETER RIGHT INTERNAL JUGULAR VEIN;  Surgeon: Elam Dutch, MD;  Location: [place];  Service: Vascular;  Laterality: Right;    Family History  Problem Relation Age of Onset  . Diabetes Mother   . Hypertension Mother   . Diabetes Father   . Hypertension Father     Social History:  reports that he quit smoking about 8 years ago. He has never used smokeless tobacco. He reports that he does not drink alcohol or use illicit drugs.  Allergies: No Known Allergies  Medications: I have reviewed the patient's current medications.  Results for orders placed or performed during the hospital encounter of 08/23/14 (from the past 48 hour(s))  Troponin I     Status: Abnormal   Collection Time: 08/23/14  7:30 PM  Result Value Ref Range   Troponin I 0.35 (H) <0.031 ng/mL    Comment:        PERSISTENTLY INCREASED TROPONIN VALUES IN THE RANGE OF 0.04-0.49 ng/mL CAN BE SEEN IN:       -UNSTABLE ANGINA       -CONGESTIVE HEART FAILURE       -MYOCARDITIS       -CHEST TRAUMA       -ARRYHTHMIAS       -LATE PRESENTING MYOCARDIAL INFARCTION       -COPD   CLINICAL FOLLOW-UP RECOMMENDED.   Comprehensive metabolic panel      Status: Abnormal   Collection Time: 08/23/14  7:34 PM  Result Value Ref Range   Sodium 137 135 - 145 mmol/L   Potassium 3.1 (L) 3.5 - 5.1 mmol/L   Chloride 96 (L) 101 - 111 mmol/L   CO2 28 22 - 32 mmol/L   Glucose, Bld 118 (H) 65 - 99 mg/dL   BUN 11 6 - 20 mg/dL   Creatinine, Ser 3.52 (H) 0.61 - 1.24 mg/dL   Calcium 8.6 (L) 8.9 - 10.3 mg/dL   Total Protein 6.2 (L) 6.5 - 8.1 g/dL   Albumin 2.1 (L) 3.5 - 5.0 g/dL   AST 42 (H) 15 - 41 U/L   ALT 10 (L) 17 - 63 U/L   Alkaline Phosphatase 168 (H) 38 - 126 U/L   Total Bilirubin 0.8 0.3 - 1.2 mg/dL   GFR calc non Af Amer 17 (L) >60 mL/min   GFR calc Af Amer 19 (L) >60 mL/min    Comment: (NOTE) The eGFR has been calculated using the CKD EPI equation. This calculation has not been validated in all clinical situations. eGFR's persistently <60 mL/min signify possible Chronic Kidney Disease.    Anion gap 13 5 - 15  CBC with Differential     Status: Abnormal   Collection Time: 08/23/14  7:34 PM  Result Value Ref Range   WBC 6.0 4.0 - 10.5 K/uL   RBC 3.29 (L) 4.22 - 5.81 MIL/uL   Hemoglobin 8.9 (L) 13.0 - 17.0 g/dL   HCT 28.3 (L) 39.0 - 52.0 %   MCV 86.0 78.0 - 100.0 fL   MCH 27.1 26.0 - 34.0 pg   MCHC 31.4 30.0 - 36.0 g/dL   RDW 16.2 (H) 11.5 - 15.5 %   Platelets 77 (L) 150 - 400 K/uL    Comment: PLATELET COUNT CONFIRMED BY SMEAR   Neutrophils Relative % 98 (H) 43 - 77 %   Neutro Abs 5.8 1.7 - 7.7 K/uL   Lymphocytes Relative 1 (L) 12 - 46 %   Lymphs Abs 0.1 (L) 0.7 - 4.0 K/uL   Monocytes Relative 1 (L) 3 - 12 %   Monocytes Absolute 0.0 (L) 0.1 - 1.0 K/uL   Eosinophils Relative 0 0 - 5 %   Eosinophils Absolute 0.0 0.0 - 0.7 K/uL   Basophils Relative 0 0 - 1 %   Basophils Absolute 0.0 0.0 - 0.1 K/uL  Culture, blood (routine x 2)     Status: None (Preliminary result)   Collection Time: 08/23/14  7:36 PM  Result Value Ref Range   Specimen Description BLOOD RIGHT ANTECUBITAL    Special Requests BOTTLES DRAWN AEROBIC AND ANAEROBIC  5CC EA    Culture  Setup Time      GRAM NEGATIVE RODS IN BOTH AEROBIC AND ANAEROBIC BOTTLES CRITICAL RESULT CALLED TO, READ BACK BY AND VERIFIED WITH:  P CLAUDIO 08/24/14 _0  M VESTAL    Culture GRAM NEGATIVE RODS    Report Status PENDING   I-Stat CG4 Lactic Acid, ED  (not at [facility])     Status: Abnormal   Collection Time: 08/23/14  7:52 PM  Result Value Ref Range   Lactic Acid, Venous 4.88 (HH) 0.5 - 2.0 mmol/L   Comment NOTIFIED PHYSICIAN   Culture, blood (routine x 2)     Status: None (Preliminary result)   Collection Time: 08/23/14  8:04 PM  Result Value Ref Range   Specimen Description BLOOD RIGHT HAND    Special Requests BOTTLES DRAWN AEROBIC AND ANAEROBIC 4CC EA    Culture  Setup Time      GRAM NEGATIVE RODS ANAEROBIC BOTTLE ONLY CRITICAL RESULT CALLED TO, READ BACK BY AND VERIFIED WITH: T PHILLIPS 08/24/14 @ [place] NEGATIVE RODS    Report Status PENDING   Lactic acid, plasma     Status: Abnormal   Collection Time: 08/23/14  8:04 PM  Result Value Ref Range   Lactic Acid, Venous 3.6 (HH) 0.5 - 2.0 mmol/L    Comment: CRITICAL RESULT CALLED TO, READ BACK BY AND VERIFIED WITH: FERRAINOLO,J RN 08/23/2014 2124 JORDANS REPEATED TO VERIFY   Urinalysis, Routine w reflex microscopic (not at [facility])     Status: Abnormal   Collection Time: 08/23/14  9:13 PM  Result Value Ref Range   Color, Urine YELLOW YELLOW   APPearance TURBID (A) CLEAR   Specific Gravity, Urine >1.030 (H) 1.005 - 1.030   pH 6.5 5.0 - 8.0   Glucose, UA NEGATIVE NEGATIVE mg/dL   Hgb urine dipstick LARGE (A) NEGATIVE   Bilirubin Urine NEGATIVE NEGATIVE   Ketones, ur 15 (A) NEGATIVE mg/dL   Protein, ur >300 (A) NEGATIVE mg/dL   Urobilinogen, UA 1.0 0.0 - 1.0 mg/dL   Nitrite POSITIVE (A) NEGATIVE   Leukocytes, UA LARGE (A) NEGATIVE  Urine microscopic-add on     Status: Abnormal   Collection Time: 08/23/14  9:13 PM  Result Value Ref Range   WBC, UA TOO NUMEROUS TO COUNT <3 WBC/hpf   RBC /  HPF 21-50 <3 RBC/hpf   Bacteria, UA MANY (A) RARE  I-Stat CG4 Lactic Acid, ED  (not at [facility])     Status: None   Collection Time: 08/23/14 10:26 PM  Result Value Ref Range   Lactic Acid, Venous 1.27 0.5 - 2.0 mmol/L  Troponin I (q 6hr x 3)     Status: Abnormal   Collection Time: 08/23/14 10:27 PM  Result Value Ref Range   Troponin I 0.32 (H) <0.031 ng/mL    Comment:        PERSISTENTLY INCREASED TROPONIN VALUES IN THE RANGE OF 0.04-0.49 ng/mL CAN BE SEEN IN:       -UNSTABLE ANGINA       -CONGESTIVE HEART FAILURE       -MYOCARDITIS       -CHEST TRAUMA       -ARRYHTHMIAS       -LATE PRESENTING MYOCARDIAL INFARCTION       -COPD   CLINICAL FOLLOW-UP RECOMMENDED.   Magnesium     Status: Abnormal   Collection Time: 08/23/14 10:27 PM  Result Value Ref Range   Magnesium 1.0 (L) 1.7 - 2.4 mg/dL  CBG monitoring, ED     Status: Abnormal   Collection Time: 08/24/14 12:11 AM  Result Value Ref Range   Glucose-Capillary 27 (LL) 65 - 99  mg/dL  I-Stat arterial blood gas, ED     Status: Abnormal   Collection Time: 08/24/14 12:28 AM  Result Value Ref Range   pH, Arterial 7.446 7.350 - 7.450   pCO2 arterial 38.7 35.0 - 45.0 mmHg   pO2, Arterial 92.0 80.0 - 100.0 mmHg   Bicarbonate 26.7 (H) 20.0 - 24.0 mEq/L   TCO2 28 0 - 100 mmol/L   O2 Saturation 97.0 %   Acid-Base Excess 2.0 0.0 - 2.0 mmol/L   Patient temperature 98.6 F    Collection site BRACHIAL ARTERY    Drawn by RT    Sample type ARTERIAL   I-Stat CG4 Lactic Acid, ED     Status: Abnormal   Collection Time: 08/24/14 12:41 AM  Result Value Ref Range   Lactic Acid, Venous 2.50 (HH) 0.5 - 2.0 mmol/L   Comment NOTIFIED PHYSICIAN   Lactic acid, plasma     Status: Abnormal   Collection Time: 08/24/14  2:25 AM  Result Value Ref Range   Lactic Acid, Venous 3.7 (HH) 0.5 - 2.0 mmol/L    Comment: CRITICAL RESULT CALLED TO, READ BACK BY AND VERIFIED WITH: F FLYNT,RN [id number] [facility]   Procalcitonin     Status: None   Collection Time:  08/24/14  2:25 AM  Result Value Ref Range   Procalcitonin 50.87 ng/mL    Comment:        Interpretation: PCT >= 10 ng/mL: Important systemic inflammatory response, almost exclusively due to severe bacterial sepsis or septic shock. (NOTE)         ICU PCT Algorithm               Non ICU PCT Algorithm    ----------------------------     ------------------------------         PCT < 0.25 ng/mL                 PCT < 0.1 ng/mL     Stopping of antibiotics            Stopping of antibiotics       strongly encouraged.               strongly encouraged.    ----------------------------     ------------------------------       PCT level decrease by               PCT < 0.25 ng/mL       >= 80% from peak PCT       OR PCT 0.25 - 0.5 ng/mL          Stopping of antibiotics                                             encouraged.     Stopping of antibiotics           encouraged.    ----------------------------     ------------------------------       PCT level decrease by              PCT >= 0.25 ng/mL       < 80% from peak PCT        AND PCT >= 0.5 ng/mL             Continuing antibiotics  encouraged.       Continuing antibiotics            encouraged.    ----------------------------     ------------------------------     PCT level increase compared          PCT > 0.5 ng/mL         with peak PCT AND          PCT >= 0.5 ng/mL             Escalation of antibiotics                                          strongly encouraged.      Escalation of antibiotics        strongly encouraged.   MRSA PCR Screening     Status: None   Collection Time: 08/24/14  2:26 AM  Result Value Ref Range   MRSA by PCR NEGATIVE NEGATIVE    Comment:        The GeneXpert MRSA Assay (FDA approved for NASAL specimens only), is one component of a comprehensive MRSA colonization surveillance program. It is not intended to diagnose MRSA infection nor to guide or monitor  treatment for MRSA infections.   Glucose, capillary     Status: Abnormal   Collection Time: 08/24/14  3:56 AM  Result Value Ref Range   Glucose-Capillary 147 (H) 65 - 99 mg/dL   Comment 1 Notify RN   Troponin I (q 6hr x 3)     Status: Abnormal   Collection Time: 08/24/14  4:27 AM  Result Value Ref Range   Troponin I 1.13 (HH) <0.031 ng/mL    Comment:        POSSIBLE MYOCARDIAL ISCHEMIA. SERIAL TESTING RECOMMENDED. CRITICAL RESULT CALLED TO, READ BACK BY AND VERIFIED WITH: [facility] [id number] [place]   Lactic acid, plasma     Status: None   Collection Time: 08/24/14  4:46 AM  Result Value Ref Range   Lactic Acid, Venous 2.0 0.5 - 2.0 mmol/L  Vitamin B12     Status: Abnormal   Collection Time: 08/24/14  4:48 AM  Result Value Ref Range   Vitamin B-12 1127 (H) 180 - 914 pg/mL    Comment: (NOTE) This assay is not validated for testing neonatal or myeloproliferative syndrome specimens for Vitamin B12 levels.   Iron and TIBC     Status: Abnormal   Collection Time: 08/24/14  4:48 AM  Result Value Ref Range   Iron 7 (L) 45 - 182 ug/dL   TIBC 175 (L) 250 - 450 ug/dL   Saturation Ratios 4 (L) 17.9 - 39.5 %   UIBC 168 ug/dL  Ferritin     Status: None   Collection Time: 08/24/14  4:48 AM  Result Value Ref Range   Ferritin 259 24 - 336 ng/mL  Reticulocytes     Status: Abnormal   Collection Time: 08/24/14  4:48 AM  Result Value Ref Range   Retic Ct Pct 1.7 0.4 - 3.1 %   RBC. 3.21 (L) 4.22 - 5.81 MIL/uL   Retic Count, Manual 54.6 19.0 - 186.0 K/uL  CBC     Status: Abnormal   Collection Time: 08/24/14  4:48 AM  Result Value Ref Range   WBC 16.0 (H) 4.0 - 10.5 K/uL   RBC 3.21 (L) 4.22 - 5.81 MIL/uL   Hemoglobin  9.0 (L) 13.0 - 17.0 g/dL   HCT 27.8 (L) 39.0 - 52.0 %   MCV 86.6 78.0 - 100.0 fL   MCH 28.0 26.0 - 34.0 pg   MCHC 32.4 30.0 - 36.0 g/dL   RDW 16.5 (H) 11.5 - 15.5 %   Platelets 68 (L) 150 - 400 K/uL    Comment: CONSISTENT WITH PREVIOUS RESULT  Basic metabolic  panel     Status: Abnormal   Collection Time: 08/24/14  4:48 AM  Result Value Ref Range   Sodium 136 135 - 145 mmol/L   Potassium 3.7 3.5 - 5.1 mmol/L   Chloride 99 (L) 101 - 111 mmol/L   CO2 27 22 - 32 mmol/L   Glucose, Bld 169 (H) 65 - 99 mg/dL   BUN 14 6 - 20 mg/dL   Creatinine, Ser 3.56 (H) 0.61 - 1.24 mg/dL   Calcium 8.3 (L) 8.9 - 10.3 mg/dL   GFR calc non Af Amer 16 (L) >60 mL/min   GFR calc Af Amer 19 (L) >60 mL/min    Comment: (NOTE) The eGFR has been calculated using the CKD EPI equation. This calculation has not been validated in all clinical situations. eGFR's persistently <60 mL/min signify possible Chronic Kidney Disease.    Anion gap 10 5 - 15  Magnesium     Status: None   Collection Time: 08/24/14  4:48 AM  Result Value Ref Range   Magnesium 2.1 1.7 - 2.4 mg/dL  Phosphorus     Status: None   Collection Time: 08/24/14  4:48 AM  Result Value Ref Range   Phosphorus 3.2 2.5 - 4.6 mg/dL  Folate     Status: Abnormal   Collection Time: 08/24/14  5:00 AM  Result Value Ref Range   Folate 3.7 (L) >5.9 ng/mL  Glucose, capillary     Status: Abnormal   Collection Time: 08/24/14  8:07 AM  Result Value Ref Range   Glucose-Capillary 166 (H) 65 - 99 mg/dL  Troponin I (q 6hr x 3)     Status: Abnormal   Collection Time: 08/24/14 10:50 AM  Result Value Ref Range   Troponin I 2.56 (HH) <0.031 ng/mL    Comment:        POSSIBLE MYOCARDIAL ISCHEMIA. SERIAL TESTING RECOMMENDED. CRITICAL VALUE NOTED.  VALUE IS CONSISTENT WITH PREVIOUSLY REPORTED AND CALLED VALUE.   Glucose, capillary     Status: Abnormal   Collection Time: 08/24/14 12:15 PM  Result Value Ref Range   Glucose-Capillary 215 (H) 65 - 99 mg/dL    Dg Chest Portable 1 View  08/24/2014   CLINICAL DATA:  Initial valuation for central line placement.  EXAM: PORTABLE CHEST - 1 VIEW  COMPARISON:  Prior radiograph from earlier the same day.  FINDINGS: There has been interval placement of a left IJ approach central venous  catheter with tip overlying the cavoatrial junction. Right-sided dialysis catheter is unchanged. Cardiomegaly stable. Mediastinal silhouette within normal limits. Atheromatous plaque within the aortic arch.  Lungs are normally inflated. Diffuse pulmonary vascular congestion and indistinctness of the interstitial markings again seen, suggesting mild edema. More patchy and linear right basilar opacity may reflect atelectasis or superimposed infiltrate. No pneumothorax.  IMPRESSION: 1. Interval placement of left IJ approach central venous catheter with tip overlying the cavoatrial junction. 2. Stable cardiomegaly with diffuse pulmonary vascular congestion and indistinctness of the interstitial markings, suggesting mild edema. 3. Superimposed mild patchy and linear right basilar opacity, which may reflect atelectasis or possible infiltrate.  Electronically Signed   By: Jeannine Boga M.D.   On: 08/24/2014 00:26   Dg Chest Portable 1 View  08/23/2014   CLINICAL DATA:  Code sepsis.  No chest complaints.  EXAM: PORTABLE CHEST - 1 VIEW  COMPARISON:  One-view chest x-ray 08/04/2014.  FINDINGS: The heart is enlarged. A right IJ dialysis catheter is in place. Atherosclerotic calcifications are present at the aortic arch. Slight increased interstitial markings suggests mild edema. No focal airspace consolidation is evident.  IMPRESSION: 1. Cardiomegaly and slight increased interstitial pattern compatible with edema. 2. No focal airspace consolidation to suggest infection. 3. Right IJ Port-A-Cath is stable in position. 4. Atherosclerosis of the thoracic aorta.   Electronically Signed   By: San Morelle M.D.   On: 08/23/2014 19:35    Review of Systems  Constitutional: Positive for fever, chills and malaise/fatigue.  Eyes: Negative for double vision.  Respiratory: Negative for cough, hemoptysis and shortness of breath.   Cardiovascular: Negative for chest pain, palpitations, orthopnea, claudication and leg  swelling.  Gastrointestinal: Positive for nausea. Negative for abdominal pain and diarrhea.  Genitourinary: Negative for dysuria.  Neurological: Positive for dizziness and weakness. Negative for headaches.   Blood pressure 133/60, pulse 78, temperature 98.1 F (36.7 C), temperature source Oral, resp. rate 17, weight 67.1 kg (147 lb 14.9 oz), SpO2 100 %. Physical Exam  Constitutional: He is oriented to person, place, and time.  Eyes: Conjunctivae are normal. Left eye exhibits no discharge.  Neck: Neck supple. No tracheal deviation present. No thyromegaly present.  Cardiovascular:  Irregularly irregular S1 and S2 soft  soft systolic murmur noted. No pericardial rub  Respiratory: Effort normal and breath sounds normal. No respiratory distress. He has no rales.  GI: Soft. Bowel sounds are normal. He exhibits no distension. There is no tenderness.  Musculoskeletal: He exhibits no edema or tenderness.  Dry gangrene of the left fifth toe noted  Neurological: He is alert and oriented to person, place, and time.    Assessment/Plan: Acute small non-Q-wave myocardial infarction probably secondary to demand ischemia/hypotension rule out progression of CAD Multivessel CAD history of silent inferoposterior wall MI in the past with occluded RCA and left circumflex as above Status post septic shock workup in progress Status post hypoglycemic shock New-onset A. fib flutter with CHADS2 score of 5  Hypertension Diabetes mellitus Peripheral vascular disease status post PTA to left SFA in May 2016 Dry gangrene of fifth left toe with history of MSSA osteomyelitis History of CVA with left paresis History of hepatitis C with cirrhosis of liver End-stage renal disease on hemodialysis Hypercholesteremia Generalised cachexia Anemia of chronic disease  Plan Check serial enzymes and EKG in a.m. Start heparin per pharmacy protocol Reduce aspirin to 81 mg daily Check 2-D echo Start IV amiodarone as per  orders Will discuss with family and patient regarding cardiac catheterization possible PTCA stenting once more stable  Charolette Forward 08/24/2014, 4:24 PM

## 2014-08-24 NOTE — Consult Note (Signed)
Renal Service Consult Note [facility]  Greg [name] 08/24/2014 Greg [name] D Requesting Physician:  Dr Sherene Sires  Reason for Consult:  ESRD pt with gram negative sepsis HPI: The patient is a 69 y.o. year-old with hx of CVA, DM2, etoh abuse, hep C w cirrhosis, PVD w left foot toe amp in May '16,  HTN, CAD who presented brought by family from dialysis for nausea, vomiting and fevers on 8/13. He was being admitted then decompensated with increasing lethargy, confusion , bradycardia and hypotension. He was started on pressors and BP improved.  Pressors were weaned off overnight.  Blood cx's today are growing GNR's. He is now off of pressors.  Confusion better but not entirely gone. BS on presentation was <30.  Asked to see for dialysis.    Past Medical History  Past Medical History  Diagnosis Date  . Renal disorder     HD patient  . Cirrhosis   . Alcohol abuse   . Stroke     left sided deficits  . Hypertension   . Diabetes mellitus     type 2   . Hepatitis     hep c  . Constipation   . Coronary artery disease    Past Surgical History  Past Surgical History  Procedure Laterality Date  . Eye surgery    . Av fistula placement  2006    L upper arm  . Peripheral vascular catheterization N/A 06/06/2014    Procedure: Abdominal Aortogram;  Surgeon: Sherren Kerns, MD;  Location: [facility] INVASIVE CV LAB;  Service: Cardiovascular;  Laterality: N/A;  . Lower extremity angiogram Bilateral 06/06/2014    Procedure: Lower Extremity Angiogram;  Surgeon: Sherren Kerns, MD;  Location: [facility] INVASIVE CV LAB;  Service: Cardiovascular;  Laterality: Bilateral;  . Peripheral vascular catheterization Left 06/06/2014    Procedure: Peripheral Vascular Intervention;  Surgeon: Sherren Kerns, MD;  Location: [facility] INVASIVE CV LAB;  Service: Cardiovascular;  Laterality: Left;  pta drug coated x3  . Revison of arteriovenous fistula Left 08/04/2014    Procedure: PLICATION OF  LEFT UPPER ARM ARTERIOVENOUS  FISTULA;  Surgeon: Sherren Kerns, MD;  Location: [facility] OR;  Service: Vascular;  Laterality: Left;  . Insertion of dialysis catheter Right 08/04/2014    Procedure: INSERTION OF DIALYSIS CATHETER RIGHT INTERNAL JUGULAR VEIN;  Surgeon: Sherren Kerns, MD;  Location: [facility] OR;  Service: Vascular;  Laterality: Right;   Family History  Family History  Problem Relation Age of Onset  . Diabetes Mother   . Hypertension Mother   . Diabetes Father   . Hypertension Father    Social History  reports that he quit smoking about 8 years ago. He has never used smokeless tobacco. He reports that he does not drink alcohol or use illicit drugs. Allergies No Known Allergies Home medications Prior to Admission medications   Medication Sig Start Date End Date Taking? Authorizing Provider  aspirin 325 MG EC tablet Take 325 mg by mouth daily.   Yes Historical Provider, MD  atorvastatin (LIPITOR) 40 MG tablet Take 40 mg by mouth at bedtime.   Yes Historical Provider, MD  calcium acetate (PHOSLO) 667 MG capsule Take 1,334 mg by mouth 2 (two) times daily with a meal.  03/26/14  Yes Historical Provider, MD  carvedilol (COREG CR) 10 MG 24 hr capsule Take 10 mg by mouth as needed (for high HR). Takes on days he does not have dialysis TUES, THURS AND SAT   Yes Historical Provider, MD  clopidogrel (PLAVIX) 75 MG tablet Take 75 mg by mouth every other day.    Yes Historical Provider, MD  insulin NPH-insulin regular (HUMULIN 70/30) (70-30) 100 UNIT/ML injection Inject 10 Units into the skin daily.    Yes Historical Provider, MD  naproxen sodium (ANAPROX) 220 MG tablet Take 220 mg by mouth 2 (two) times daily as needed (for pain).   Yes Historical Provider, MD  sorbitol 70 % solution Take 30 mLs by mouth daily as needed (for laxative).  07/17/14  Yes Historical Provider, MD  clindamycin (CLEOCIN) 150 MG capsule Take 3 capsules (450 mg total) by mouth 3 (three) times daily. 04/24/14   Charlestine Night, PA-C  docusate sodium  (COLACE) 100 MG capsule Take 1 capsule (100 mg total) by mouth 2 (two) times daily. 07/10/13   Reuben Likes, MD   Liver Function Tests  Recent Labs Lab 08/23/14 1934  AST 42*  ALT 10*  ALKPHOS 168*  BILITOT 0.8  PROT 6.2*  ALBUMIN 2.1*   No results for input(s): LIPASE, AMYLASE in the last 168 hours. CBC  Recent Labs Lab 08/23/14 1934 08/24/14 0448  WBC 6.0 16.0*  NEUTROABS 5.8  --   HGB 8.9* 9.0*  HCT 28.3* 27.8*  MCV 86.0 86.6  PLT 77* 68*   Basic Metabolic Panel  Recent Labs Lab 08/23/14 1934 08/24/14 0448  NA 137 136  K 3.1* 3.7  CL 96* 99*  CO2 28 27  GLUCOSE 118* 169*  BUN 11 14  CREATININE 3.52* 3.56*  CALCIUM 8.6* 8.3*  PHOS  --  3.2    Filed Vitals:   08/24/14 1400 08/24/14 1500 08/24/14 1600 08/24/14 1631  BP: 123/56 127/57 133/60   Pulse: 81 78 78   Temp:    98 F (36.7 C)  TempSrc:    Oral  Resp: Weight:      SpO2: 100% 100% 100%    Exam Awake, confused, responsive No rash, cyanosis or gangrene Sclera anicteric, throat clear No jvd Chest rhonchi coarse R base, L clear RRR 2/6 SEM rusb, no RG Abd soft ntnd no mass or hsm +bs GU normal male Ext excoriated legs below the knees bilat, no LE edema LUA AVF w healing zig-zag wound, no signs of infection, +bruit Neuro is alert, O x 2, nonfocal, gen weakness   TTS [place]  4h  63kg  2/2 bath   R IJ cath ( LUA AVF revised on 08/04/14)   Heparin 1900 Venofer 50/ wk Mircera 50 every 2 wks, last 8/11   Assessment: 1. Gram negative sepsis - probably catheter-related sepsis. Doing better after IV abx today.  No need for emergent HD tonight, plan HD tomorrow morning then have tunneled catheter removed by [facility].  The catheter was placed on 7/25 at the time of a plication procedure done for aneurysmal degeneration of the LUA AVF.  2. ESRD on HD TTS, missed HD yesterday 3. Anemia Hb 9.0, esa as needed 4. MBD cont binders when eating 5. Hep C / cirrhosis 6. Hx CVA 7. Hx etoh abuse 8. Hx  CAD   Plan- abx , supportive care, HD in am. Will need to call [facility] tomorrow to remove [facility] after HD.   Vinson Moselle MD (pgr) [phone]    ([phone] 08/24/2014, 5:42 PM

## 2014-08-24 NOTE — Progress Notes (Signed)
ANTIBIOTIC CONSULT NOTE - FOLLOW UP  Pharmacy Consult:  Vancomycin / Elita Quick Indication:  Sepsis  No Known Allergies  Patient Measurements: Weight: 147 lb 14.9 oz (67.1 kg)  Vital Signs: Temp: 98 F (36.7 C) (08/14 0810) Temp Source: Oral (08/14 0810) BP: 136/Greg mmHg (08/14 0900) Pulse Rate: 91 (08/14 0900) Intake/Output from previous day: 08/13 0701 - 08/14 0700 In: 2200 [I.V.:2100; IV Piggyback:100] Out: -  Intake/Output from this shift: Total I/O In: 120 [P.O.:120] Out: -   Labs:  Recent Labs  08/23/14 1934 08/24/14 0448  WBC 6.0 16.0*  HGB 8.9* 9.0*  PLT 77* 68*  CREATININE 3.52* 3.56*   Estimated Creatinine Clearance: 19.1 mL/min (by C-G formula based on Cr of 3.56). No results for input(s): VANCOTROUGH, VANCOPEAK, VANCORANDOM, GENTTROUGH, GENTPEAK, GENTRANDOM, TOBRATROUGH, TOBRAPEAK, TOBRARND, AMIKACINPEAK, AMIKACINTROU, AMIKACIN in the last 72 hours.   Microbiology: Recent Results (from the past 720 hour(s))  Culture, blood (routine x 2)     Status: None (Preliminary result)   Collection Time: 08/23/14  7:36 PM  Result Value Ref Range Status   Specimen Description BLOOD RIGHT ANTECUBITAL  Final   Special Requests BOTTLES DRAWN AEROBIC AND ANAEROBIC 5CC EA  Final   Culture  Setup Time   Final    GRAM NEGATIVE RODS ANAEROBIC BOTTLE ONLY CRITICAL RESULT CALLED TO, READ BACK BY AND VERIFIED WITH: P CLAUDIO 08/24/14 @1035  M VESTAL    Culture GRAM NEGATIVE RODS  Final   Report Status PENDING  Incomplete  MRSA PCR Screening     Status: None   Collection Time: 08/24/14  2:26 AM  Result Value Ref Range Status   MRSA by PCR NEGATIVE NEGATIVE Final    Comment:        The GeneXpert MRSA Assay (FDA approved for NASAL specimens only), is one component of a comprehensive MRSA colonization surveillance program. It is not intended to diagnose MRSA infection nor to guide or monitor treatment for MRSA infections.      Assessment: Greg [name] with history of recent  MSSA osteomyelitis of the 3rd toe to continue on vancomycin and ceftazidime for sepsis.  Patient has ESRD on HD TTS and his last session was on 08/23/14 PTA.  Vanc 8/13 >> Elita Quick 8/13 >>  8/13 BCx x2 - GNR (1 of 2) 8/13 UCx - 8/14 MRSA PCR - negative   Goal of Therapy:  Vanc pre-HD level:  15-25 mcg/mL   Plan:  - Vanc 750mg  IV q-HD TTS - Fortaz 2gm IV q-HD TTS - F/U Renal consult and HD schedule - Monitor micro data to de-escalate abx - F/U ECHO, iron and folate supplementation, add multivitamin and thiamine for hx EtOH - Watch plts and may need to hold heparin SQ    Johathon Overturf D. Laney Potash, PharmD, BCPS Pager:  [phone] 08/24/2014, 11:13 AM

## 2014-08-24 NOTE — Consult Note (Addendum)
CARDIOLOGY CONSULT NOTE   Patient ID: Greg [name] MRN: [id number], DOB/AGE: 1945/09/26   Admit date: 08/23/2014 Date of Consult: 08/24/2014  Primary Physician: Rinaldo Cloud, MD  Primary Cardiologist:  NA  Reason for Consult:  EKG abnormalities  HPI:  69 yo M w a h/o medically managed severe CAD, DM with peripheral neuropathy, HTN, ESRD, HCV with cirrhosis, Alcohol abuse, CVA with left-sided deficits, recent MSSA osteomyelitis of his his right 3rd toe from blisters acquired during a recent trip to [place], & PVD s/p L SFA angioplasty (05/2014) initially presented yesterday with emesis & lethargy, found to be febrile (102.9) & hypotensive (80's/50's transiently requiring NE) with elevated lactate.  Differential sources of sepsis include right foot gangrene/osteomyelitis versus UTI versus dialysis catheter related.  Cardiology was consulted regarding an EKG concerning for a possible posterior MI with significant depression in V2 & V3 much more exaggerated than his baseline LVH with repolarization.  Inferior infarct pattern was stable from prior.  The patient denied chest pain.  Following stabilization of his BP, his EKG abnormalities improved, though his troponin bumped from 0.3 to 1.1 overnight.  He denied chest pain, lightheadedness, or dyspnea.  Of note, the pt remotely followed Dr. Jens Som with his most recent noted seen from 2008.   Relevant Data - Cardiac catheterization (2007):  Diag with 50% narrowing, mLCx totally occluded, pRCA 50% mRCA totally occluded  Problem List  Past Medical History  Diagnosis Date  . Renal disorder     HD patient  . Cirrhosis   . Alcohol abuse   . Stroke     left sided deficits  . Hypertension   . Diabetes mellitus     type 2   . Hepatitis     hep c  . Constipation     Past Surgical History  Procedure Laterality Date  . Eye surgery    . Av fistula placement  2006    L upper arm  . Peripheral vascular catheterization N/A 06/06/2014   Procedure: Abdominal Aortogram;  Surgeon: Sherren Kerns, MD;  Location: [facility] INVASIVE CV LAB;  Service: Cardiovascular;  Laterality: N/A;  . Lower extremity angiogram Bilateral 06/06/2014    Procedure: Lower Extremity Angiogram;  Surgeon: Sherren Kerns, MD;  Location: [facility] INVASIVE CV LAB;  Service: Cardiovascular;  Laterality: Bilateral;  . Peripheral vascular catheterization Left 06/06/2014    Procedure: Peripheral Vascular Intervention;  Surgeon: Sherren Kerns, MD;  Location: [facility] INVASIVE CV LAB;  Service: Cardiovascular;  Laterality: Left;  pta drug coated x3  . Revison of arteriovenous fistula Left 08/04/2014    Procedure: PLICATION OF  LEFT UPPER ARM ARTERIOVENOUS FISTULA;  Surgeon: Sherren Kerns, MD;  Location: [facility] OR;  Service: Vascular;  Laterality: Left;  . Insertion of dialysis catheter Right 08/04/2014    Procedure: INSERTION OF DIALYSIS CATHETER RIGHT INTERNAL JUGULAR VEIN;  Surgeon: Sherren Kerns, MD;  Location: [facility] OR;  Service: Vascular;  Laterality: Right;    Allergies  No Known Allergies  Inpatient Medications  . aspirin  325 mg Oral Daily  . atorvastatin  40 mg Oral QHS  . calcium acetate  1,334 mg Oral BID WC  . clopidogrel  75 mg Oral QODAY  . heparin  5,000 Units Subcutaneous 3 times per day  . insulin aspart  0-9 Units Subcutaneous 6 times per day   Family History Family History  Problem Relation Age of Onset  . Diabetes Mother   . Hypertension Mother   . Diabetes Father   .  Hypertension Father     Social History Social History   Social History  . Marital Status: Married    Spouse Name: N/A  . Number of Children: N/A  . Years of Education: N/A   Occupational History  . Not on file.   Social History Main Topics  . Smoking status: Former Smoker    Quit date: 01/10/2006  . Smokeless tobacco: Never Used  . Alcohol Use: No     Comment: Former  . Drug Use: No  . Sexual Activity: Not on file   Other Topics Concern  . Not on file   Social  History Narrative    Review of Systems  General:  Positive for fever.  No chills, night sweats or weight changes.  Cardiovascular:  No chest pain, dyspnea on exertion, edema, orthopnea, palpitations, paroxysmal nocturnal dyspnea. Dermatological: No rash, lesions/masses Respiratory: No cough, dyspnea Urologic: No hematuria, dysuria Abdominal:   No nausea, vomiting, diarrhea, bright red blood per rectum, melena, or hematemesis Neurologic:  No visual changes, wkns, changes in mental status. All other systems reviewed and are otherwise negative except as noted above.  Physical Exam  Blood pressure 154/102, pulse 94, temperature 97.7 F (36.5 C), temperature source Oral, resp. rate 16, weight 147 lb 14.9 oz (67.1 kg), SpO2 100 %.  General: Pleasant, NAD Psych: Normal affect. Neuro: Alert and oriented X 3. Moves all extremities spontaneously. HEENT: Normal  Neck: Supple without bruits or JVD. Lungs:  Resp regular and unlabored, CTA. Heart: RRR no s3, s4, or murmurs. Abdomen: Soft, non-tender, non-distended, BS + x 4.  Extremities: No clubbing, cyanosis or edema. DP/PT/Radials 2+ and equal bilaterally.  Labs   Recent Labs  08/23/14 1930 08/23/14 2227 08/24/14 0427  TROPONINI 0.35* 0.32* 1.13*   Lab Results  Component Value Date   WBC 16.0* 08/24/2014   HGB 9.0* 08/24/2014   HCT 27.8* 08/24/2014   MCV 86.6 08/24/2014   PLT 68* 08/24/2014    Recent Labs Lab 08/23/14 1934 08/24/14 0448  NA 137 136  K 3.1* 3.7  CL 96* 99*  CO2 28 27  BUN 11 14  CREATININE 3.52* 3.56*  CALCIUM 8.6* 8.3*  PROT 6.2*  --   BILITOT 0.8  --   ALKPHOS 168*  --   ALT 10*  --   AST 42*  --   GLUCOSE 118* 169*   Lab Results  Component Value Date   CHOL 155 08/24/2011   HDL 34* 08/24/2011   LDLCALC 90 08/24/2011   TRIG 154* 08/24/2011   No results found for: DDIMER  Radiology/Studies  Dg Chest Portable 1 View  08/24/2014   CLINICAL DATA:  Initial valuation for central line  placement.  EXAM: PORTABLE CHEST - 1 VIEW  COMPARISON:  Prior radiograph from earlier the same day.  FINDINGS: There has been interval placement of a left IJ approach central venous catheter with tip overlying the cavoatrial junction. Right-sided dialysis catheter is unchanged. Cardiomegaly stable. Mediastinal silhouette within normal limits. Atheromatous plaque within the aortic arch.  Lungs are normally inflated. Diffuse pulmonary vascular congestion and indistinctness of the interstitial markings again seen, suggesting mild edema. More patchy and linear right basilar opacity may reflect atelectasis or superimposed infiltrate. No pneumothorax.  IMPRESSION: 1. Interval placement of left IJ approach central venous catheter with tip overlying the cavoatrial junction. 2. Stable cardiomegaly with diffuse pulmonary vascular congestion and indistinctness of the interstitial markings, suggesting mild edema. 3. Superimposed mild patchy and linear right basilar opacity, which may  reflect atelectasis or possible infiltrate.   Electronically Signed   By: Rise Mu M.D.   On: 08/24/2014 00:26   Dg Chest Portable 1 View  08/23/2014   CLINICAL DATA:  Code sepsis.  No chest complaints.  EXAM: PORTABLE CHEST - 1 VIEW  COMPARISON:  One-view chest x-ray 08/04/2014.  FINDINGS: The heart is enlarged. A right IJ dialysis catheter is in place. Atherosclerotic calcifications are present at the aortic arch. Slight increased interstitial markings suggests mild edema. No focal airspace consolidation is evident.  IMPRESSION: 1. Cardiomegaly and slight increased interstitial pattern compatible with edema. 2. No focal airspace consolidation to suggest infection. 3. Right IJ Port-A-Cath is stable in position. 4. Atherosclerosis of the thoracic aorta.   Electronically Signed   By: Marin Roberts M.D.   On: 08/23/2014 19:35   Dg Chest Port 1 View  08/04/2014   CLINICAL DATA:  Dialysis catheter insertion  EXAM: PORTABLE  CHEST - 1 VIEW  COMPARISON:  07/19/2014  FINDINGS: Right jugular dual-lumen catheter is been placed with tips in the SVC and right atrium. No pneumothorax  Cardiac enlargement without heart failure or edema. Mild bibasilar atelectasis. Negative for pneumonia.  IMPRESSION: Satisfactory dual-lumen catheter placement in the right atrium. No pneumothorax  Mild atelectasis in the lung bases.   Electronically Signed   By: Marlan Palau M.D.   On: 08/04/2014 10:51   Dg Fluoro Guide Cv Line-no Report  08/04/2014   CLINICAL DATA:    FLOURO GUIDE CV LINE  Fluoroscopy was utilized by the requesting physician.  No radiographic  interpretation.    A/P  69 yo M w medically managed severe CAD, DM with peripheral neuropathy, HTN, ESRD, HCV with cirrhosis, Alcohol abuse, CVA with left-sided deficits, recent MSSA osteomyelitis of his his right 3rd toe from blisters acquired during a recent trip to [place], & PVD s/p L SFA angioplasty (05/2014) p/w septic shock & transient EKG changes with troponinemia, reflective of demand ischemia.    # EKG abnormalities & troponin elevation in the setting of known CAD - Both reflective of demand ischemia in the setting of sepsis +/- bleeding (Hgb nearly 3 points lower than baseline).  EKG abnormalities have now improved.  - Continue to monitor serial troponin until peaked. - Agree with obtaining an echocardiogram.   - Agree with his ASA (though he only needs 81 rather than 325 from the perspective of Cardiology, would defer to Vascular regarding their preference with their recent intervention, though) & Clopidogrel that he is receiving for his recent SFA intervention as well as his Atorvastatin. - No need for anticoagulation at this time.  # Hypertension - Agree with holding home Carvedilol in the setting of septic shock.    # Hyperlipidemia - Agree with continuation of his home Atorvastatin.    Signed, Julaine Hua, MD

## 2014-08-24 NOTE — Progress Notes (Signed)
ANTICOAGULATION CONSULT NOTE - Initial Consult  Pharmacy Consult for heparin Indication: atrial fibrillation  No Known Allergies  Patient Measurements: Weight: 147 lb 14.9 oz (67.1 kg) Heparin Dosing Weight: 67.1 kg  Vital Signs: Temp: 98 F (36.7 C) (08/14 1631) Temp Source: Oral (08/14 1631) BP: 133/60 mmHg (08/14 1600) Pulse Rate: 78 (08/14 1600)  Labs:  Recent Labs  08/23/14 1934 08/23/14 2227 08/24/14 0427 08/24/14 0448 08/24/14 1050  HGB 8.9*  --   --  9.0*  --   HCT 28.3*  --   --  27.8*  --   PLT 77*  --   --  68*  --   CREATININE 3.52*  --   --  3.56*  --   TROPONINI  --  0.32* 1.13*  --  2.56*    Estimated Creatinine Clearance: 19.1 mL/min (by C-G formula based on Cr of 3.56).   Medical History: Past Medical History  Diagnosis Date  . Renal disorder     HD patient  . Cirrhosis   . Alcohol abuse   . Stroke     left sided deficits  . Hypertension   . Diabetes mellitus     type 2   . Hepatitis     hep c  . Constipation   . Coronary artery disease     Medications:  Scheduled:  . amiodarone  150 mg Intravenous Once  . [START ON 08/25/2014] aspirin  81 mg Oral Daily  . atorvastatin  40 mg Oral QHS  . calcium acetate  1,334 mg Oral BID WC  . [START ON 08/26/2014] cefTAZidime (FORTAZ)  IV  2 g Intravenous Q T,Th,Sa-HD  . clopidogrel  75 mg Oral QODAY  . insulin aspart  0-9 Units Subcutaneous 6 times per day  . magnesium sulfate 1 - 4 g bolus IVPB  2 g Intravenous Once  . [START ON 08/26/2014] vancomycin  750 mg Intravenous Q T,Th,Sa-HD   Infusions:  . amiodarone     Followed by  . [START ON 08/25/2014] amiodarone      Assessment: 69 yo male with afib will be started on heparin.  Patient got one dose of heparin 5000 units sq @ 1412.  Hgb 9 and Plt 68 K. MD ok to start.   Goal of Therapy:  Heparin level 0.3-0.7 units/ml Monitor platelets by anticoagulation protocol: Yes   Plan:  - Start heparin at 900 units/hr. No bolus - 8 hr heparin  level - daily heparin level and CBC  Sarely Stracener, Tsz-Yin 08/24/2014,4:51 PM

## 2014-08-24 NOTE — Progress Notes (Signed)
eLink Physician-Brief Progress Note Patient Name: Khai Sigler DOB: Aug 15, 1945 MRN: [id number]   Date of Service  08/24/2014  HPI/Events of Note  Nausea and vomiting multiple times today w/o abd pain.  eICU Interventions  zofran Check LFT's along with lipase     Intervention Category Minor Interventions: Other:  Henry Russel, Demetrius Charity 08/24/2014, 10:49 PM

## 2014-08-24 NOTE — Progress Notes (Signed)
PULMONARY / CRITICAL CARE MEDICINE   Name: Greg [name] MRN: [id number] DOB: [birth year]    ADMISSION DATE:  08/23/2014 CONSULTATION DATE:  08/23/14  REFERRING MD :  Dr Lovell Sheehan  CHIEF COMPLAINT:  Fever N/V  INITIAL PRESENTATION: 69 y/o M admitted 8/14 with weakness, nausea, vomiting and fever.  In ER noted to have AMS, bradycardia and hypotension.  Central line placed and started on norepinephrine.  CBG then checked and patient noted to be hypoglycemic (CBG <30).  BP improved and mental status improved with glucose.  Admitted to ICU for observation.     SIGNIFICANT EVENTS: 8/14  Early am admit with AMS, hypotension, bradycardia  8/14  Afternoon, off pressors  STUDIES:  8/14  ECHO >>   SUBJECTIVE:  Pt denies pain, SOB.  RN reports pt off vasopressors  VITAL SIGNS: Temp:  [97.5 F (36.4 C)-102.9 F (39.4 C)] 98.1 F (36.7 C) (08/14 1214) Pulse Rate:  [45-119] 91 (08/14 0900) Resp:  [0-28] 20 (08/14 0900) BP: (77-154)/(43-102) 136/65 mmHg (08/14 0900) SpO2:  [92 %-100 %] 100 % (08/14 0900) Weight:  [147 lb 14.9 oz (67.1 kg)] 147 lb 14.9 oz (67.1 kg) (08/14 0500)   HEMODYNAMICS:     VENTILATOR SETTINGS:     INTAKE / OUTPUT:  Intake/Output Summary (Last 24 hours) at 08/24/14 1353 Last data filed at 08/24/14 0900  Gross per 24 hour  Intake   2320 ml  Output      0 ml  Net   2320 ml    PHYSICAL EXAMINATION: General: chronically ill appearing adult male in NAD Neuro: awake/alert, MAE, speech clear CV: s1s2 rrr, no m/r/g PULM: resp's even/non-labored, lungs bilaterally clear  GI: soft, NTND, bsx4 active  Extremities: warm/dry, multiple scabs on LE's of varying stages, no edema   LABS:  CBC  Recent Labs Lab 08/23/14 1934 08/24/14 0448  WBC 6.0 16.0*  HGB 8.9* 9.0*  HCT 28.3* 27.8*  PLT 77* 68*   Coag's No results for input(s): APTT, INR in the last 168 hours.   BMET  Recent Labs Lab 08/23/14 1934 08/24/14 0448  NA 137 136  K 3.1* 3.7  CL 96* 99*   CO2 28 27  BUN 11 14  CREATININE 3.52* 3.56*  GLUCOSE 118* 169*   Electrolytes  Recent Labs Lab 08/23/14 1934 08/23/14 2227 08/24/14 0448  CALCIUM 8.6*  --  8.3*  MG  --  1.0* 2.1  PHOS  --   --  3.2   Sepsis Markers  Recent Labs Lab 08/24/14 0041 08/24/14 0225 08/24/14 0446  LATICACIDVEN 2.50* 3.7* 2.0  PROCALCITON  --  50.87  --    ABG  Recent Labs Lab 08/24/14 0028  PHART 7.446  PCO2ART 38.7  PO2ART 92.0   Liver Enzymes  Recent Labs Lab 08/23/14 1934  AST 42*  ALT 10*  ALKPHOS 168*  BILITOT 0.8  ALBUMIN 2.1*   Cardiac Enzymes  Recent Labs Lab 08/23/14 2227 08/24/14 0427 08/24/14 1050  TROPONINI 0.32* 1.13* 2.56*   Glucose  Recent Labs Lab 08/24/14 0011 08/24/14 0356 08/24/14 0807 08/24/14 1215  GLUCAP 27* 147* 166* 215*    Imaging Dg Chest Portable 1 View  08/24/2014   CLINICAL DATA:  Initial valuation for central line placement.  EXAM: PORTABLE CHEST - 1 VIEW  COMPARISON:  Prior radiograph from earlier the same day.  FINDINGS: There has been interval placement of a left IJ approach central venous catheter with tip overlying the cavoatrial junction. Right-sided dialysis catheter is unchanged.  Cardiomegaly stable. Mediastinal silhouette within normal limits. Atheromatous plaque within the aortic arch.  Lungs are normally inflated. Diffuse pulmonary vascular congestion and indistinctness of the interstitial markings again seen, suggesting mild edema. More patchy and linear right basilar opacity may reflect atelectasis or superimposed infiltrate. No pneumothorax.  IMPRESSION: 1. Interval placement of left IJ approach central venous catheter with tip overlying the cavoatrial junction. 2. Stable cardiomegaly with diffuse pulmonary vascular congestion and indistinctness of the interstitial markings, suggesting mild edema. 3. Superimposed mild patchy and linear right basilar opacity, which may reflect atelectasis or possible infiltrate.    Electronically Signed   By: Rise Mu M.D.   On: 08/24/2014 00:26   Dg Chest Portable 1 View  08/23/2014   CLINICAL DATA:  Code sepsis.  No chest complaints.  EXAM: PORTABLE CHEST - 1 VIEW  COMPARISON:  One-view chest x-ray 08/04/2014.  FINDINGS: The heart is enlarged. A right IJ dialysis catheter is in place. Atherosclerotic calcifications are present at the aortic arch. Slight increased interstitial markings suggests mild edema. No focal airspace consolidation is evident.  IMPRESSION: 1. Cardiomegaly and slight increased interstitial pattern compatible with edema. 2. No focal airspace consolidation to suggest infection. 3. Right IJ Port-A-Cath is stable in position. 4. Atherosclerosis of the thoracic aorta.   Electronically Signed   By: Marin Roberts M.D.   On: 08/23/2014 19:35     ASSESSMENT / PLAN:  PULMONARY OETT A:  No acute issues P:   Pulmonary hygiene: IS Oxygen as needed to support sats > 92%  CARDIOVASCULAR L CVC 8/13 >>  A:  Atrial fibrillation - ? New onset Hypertension - on coreg on non-dialysis days Coronary artery disease -  on ASA/plavix at home Elevated Troponin - suspect demand ischemia  P:  Hold home antihypertensives.  Trend troponin Follow up ECHO  ASA, lipitor  Cardiology consulted for AF, no anti-coagulation rec's at this time.  History of bleeding episodes from his fistula site KVO IVF's   RENAL A:   ESRD on HD T/Th/Sat Hypomagnesemia - pt was unable to tolerate full HD session due to increasing weakness.  P:   Nephrology consulted, no emergent needs for HD Will need new line with GNR bacteremia.  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  DDx of right upper chest dialysis, gangrene (appears to be dry gangrene) on his 5th digit on left foot, hx of MSSA osteo P:   BCx2 8/13 >> GNR >>  UC 8/14 >>   Vanco, start date 8/13, day 2/x Elita Quick, start date 8/13, day 2/x  Will need to remove perm cath from R IJ and allow for line holiday if possible  IR consulted for new line placement  ENDOCRINE A:   Symptomatic Hypoglycemia - on admission DM2  P:   SSI with sensitive   NEUROLOGIC A:   Toxic metabolic encephalopathy 2/2 sepsis, improved s/p therapy for BP / hypoglycemia  P:   Monitor mental status.  RASS goal: 0    FAMILY  - Updates: patient  updated at bedside.    - Inter-disciplinary family meet or Palliative Care meeting due by: 8/20     Canary Brim, NP-C [facility] Pgr: [phone] or if no answer [phone] 08/24/2014, 1:53 PM  Attending Note:  69 year old male with ESRD-HD who presents in septic shock due to either osteomyelitis vs HD line infection.  He is currently requiring levophed for BP support.  Discussed with renal.  Will ask IR to remove line then a line holiday followed by HD on Tuesday if able.  In the meantime, continue abx, use fluid very gingerly but patient does not appear volume overloaded on exam and will hold in the ICU.  The patient is critically ill with multiple organ systems failure and requires high complexity decision making for assessment and support, frequent evaluation and titration of therapies, application of advanced monitoring technologies and extensive interpretation of multiple databases.   Critical Care Time devoted to patient care services described in this note is  35  Minutes. This time reflects time of care of this signee Dr Koren Bound. This critical care time does not reflect procedure time, or  teaching time or supervisory time of PA/NP/Med student/Med Resident etc but could involve care discussion time.  Alyson Reedy, M.D. [facility] Pulmonary/Critical Care Medicine. Pager: [phone]. After hours pager: [phone].

## 2014-08-25 ENCOUNTER — Inpatient Hospital Stay (HOSPITAL_COMMUNITY): Payer: Medicare Other

## 2014-08-25 ENCOUNTER — Other Ambulatory Visit (HOSPITAL_COMMUNITY): Payer: Medicare Other

## 2014-08-25 DIAGNOSIS — N186 End stage renal disease: Secondary | ICD-10-CM | POA: Insufficient documentation

## 2014-08-25 DIAGNOSIS — K7030 Alcoholic cirrhosis of liver without ascites: Secondary | ICD-10-CM

## 2014-08-25 DIAGNOSIS — I4891 Unspecified atrial fibrillation: Secondary | ICD-10-CM

## 2014-08-25 DIAGNOSIS — K920 Hematemesis: Secondary | ICD-10-CM | POA: Insufficient documentation

## 2014-08-25 LAB — HEPATIC FUNCTION PANEL
ALBUMIN: 1.8 g/dL — AB (ref 3.5–5.0)
ALK PHOS: 125 U/L (ref 38–126)
ALT: 11 U/L — ABNORMAL LOW (ref 17–63)
AST: 47 U/L — AB (ref 15–41)
BILIRUBIN TOTAL: 1 mg/dL (ref 0.3–1.2)
Bilirubin, Direct: 0.3 mg/dL (ref 0.1–0.5)
Indirect Bilirubin: 0.7 mg/dL (ref 0.3–0.9)
Total Protein: 6.1 g/dL — ABNORMAL LOW (ref 6.5–8.1)

## 2014-08-25 LAB — HEPARIN LEVEL (UNFRACTIONATED)
Heparin Unfractionated: 0.44 IU/mL (ref 0.30–0.70)
Heparin Unfractionated: 0.58 IU/mL (ref 0.30–0.70)

## 2014-08-25 LAB — CBC
HEMATOCRIT: 26 % — AB (ref 39.0–52.0)
HEMOGLOBIN: 8.5 g/dL — AB (ref 13.0–17.0)
MCH: 27.4 pg (ref 26.0–34.0)
MCHC: 32.7 g/dL (ref 30.0–36.0)
MCV: 83.9 fL (ref 78.0–100.0)
Platelets: 68 10*3/uL — ABNORMAL LOW (ref 150–400)
RBC: 3.1 MIL/uL — ABNORMAL LOW (ref 4.22–5.81)
RDW: 16.3 % — ABNORMAL HIGH (ref 11.5–15.5)
WBC: 12.3 10*3/uL — ABNORMAL HIGH (ref 4.0–10.5)

## 2014-08-25 LAB — BASIC METABOLIC PANEL
Anion gap: 11 (ref 5–15)
BUN: 22 mg/dL — AB (ref 6–20)
CHLORIDE: 95 mmol/L — AB (ref 101–111)
CO2: 28 mmol/L (ref 22–32)
CREATININE: 4.22 mg/dL — AB (ref 0.61–1.24)
Calcium: 9.6 mg/dL (ref 8.9–10.3)
GFR calc Af Amer: 15 mL/min — ABNORMAL LOW (ref >60)
GFR calc non Af Amer: 13 mL/min — ABNORMAL LOW (ref >60)
GLUCOSE: 143 mg/dL — AB (ref 65–99)
Potassium: 4 mmol/L (ref 3.5–5.1)
SODIUM: 134 mmol/L — AB (ref 135–145)

## 2014-08-25 LAB — GLUCOSE, CAPILLARY
GLUCOSE-CAPILLARY: 129 mg/dL — AB (ref 65–99)
GLUCOSE-CAPILLARY: 135 mg/dL — AB (ref 65–99)
GLUCOSE-CAPILLARY: 135 mg/dL — AB (ref 65–99)
GLUCOSE-CAPILLARY: 142 mg/dL — AB (ref 65–99)
GLUCOSE-CAPILLARY: 35 mg/dL — AB (ref 65–99)
Glucose-Capillary: 111 mg/dL — ABNORMAL HIGH (ref 65–99)
Glucose-Capillary: 102 mg/dL — ABNORMAL HIGH (ref 65–99)

## 2014-08-25 LAB — MAGNESIUM: MAGNESIUM: 2.6 mg/dL — AB (ref 1.7–2.4)

## 2014-08-25 LAB — HEMOGLOBIN A1C
HEMOGLOBIN A1C: 5.3 % (ref 4.8–5.6)
MEAN PLASMA GLUCOSE: 105 mg/dL

## 2014-08-25 LAB — TROPONIN I: Troponin I: 3.21 ng/mL (ref <0.031)

## 2014-08-25 LAB — LIPASE, BLOOD: Lipase: 14 U/L — ABNORMAL LOW (ref 22–51)

## 2014-08-25 LAB — PHOSPHORUS: Phosphorus: 3.6 mg/dL (ref 2.5–4.6)

## 2014-08-25 MED ORDER — ALTEPLASE 2 MG IJ SOLR
2.0000 mg | Freq: Once | INTRAMUSCULAR | Status: DC | PRN
  Filled 2014-08-25: qty 2

## 2014-08-25 MED ORDER — AMIODARONE HCL 200 MG PO TABS
200.0000 mg | ORAL_TABLET | Freq: Two times a day (BID) | ORAL | Status: DC
  Administered 2014-08-26 – 2014-08-27 (×3): 200 mg via ORAL
  Filled 2014-08-25 (×6): qty 1

## 2014-08-25 MED ORDER — AMIODARONE HCL IN DEXTROSE 360-4.14 MG/200ML-% IV SOLN
INTRAVENOUS | Status: AC
  Administered 2014-08-25: 15 mg/h via INTRAVENOUS
  Filled 2014-08-25: qty 200

## 2014-08-25 MED ORDER — SODIUM CHLORIDE 0.9 % IV SOLN: 100.0000 mL | INTRAVENOUS | Status: DC | PRN

## 2014-08-25 MED ORDER — AMIODARONE HCL IN DEXTROSE 360-4.14 MG/200ML-% IV SOLN
15.0000 mg/h | INTRAVENOUS | Status: DC
  Administered 2014-08-25 – 2014-08-26 (×2): 15 mg/h via INTRAVENOUS
  Filled 2014-08-25 (×6): qty 200

## 2014-08-25 MED ORDER — PENTAFLUOROPROP-TETRAFLUOROETH EX AERO: 1.0000 "application " | INHALATION_SPRAY | CUTANEOUS | Status: DC | PRN

## 2014-08-25 MED ORDER — LIDOCAINE-PRILOCAINE 2.5-2.5 % EX CREA
1.0000 "application " | TOPICAL_CREAM | CUTANEOUS | Status: DC | PRN
  Filled 2014-08-25: qty 5

## 2014-08-25 MED ORDER — NEPRO/CARBSTEADY PO LIQD
237.0000 mL | ORAL | Status: DC | PRN
  Filled 2014-08-25: qty 237

## 2014-08-25 MED ORDER — HEPARIN SODIUM (PORCINE) 1000 UNIT/ML DIALYSIS: 1900.0000 [IU] | Freq: Once | INTRAMUSCULAR | Status: DC

## 2014-08-25 MED ORDER — OXYCODONE HCL 5 MG PO TABS
ORAL_TABLET | ORAL | Status: AC
  Filled 2014-08-25: qty 1

## 2014-08-25 MED ORDER — DEXTROSE 5 % IV SOLN
2.0000 g | Freq: Once | INTRAVENOUS | Status: AC
  Administered 2014-08-25: 2 g via INTRAVENOUS
  Filled 2014-08-25 (×2): qty 2

## 2014-08-25 MED ORDER — HEPARIN SODIUM (PORCINE) 1000 UNIT/ML DIALYSIS: 1000.0000 [IU] | INTRAMUSCULAR | Status: DC | PRN

## 2014-08-25 MED ORDER — LIDOCAINE HCL (PF) 1 % IJ SOLN: 5.0000 mL | INTRAMUSCULAR | Status: DC | PRN

## 2014-08-25 NOTE — Progress Notes (Signed)
Pt received a bed on 2W06. Report called to RN. Pt transferred via bed with family members.

## 2014-08-25 NOTE — Progress Notes (Signed)
Patient ID: Greg [name], male   DOB: 06-21-1945, 69 y.o.   MRN: [id number] Patient is comfortable. Had catheter removed earlier today without incident. Tentatively scheduled for new catheter on Wednesday. Does have a triple-lumen catheter for IV therapy in his left internal jugular vein currently. He did undergo plication of his left upper arm AV fistula with Dr. Darrick Penna approximately 3 weeks ago. The incision is healing nicely. It is probably a little too Jalaine Riggenbach to access this. If he has no ongoing signs of sepsis, we'll have a new catheter placed on Wednesday. Discussed this with the patient and his family present

## 2014-08-25 NOTE — Clinical Documentation Improvement (Signed)
  08/24/14 H&P: "Malnourished, well developed." 08/25/14 Progress Note:  "Cachexia" BMI= 21.49  Please specify the medical condition related to the above clinical findings, if any.  Mild Malnutrition Moderate Malnutrition Severe Malnutrition Other_______ Unable to suspect or determine  Thank you!  Sharyn Creamer, BSN, RN [place] [facility]/Clinical Documentation Specialist [email or web address] [phone]

## 2014-08-25 NOTE — Progress Notes (Signed)
ANTICOAGULATION CONSULT NOTE - Follow-up Consult  Pharmacy Consult for heparin Indication: atrial fibrillation  No Known Allergies  Patient Measurements: Weight: 141 lb 5 oz (64.1 kg) Heparin Dosing Weight: 67.1 kg  Vital Signs: Temp: 98.5 F (36.9 C) (08/15 1200) Temp Source: Oral (08/15 1200) BP: 145/50 mmHg (08/15 1200) Pulse Rate: 81 (08/15 1300)  Labs:  Recent Labs  08/23/14 1934  08/24/14 0427 08/24/14 0448 08/24/14 1050 08/25/14 0230 08/25/14 1302  HGB 8.9*  --   --  9.0*  --  8.5*  --   HCT 28.3*  --   --  27.8*  --  26.0*  --   PLT 77*  --   --  68*  --  68*  --   HEPARINUNFRC  --   --   --   --   --  0.44 0.58  CREATININE 3.52*  --   --  3.56*  --  4.22*  --   TROPONINI  --   < > 1.13*  --  2.56* 3.21*  --   < > = values in this interval not displayed.  Estimated Creatinine Clearance: 15.4 mL/min (by C-G formula based on Cr of 4.22).  Assessment: 69 yo male on heparin for afib. Heparin level therapeutic (0.58) on 900 units/hr. Hgb low 8.5 but relatively stable, plt 68 (noted baseline 77). Per dialysis report pt had coffee ground emesis while in dialysis unit.  Goal of Therapy:  Monitor platelets by anticoagulation protocol: Yes   Plan:  - D/C Heparin for now - Monitor Hgb, plt, and s/sx of bleeding  Remi Haggard, PharmD Clinical Pharmacist- Resident Pager: [phone]  08/25/2014,1:57 PM

## 2014-08-25 NOTE — Progress Notes (Addendum)
PULMONARY / CRITICAL CARE MEDICINE   Name: Greg [name] MRN: [id number] DOB: 1945/02/12    ADMISSION DATE:  08/23/2014 CONSULTATION DATE:  08/23/14  REFERRING MD :  Dr Lovell Sheehan  CHIEF COMPLAINT:  Fever N/V  INITIAL PRESENTATION: 69 y/o M admitted 8/14 with weakness, nausea, vomiting and fever.  In ER noted to have AMS, bradycardia and hypotension.  Central line placed and started on norepinephrine.  CBG then checked and patient noted to be hypoglycemic (CBG <30).  BP improved and mental status improved with glucose.  Admitted to ICU for observation.     SIGNIFICANT EVENTS: 8/14  Early am admit with AMS, hypotension, bradycardia  8/14  Afternoon, off pressors  STUDIES:  8/14  ECHO >> 45% to 50%. Severe hypokinesis of the basal-midinferior myocardium.  SUBJECTIVE:  No pressors, vomiting  VITAL SIGNS: Temp:  [97.6 F (36.4 C)-98.5 F (36.9 C)] 98.5 F (36.9 C) (08/15 1200) Pulse Rate:  [50-82] 81 (08/15 1300) Resp:  [14-31] 20 (08/15 1300) BP: (87-165)/(41-102) 145/50 mmHg (08/15 1200) SpO2:  [96 %-100 %] 100 % (08/15 1300) Weight:  [64.1 kg (141 lb 5 oz)-68.2 kg (150 lb 5.7 oz)] 64.1 kg (141 lb 5 oz) (08/15 1050)   HEMODYNAMICS:     VENTILATOR SETTINGS:     INTAKE / OUTPUT:  Intake/Output Summary (Last 24 hours) at 08/25/14 1446 Last data filed at 08/25/14 1050  Gross per 24 hour  Intake 531.85 ml  Output   1867 ml  Net -1335.15 ml    PHYSICAL EXAMINATION: General: no distress Neuro: awake/alert, nonfocal, int confusion CV: s1s2 rrr, no m/r/g PULM CTA  GI: some distention, soft, bs hypo, NT Extremities: warm/dry, multiple scabs on LE's of varying stages, no edema   LABS:  CBC  Recent Labs Lab 08/23/14 1934 08/24/14 0448 08/25/14 0230  WBC 6.0 16.0* 12.3*  HGB 8.9* 9.0* 8.5*  HCT 28.3* 27.8* 26.0*  PLT 77* 68* 68*   Coag's No results for input(s): APTT, INR in the last 168 hours.   BMET  Recent Labs Lab 08/23/14 1934 08/24/14 0448  08/25/14 0230  NA 137 136 134*  K 3.1* 3.7 4.0  CL 96* 99* 95*  CO2 BUN 11 14 22*  CREATININE 3.52* 3.56* 4.22*  GLUCOSE 118* 169* 143*   Electrolytes  Recent Labs Lab 08/23/14 1934 08/23/14 2227 08/24/14 0448 08/25/14 0230  CALCIUM 8.6*  --  8.3* 9.6  MG  --  1.0* 2.1 2.6*  PHOS  --   --  3.2 3.6   Sepsis Markers  Recent Labs Lab 08/24/14 0041 08/24/14 0225 08/24/14 0446  LATICACIDVEN 2.50* 3.7* 2.0  PROCALCITON  --  50.87  --    ABG  Recent Labs Lab 08/24/14 0028  PHART 7.446  PCO2ART 38.7  PO2ART 92.0   Liver Enzymes  Recent Labs Lab 08/23/14 1934 08/24/14 2330  AST 42* 47*  ALT 10* 11*  ALKPHOS 168* 125  BILITOT 0.8 1.0  ALBUMIN 2.1* 1.8*   Cardiac Enzymes  Recent Labs Lab 08/24/14 0427 08/24/14 1050 08/25/14 0230  TROPONINI 1.13* 2.56* 3.21*   Glucose  Recent Labs Lab 08/24/14 1215 08/24/14 1633 08/24/14 2001 08/25/14 0015 08/25/14 0353 08/25/14 1159  GLUCAP 215* 164* 130* 129* 135* 102*    Imaging Dg Abd Portable 1v  08/25/2014   CLINICAL DATA:  Coffee-ground emesis.  Vomiting yesterday and today.  EXAM: PORTABLE ABDOMEN - 1 VIEW  COMPARISON:  Chest radiograph 08/23/2014  FINDINGS: Moderate distension of the  stomach with gas. There is a nonobstructive bowel gas pattern. Moderate amount of stool in the right abdomen. Gas and stool in the rectal region. Vascular calcifications in the aorta and iliac arteries. Limited evaluation for free air on these portable views.  IMPRESSION: Nonobstructive bowel gas pattern.  Moderate distention of the stomach.   Electronically Signed   By: Richarda Overlie M.D.   On: 08/25/2014 12:57     ASSESSMENT / PLAN:  PULMONARY OETT A:  No acute issues P:   Pulmonary hygiene: IS Oxygen as needed to support sats > 92%, on RA  CARDIOVASCULAR L CVC 8/13 >>  A:  Atrial fibrillation - ? New onset Hypertension 160's Coronary artery disease -  on ASA/plavix at home Elevated Troponin - suspect  demand ischemia  P:  Dc heparin drip ASA, coffee ground reported lipitor  Cardiology consulted for AF, brady on amio int, may need to dc drip Not titrating amio, keep tele  RENAL A:   ESRD on HD T/Th/Sat Hypomagnesemia - pt was unable to tolerate full HD session due to increasing weakness.  P:   Nephrology consulted Kd done now holiday after line removal  GASTROINTESTINAL A:   Cirrhosis due to Hepatitis C Ileus secondary to bacteremia P:   Renal / Carb modified diet as tolerated  Kub, needs ngt to drain air if reoccur as he feels better  HEMATOLOGIC A:   Anemia of chronic disease Thrombocytopenia, chronic appearing Gi bleeding? P:  Follow CBC DVT Prophylaxis:  SCD's, no heparin with bleeding>?  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  DDx of right upper chest dialysis, gangrene (appears to be dry gangrene) on his 5th digit on left foot, hx of MSSA osteo P:   BCx2 8/13 >> GNR >>  UC 8/14 >>   Vanco, start date 8/13, day 2/x Fortaz, start date 8/13, day 2/x  Line removed New one wed by vasc if stable Repeat BC in am  Dc vanc  ENDOCRINE A:   Symptomatic Hypoglycemia - on admission DM2  P:   SSI with sensitive   NEUROLOGIC A:   Toxic metabolic encephalopathy 2/2 sepsis, improved s/p therapy for BP / hypoglycemia  P:   Monitor mental status.  RASS goal: 0    FAMILY  - Updates: patient updated at bedside.    - Inter-disciplinary family meet or Palliative Care meeting due by: 8/20   To tele  Mcarthur Rossetti. Tyson Alias, MD, FACP Pgr: [phone] [place] [facility]

## 2014-08-25 NOTE — Progress Notes (Signed)
[facility] Catheter Removal Procedure Note  Diagnosis: ESRD with Functioning AVF/AVGG  Plan:  Remove right diatek catheter  Consent signed:  yes Time out completed:  yes Coumadin:  No. PT/INR (if applicable):   Other labs:   Procedure: 1.  Sterile prepping and draping over catheter area 2. 0 ml 2% lidocaine plain instilled at removal site. 3.  right catheter removed in its entirety with cuff in tact. 4.  Complications: none  5. Tip of catheter sent for culture:  Yes.     Patient tolerated procedure well:  yes Pressure held, no bleeding noted, dressing applied Instructions given to the pt regarding wound care and bleeding.  OtherClinton Gallant [place] 08/25/2014 12:01 PM

## 2014-08-25 NOTE — Progress Notes (Signed)
Subjective:  Seen earlier today. Doing well denies any chest pain or shortness of breath converted to sinus rhythm  Objective:  Vital Signs in the last 24 hours: Temp:  [97.6 F (36.4 C)-98.1 F (36.7 C)] 98.1 F (36.7 C) (08/15 1050) Pulse Rate:  [50-84] 68 (08/15 1050) Resp:  [14-31] 28 (08/15 1050) BP: (87-165)/(41-102) 101/74 mmHg (08/15 1050) SpO2:  [96 %-100 %] 100 % (08/15 1050) Weight:  [64.1 kg (141 lb 5 oz)-68.2 kg (150 lb 5.7 oz)] 64.1 kg (141 lb 5 oz) (08/15 1050)  Intake/Output from previous day: 08/14 0701 - 08/15 0700 In: 1121.9 [P.O.:240; I.V.:831.9; IV Piggyback:50] Out: -  Intake/Output from this shift: Total I/O In: -  Out: 1867 [Other:1867]  Physical Exam: Neck: no adenopathy, no carotid bruit, no JVD and supple, symmetrical, trachea midline Lungs: clear to auscultation bilaterally Heart: regular rate and rhythm, S1, S2 normal and Soft systolic murmur noted Abdomen: soft, non-tender; bowel sounds normal; no masses,  no organomegaly  Lab Results:  Recent Labs  08/24/14 0448 08/25/14 0230  WBC 16.0* 12.3*  HGB 9.0* 8.5*  PLT 68* 68*    Recent Labs  08/24/14 0448 08/25/14 0230  NA 136 134*  K 3.7 4.0  CL 99* 95*  CO2 27 28  GLUCOSE 169* 143*  BUN 14 22*  CREATININE 3.56* 4.22*    Recent Labs  08/24/14 1050 08/25/14 0230  TROPONINI 2.56* 3.21*   Hepatic Function Panel  Recent Labs  08/24/14 2330  PROT 6.1*  ALBUMIN 1.8*  AST 47*  ALT 11*  ALKPHOS 125  BILITOT 1.0  BILIDIR 0.3  IBILI 0.7   No results for input(s): CHOL in the last 72 hours. No results for input(s): PROTIME in the last 72 hours.  Imaging: Imaging results have been reviewed and Dg Chest Portable 1 View  08/24/2014   CLINICAL DATA:  Initial valuation for central line placement.  EXAM: PORTABLE CHEST - 1 VIEW  COMPARISON:  Prior radiograph from earlier the same day.  FINDINGS: There has been interval placement of a left IJ approach central venous catheter with  tip overlying the cavoatrial junction. Right-sided dialysis catheter is unchanged. Cardiomegaly stable. Mediastinal silhouette within normal limits. Atheromatous plaque within the aortic arch.  Lungs are normally inflated. Diffuse pulmonary vascular congestion and indistinctness of the interstitial markings again seen, suggesting mild edema. More patchy and linear right basilar opacity may reflect atelectasis or superimposed infiltrate. No pneumothorax.  IMPRESSION: 1. Interval placement of left IJ approach central venous catheter with tip overlying the cavoatrial junction. 2. Stable cardiomegaly with diffuse pulmonary vascular congestion and indistinctness of the interstitial markings, suggesting mild edema. 3. Superimposed mild patchy and linear right basilar opacity, which may reflect atelectasis or possible infiltrate.   Electronically Signed   By: Rise Mu M.D.   On: 08/24/2014 00:26   Dg Chest Portable 1 View  08/23/2014   CLINICAL DATA:  Code sepsis.  No chest complaints.  EXAM: PORTABLE CHEST - 1 VIEW  COMPARISON:  One-view chest x-ray 08/04/2014.  FINDINGS: The heart is enlarged. A right IJ dialysis catheter is in place. Atherosclerotic calcifications are present at the aortic arch. Slight increased interstitial markings suggests mild edema. No focal airspace consolidation is evident.  IMPRESSION: 1. Cardiomegaly and slight increased interstitial pattern compatible with edema. 2. No focal airspace consolidation to suggest infection. 3. Right IJ Port-A-Cath is stable in position. 4. Atherosclerosis of the thoracic aorta.   Electronically Signed   By: Virl Son.D.  On: 08/23/2014 19:35    Cardiac Studies:  Assessment/Plan:  Acute small non-Q-wave myocardial infarction probably secondary to demand ischemia/hypotension rule out progression of CAD Multivessel CAD history of silent inferoposterior wall MI in the past with occluded RCA and left circumflex as above Gram-negative  rod bacteremia probably catheter related Status post hypoglycemic shock New-onset A. fib flutter with CHADS2 score of 5  Hypertension Diabetes mellitus Peripheral vascular disease status post PTA to left SFA in May 2016 Dry gangrene of fifth left toe with history of MSSA osteomyelitis History of CVA with left paresis History of hepatitis C with cirrhosis of liver End-stage renal disease on hemodialysis Hypercholesteremia Generalised cachexia Anemia of chronic disease  Questionable upper GI bleed post dialysis Plan Continue present management per CCM Consider stopping Plavix if okay with vascular surgery for now Switch IV amiodarone to by mouth Check labs in a.m.  LOS: 2 days    Greg [name] 08/25/2014, 11:39 AM

## 2014-08-25 NOTE — Progress Notes (Signed)
PATIENT ARRIVED TO UNIT 2W FROM 69M VIA LOW AIR LOSS BED. TRANSFERRED TO REGULAR BED BY STAFF. TELE APPLIED. VITALS OBTAINED. ASSESSMENT PERFORMED.  PATIENT INSTRUCTED TO CALL FOR ASSISTANCE WHEN NEEDED. FAMILY AT BEDSIDE.

## 2014-08-25 NOTE — Progress Notes (Signed)
Patient ID: Greg [name], male   DOB: 1945-12-17, 69 y.o.   MRN: [id number]  [facility] Progress Note   Assessment/ Plan:   1. Gram-negative rod/catheter related bacteremia: Currently, on broad-spectrum antibiotic therapy with Elita Quick and vancomycin and will plan for removal of dialysis catheter/catheter holiday with replacement in 48-72 hours. Status post plication of AV fistula. 2. ESRD: Usually on a Tuesday/Thursday/Saturday dialysis schedule and did not get dialysis Saturday-hemodialysis today with catheter holiday ahead 3. Anemia: No overt losses, continue to monitor hemoglobin/hematocrit for PRBC triggers 4. CKD-MBD: Resume phosphorus binders/vitamin D receptor analogues 5. Nutrition: Continue renal diet with oral nutritional supplementation for hypoalbuminemia 6. Hypertension: Blood pressures elevated-now not on pressors  Subjective:   No acute events overnight-mental status improved and understands sequence of events to come. Some nausea earlier but denies other complaints.    Objective:   BP 150/66 mmHg  Pulse 66  Temp(Src) 97.6 F (36.4 C) (Oral)  Resp 20  Wt 68.2 kg (150 lb 5.7 oz)  SpO2 100%  Physical Exam: Gen: Comfortably resting in dialysis CVS: Pulse regular in rate and rhythm, S1 and S2 normal Resp: Clear to auscultation, no rales/rhonchi Abd: Soft, flat, nontender Ext: No lower extremity edema-status post recent LUA aVF plication  Labs: BMET  Recent Labs Lab 08/23/14 1934 08/24/14 0448 08/25/14 0230  NA 137 136 134*  K 3.1* 3.7 4.0  CL 96* 99* 95*  CO2 28 27 28   GLUCOSE 118* 169* 143*  BUN 11 14 22*  CREATININE 3.52* 3.56* 4.22*  CALCIUM 8.6* 8.3* 9.6  PHOS  --  3.2 3.6   CBC  Recent Labs Lab 08/23/14 1934 08/24/14 0448 08/25/14 0230  WBC 6.0 16.0* 12.3*  NEUTROABS 5.8  --   --   HGB 8.9* 9.0* 8.5*  HCT 28.3* 27.8* 26.0*  MCV 86.0 86.6 83.9  PLT 77* 68* 68*   Medications:    . aspirin  81 mg Oral Daily  . atorvastatin   40 mg Oral QHS  . calcium acetate  1,334 mg Oral BID WC  . [START ON 08/26/2014] cefTAZidime (FORTAZ)  IV  2 g Intravenous Q T,Th,Sa-HD  . clopidogrel  75 mg Oral QODAY  . insulin aspart  0-9 Units Subcutaneous 6 times per day  . [START ON 08/26/2014] vancomycin  750 mg Intravenous Q T,Th,Sa-HD   Zetta Bills, MD 08/25/2014, 7:46 AM

## 2014-08-25 NOTE — Procedures (Signed)
Patient seen on Hemodialysis. QB 400, UF goal 2.8L Treatment adjusted as needed.  Zetta Bills MD [facility]. Office # [phone] Pager # [phone] 9:18 AM

## 2014-08-25 NOTE — Progress Notes (Signed)
Per day shift RN; pt received bed on [facility]. However [facility] [place] reports that they do not take pts on IV [place]. [facility] MD called and made aware that pt is unable to transfer to tele on [facility].

## 2014-08-25 NOTE — Progress Notes (Signed)
  Echocardiogram 2D Echocardiogram limited has been performed. Patient was uncooperative throughout the test and ultimately refused in the middle of the study.   Greg [name] 08/25/2014, 4:13 PM

## 2014-08-25 NOTE — Progress Notes (Signed)
Pt projectile vommitted coffee ground colored emesis at the end of hemodialysis; states he feels better; pt cleaned and returned to room. Janna Arch, RN on the floor made aware

## 2014-08-25 NOTE — Progress Notes (Signed)
ANTICOAGULATION CONSULT NOTE - Follow-up Consult  Pharmacy Consult for heparin Indication: atrial fibrillation  No Known Allergies  Patient Measurements: Weight: 147 lb 14.9 oz (67.1 kg) Heparin Dosing Weight: 67.1 kg  Vital Signs: Temp: 97.8 F (36.6 C) (08/15 0016) Temp Source: Oral (08/15 0016) BP: 137/60 mmHg (08/15 0300) Pulse Rate: 64 (08/15 0315)  Labs:  Recent Labs  08/23/14 1934  08/24/14 0427 08/24/14 0448 08/24/14 1050 08/25/14 0230  HGB 8.9*  --   --  9.0*  --  8.5*  HCT 28.3*  --   --  27.8*  --  26.0*  PLT 77*  --   --  68*  --  68*  HEPARINUNFRC  --   --   --   --   --  0.44  CREATININE 3.52*  --   --  3.56*  --  4.22*  TROPONINI  --   < > 1.13*  --  2.56* 3.21*  < > = values in this interval not displayed.  Estimated Creatinine Clearance: 16.1 mL/min (by C-G formula based on Cr of 4.22).  Assessment: 69 yo male on heparin for afib. Heparin level therapeutic (0.44) on 900 units/hr. Hgb low but relatively stable, plt 68 (noted baseline 77). No bleeding noted.  Goal of Therapy:  Heparin level 0.3-0.7 units/ml Monitor platelets by anticoagulation protocol: Yes   Plan:  - Continue heparin at 900 units/hr - F/u 6 hr heparin level to confirm therapeutic - daily heparin level and CBC  Christoper Fabian, PharmD, BCPS Clinical pharmacist, pager [phone] 08/25/2014,3:15 AM

## 2014-08-26 ENCOUNTER — Inpatient Hospital Stay (HOSPITAL_COMMUNITY): Payer: Medicare Other

## 2014-08-26 DIAGNOSIS — R4182 Altered mental status, unspecified: Secondary | ICD-10-CM

## 2014-08-26 LAB — CULTURE, BLOOD (ROUTINE X 2)

## 2014-08-26 LAB — GLUCOSE, CAPILLARY
GLUCOSE-CAPILLARY: 106 mg/dL — AB (ref 65–99)
GLUCOSE-CAPILLARY: 93 mg/dL (ref 65–99)
GLUCOSE-CAPILLARY: 94 mg/dL (ref 65–99)
GLUCOSE-CAPILLARY: 107 mg/dL — AB (ref 65–99)
GLUCOSE-CAPILLARY: 102 mg/dL — AB (ref 65–99)

## 2014-08-26 LAB — URINE CULTURE

## 2014-08-26 LAB — CBC WITH DIFFERENTIAL/PLATELET
BASOS ABS: 0 10*3/uL (ref 0.0–0.1)
BASOS PCT: 0 % (ref 0–1)
EOS ABS: 0 10*3/uL (ref 0.0–0.7)
EOS PCT: 0 % (ref 0–5)
HCT: 25.1 % — ABNORMAL LOW (ref 39.0–52.0)
HEMOGLOBIN: 8.3 g/dL — AB (ref 13.0–17.0)
Lymphocytes Relative: 11 % — ABNORMAL LOW (ref 12–46)
Lymphs Abs: 0.8 10*3/uL (ref 0.7–4.0)
MCH: 28.1 pg (ref 26.0–34.0)
MCHC: 33.1 g/dL (ref 30.0–36.0)
MCV: 85.1 fL (ref 78.0–100.0)
Monocytes Absolute: 0.8 10*3/uL (ref 0.1–1.0)
Monocytes Relative: 10 % (ref 3–12)
NEUTROS PCT: 80 % — AB (ref 43–77)
Neutro Abs: 6.3 10*3/uL (ref 1.7–7.7)
PLATELETS: 82 10*3/uL — AB (ref 150–400)
RBC: 2.95 MIL/uL — AB (ref 4.22–5.81)
RDW: 16.2 % — ABNORMAL HIGH (ref 11.5–15.5)
WBC: 8 10*3/uL (ref 4.0–10.5)

## 2014-08-26 LAB — BASIC METABOLIC PANEL
Anion gap: 10 (ref 5–15)
BUN: 20 mg/dL (ref 6–20)
CHLORIDE: 99 mmol/L — AB (ref 101–111)
CO2: 28 mmol/L (ref 22–32)
CREATININE: 3.47 mg/dL — AB (ref 0.61–1.24)
Calcium: 9.3 mg/dL (ref 8.9–10.3)
GFR, EST AFRICAN AMERICAN: 20 mL/min — AB (ref >60)
GFR, EST NON AFRICAN AMERICAN: 17 mL/min — AB (ref >60)
Glucose, Bld: 107 mg/dL — ABNORMAL HIGH (ref 65–99)
Potassium: 3.7 mmol/L (ref 3.5–5.1)
SODIUM: 137 mmol/L (ref 135–145)

## 2014-08-26 LAB — TROPONIN I: TROPONIN I: 2.2 ng/mL — AB (ref <0.031)

## 2014-08-26 MED ORDER — LANTHANUM CARBONATE 500 MG PO CHEW
500.0000 mg | CHEWABLE_TABLET | Freq: Three times a day (TID) | ORAL | Status: DC
  Administered 2014-08-26 – 2014-08-27 (×3): 500 mg via ORAL
  Filled 2014-08-26 (×14): qty 1

## 2014-08-26 MED ORDER — SODIUM CHLORIDE 0.9 % IV SOLN
500.0000 mg | Freq: Once | INTRAVENOUS | Status: AC
  Administered 2014-08-26: 500 mg via INTRAVENOUS
  Filled 2014-08-26 (×2): qty 500

## 2014-08-26 MED ORDER — SODIUM CHLORIDE 0.9 % IV SOLN
250.0000 mg | Freq: Two times a day (BID) | INTRAVENOUS | Status: DC
  Administered 2014-08-27: 250 mg via INTRAVENOUS
  Filled 2014-08-26 (×4): qty 250

## 2014-08-26 NOTE — Care Management Important Message (Signed)
Important Message  Patient Details  Name: Johnney Kirchoff MRN: [id number] Date of Birth: 25-May-1945   Medicare Important Message Given:  Yes-second notification given    Kyla Balzarine 08/26/2014, 10:27 AMImportant Message  Patient Details  Name: Assad Muhammed MRN: [id number] Date of Birth: 07-18-1945   Medicare Important Message Given:  Yes-second notification given    Kyla Balzarine 08/26/2014, 10:26 AM

## 2014-08-26 NOTE — Progress Notes (Signed)
EEG completed; results pending.    

## 2014-08-26 NOTE — Progress Notes (Signed)
ANTIBIOTIC CONSULT NOTE - INITIAL  Pharmacy Consult for Primaxin Indication: ESBL+ bacteremia and UTI  No Known Allergies  Patient Measurements: Weight: 147 lb 12.8 oz (67.042 kg) Adjusted Body Weight:    Vital Signs: Temp: 97.6 F (36.4 C) (08/16 0457) Temp Source: Oral (08/16 0457) BP: 141/60 mmHg (08/16 0457) Pulse Rate: 70 (08/16 0457) Intake/Output from previous day: 08/15 0701 - 08/16 0700 In: 506.2 [P.O.:250; I.V.:256.2] Out: 1867  Intake/Output from this shift:    Labs:  Recent Labs  08/24/14 0448 08/25/14 0230 08/26/14 0517  WBC 16.0* 12.3* 8.0  HGB 9.0* 8.5* 8.3*  PLT 68* 68* 82*  CREATININE 3.56* 4.22* 3.47*   Estimated Creatinine Clearance: 19.6 mL/min (by C-G formula based on Cr of 3.47). No results for input(s): VANCOTROUGH, VANCOPEAK, VANCORANDOM, GENTTROUGH, GENTPEAK, GENTRANDOM, TOBRATROUGH, TOBRAPEAK, TOBRARND, AMIKACINPEAK, AMIKACINTROU, AMIKACIN in the last 72 hours.   Microbiology: Recent Results (from the past 720 hour(s))  Culture, blood (routine x 2)     Status: None   Collection Time: 08/23/14  7:36 PM  Result Value Ref Range Status   Specimen Description BLOOD RIGHT ANTECUBITAL  Final   Special Requests BOTTLES DRAWN AEROBIC AND ANAEROBIC 5CC   Final   Culture  Setup Time   Final    GRAM NEGATIVE RODS IN BOTH AEROBIC AND ANAEROBIC BOTTLES CRITICAL RESULT CALLED TO, READ BACK BY AND VERIFIED WITH: P CLAUDIO 08/24/14 @1035  M VESTAL    Culture   Final    ESCHERICHIA COLI Confirmed Extended Spectrum Beta-Lactamase Producer (ESBL)    Report Status 08/26/2014 FINAL  Final   Organism ID, Bacteria ESCHERICHIA COLI  Final      Susceptibility   Escherichia coli - MIC*    AMPICILLIN >=32 RESISTANT Resistant     CEFAZOLIN >=64 RESISTANT Resistant     CEFEPIME 8 RESISTANT Resistant     CEFTAZIDIME 16 RESISTANT Resistant     CEFTRIAXONE >=64 RESISTANT Resistant     CIPROFLOXACIN >=4 RESISTANT Resistant     GENTAMICIN >=16 RESISTANT Resistant      IMIPENEM <=0.25 SENSITIVE Sensitive     TRIMETH/SULFA >=320 RESISTANT Resistant     AMPICILLIN/SULBACTAM >=32 RESISTANT Resistant     PIP/TAZO 8 SENSITIVE Sensitive     * ESCHERICHIA COLI  Culture, blood (routine x 2)     Status: None   Collection Time: 08/23/14  8:04 PM  Result Value Ref Range Status   Specimen Description BLOOD RIGHT HAND  Final   Special Requests BOTTLES DRAWN AEROBIC AND ANAEROBIC 4CC   Final   Culture  Setup Time   Final    GRAM NEGATIVE RODS CRITICAL RESULT CALLED TO, READ BACK BY AND VERIFIED WITH: T PHILLIPS 08/24/14 @ 1208 M VESTAL IN BOTH AEROBIC AND ANAEROBIC BOTTLES CONFIRMED BY K WOOTEN    Culture   Final    ESCHERICHIA COLI SUSCEPTIBILITIES PERFORMED ON PREVIOUS CULTURE WITHIN THE LAST 5 DAYS.    Report Status 08/26/2014 FINAL  Final  Urine culture     Status: None   Collection Time: 08/23/14  9:13 PM  Result Value Ref Range Status   Specimen Description URINE, CATHETERIZED  Final   Special Requests NONE  Final   Culture   Final    >=100,000 COLONIES/mL ESCHERICHIA COLI Confirmed Extended Spectrum Beta-Lactamase Producer (ESBL)    Report Status 08/26/2014 FINAL  Final   Organism ID, Bacteria ESCHERICHIA COLI  Final      Susceptibility   Escherichia coli - MIC*    AMPICILLIN >=32  RESISTANT Resistant     CEFAZOLIN >=64 RESISTANT Resistant     CEFTRIAXONE >=64 RESISTANT Resistant     CIPROFLOXACIN >=4 RESISTANT Resistant     GENTAMICIN >=16 RESISTANT Resistant     IMIPENEM <=0.25 SENSITIVE Sensitive     NITROFURANTOIN <=16 SENSITIVE Sensitive     TRIMETH/SULFA >=320 RESISTANT Resistant     AMPICILLIN/SULBACTAM >=32 RESISTANT Resistant     PIP/TAZO 8 SENSITIVE Sensitive     * >=100,000 COLONIES/mL ESCHERICHIA COLI  MRSA PCR Screening     Status: None   Collection Time: 08/24/14  2:26 AM  Result Value Ref Range Status   MRSA by PCR NEGATIVE NEGATIVE Final    Comment:        The GeneXpert MRSA Assay (FDA approved for NASAL  specimens only), is one component of a comprehensive MRSA colonization surveillance program. It is not intended to diagnose MRSA infection nor to guide or monitor treatment for MRSA infections.     Medical History: Past Medical History  Diagnosis Date  . ESRD on hemodialysis     Started HD around 2006.   Marland Kitchen Cirrhosis   . Alcohol abuse   . Stroke     left sided deficits  . Hypertension   . Diabetes mellitus     type 2   . Hepatitis     hep c  . Constipation   . 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 Merilynn Finland, PharmD, BCPS Clinical Staff Pharmacist Pager [phone]  Misty Stanley Stillinger 08/26/2014,11:32 AM

## 2014-08-26 NOTE — Procedures (Signed)
ELECTROENCEPHALOGRAM REPORT   Patient: Greg [name]       Room #: 2W-06 Age: 69 y.o.        Sex: male Referring Physician: Dr Isidoro Donning Report Date:  08/26/2014        Interpreting Physician: Omelia Blackwater  History: [name] is an 69 y.o. male admitted with altered mental status, found to have bacteremia and UTI  Medications:  Scheduled: . amiodarone  200 mg Oral BID  . atorvastatin  40 mg Oral QHS  . clopidogrel  75 mg Oral QODAY  . [START ON 08/27/2014] imipenem-cilastatin  250 mg Intravenous Q12H  . insulin aspart  0-9 Units Subcutaneous 6 times per day  . lanthanum  500 mg Oral TID WC    Conditions of Recording:  This is a 16 channel EEG carried out with the patient in the drowsy state.  Description:  The waking background activity consists of a low voltage, symmetrical, poorly organized, mix of theta and delta activity. No focal slowing or epileptiform activity is noted.   Hyperventilation was not performed.  Intermittent photic stimulation was not performed.   IMPRESSION: This is an abnormal EEG secondary to general background slowing.  This finding may be seen with a diffuse disturbance that is etiologically nonspecific, but may include a metabolic encephalopathy, among other possibilities.  No epileptiform activity was noted.     Elspeth Cho, DO [facility] [phone]  If 7pm- 7am, please page neurology on call as listed in AMION. 08/26/2014, 12:50 PM

## 2014-08-26 NOTE — Progress Notes (Signed)
[facility]                                                                              Patient Demographics  Greg [name], is a 69 y.o. male, DOB - [birth year], RUE:[id number]  Admit date - 08/23/2014   Admitting Physician Nyoka Cowden, MD  Outpatient Primary MD for the patient is Rinaldo Cloud, MD  LOS - 3   Chief Complaint  Patient presents with  . Fever       Brief HPI   INITIAL PRESENTATION: 69 y/o M admitted 8/14 with weakness, nausea, vomiting and fever. In ER noted to have AMS, bradycardia and hypotension. Central line placed and started on norepinephrine. CBG then checked and patient noted to be hypoglycemic (CBG <30). BP improved and mental status improved with glucose. Admitted to ICU. Patient was placed on pressors briefly for sepsis on admission. Blood cultures showed Escherichia coli gram-negative rods, patient also has dry gangrene of the left fourth and fifth digit. Blood cultures sensitive only to imipenem and Zosyn. Patient was initially placed on vancomycin and Fortaz in ICU. Patient was transferred out to [facility] hospitalist service, care assumed on 8/16  Assessment & Plan    Principal Problem:   ESBL Escherichia coli Sepsis/catheter related bacteremia with hypotension. Also has dry gangrene on his fourth and fifth digit of the left toe, prior history of MSSA osteomyelitis.  - Vancomycin was discontinued by CCM, discontinue Fortaz, placed on imipenem per sensitivities - TDC removed by vascular surgery, plans for new catheter placement tomorrow - Repeat blood cultures today  Active Problems:   Essential hypertension -Patient had a brief hypotension due to sepsis requiring vasopressors, now stable    ESRD on hemodialysis TTS - Nephrology following, dialysis catheter removed yesterday due to bacteremia, patient had hemodialysis done yesterday - Patient will have new catheter placement planned tomorrow  Anemia, thrombocytopenia - Continue to  monitor H&H currently stable  Elevated troponin with new onset atrial fibrillation - Cardiology following, Dr. Sharyn Lull, recommended medical management from cardiac point of view. - Recommended anticoagulation in view of atrial fibrillation, CHADS vasc score 5 once cleared by vascular surgery and renal - Plavix, amiodarone per cardiology - 2-D echo showed EF of 45-50% with severe hypokinesis of the basal and mid inferior myocardium  Diabetes mellitus - Blood sugars controlled, continue sliding scale insulin  Diabetic foot infection/left toe infection - Continue imipenem, dry gangrene, monitor closely  Severe deconditioning - Discussed in detail with patient's to sister-in-law's (he lives in their house) who reported that patient has not been ambulating, most of the time sits on the couch, needs assistance with feeding etc.  -Placed PT evaluation, may need rehabilitation.   History of hepatitis C, cirrhosis  Toxic metabolic encephalopathy - Likely due to sepsis, bacteremia, hypoglycemia improving follow EEG,   Code Status: Full code   Family Communication: Discussed in detail with the patient, all imaging results, lab results explained to the patient and relatives  Disposition Plan: Not medically ready   Time Spent in minutes    Procedures 2-D echo  Consults   patient was admitted by critical care service Cardiology  Nephrology Vascular surgery  DVT Prophylaxis  SCD's  Medications  Scheduled Meds: . amiodarone  200 mg Oral BID  . atorvastatin  40 mg Oral QHS  . clopidogrel  75 mg Oral QODAY  . insulin aspart  0-9 Units Subcutaneous 6 times per day  . lanthanum  500 mg Oral TID WC   Continuous Infusions: . amiodarone 15 mg/hr (08/25/14 1930)   PRN Meds:.sodium chloride, acetaminophen **OR** acetaminophen, alum & mag hydroxide-simeth, ondansetron **OR** ondansetron (ZOFRAN) IV, oxyCODONE   Antibiotics   Anti-infectives    Start     Dose/Rate Route  Frequency Ordered Stop   08/26/14 1200  vancomycin (VANCOCIN) IVPB 750 mg/150 ml premix  Status:  Discontinued     750 mg 150 mL/hr over 60 Minutes Intravenous Every T-Th-Sa (Hemodialysis) 08/24/14 1114 08/25/14 1456   08/26/14 1200  cefTAZidime (FORTAZ) 2 g in dextrose 5 % 50 mL IVPB  Status:  Discontinued     2 g 100 mL/hr over 30 Minutes Intravenous Every T-Th-Sa (Hemodialysis) 08/24/14 1114 08/25/14 1650   08/25/14 1730  cefTAZidime (FORTAZ) 2 g in dextrose 5 % 50 mL IVPB     2 g 100 mL/hr over 30 Minutes Intravenous  Once 08/25/14 1650 08/25/14 1837   08/23/14 2100  vancomycin (VANCOCIN) 1,500 mg in sodium chloride 0.9 % 500 mL IVPB     1,500 mg 250 mL/hr over 120 Minutes Intravenous  Once 08/23/14 1946 08/23/14 2317   08/23/14 2100  cefTAZidime (FORTAZ) 2 g in dextrose 5 % 50 mL IVPB     2 g 100 mL/hr over 30 Minutes Intravenous  Once 08/23/14 1946 08/23/14 2245        Subjective:   Greg [name] was seen and examined today. Patient denies dizziness, chest pain, shortness of breath, abdominal pain, N/V/D/C, new weakness, numbess, tingling. No acute events overnight.   very weak, still somewhat confused   Objective:   Blood pressure 141/60, pulse 70, temperature 97.6 F (36.4 C), temperature source Oral, resp. rate 18, weight 67.042 kg (147 lb 12.8 oz), SpO2 100 %.  Wt Readings from Last 3 Encounters:  08/26/14 67.042 kg (147 lb 12.8 oz)  08/04/14 77.111 kg (170 lb)  07/24/14 77.111 kg (170 lb)     Intake/Output Summary (Last 24 hours) at 08/26/14 1108 Last data filed at 08/25/14 2100  Gross per 24 hour  Intake    433 ml  Output      0 ml  Net    433 ml    Exam  General: Alert and oriented x 2, NAD  HEENT:  PERRLA, EOMI, Anicteric Sclera, mucous membranes moist.   Neck: Supple, no JVD, no masses  CVS: S1 S2 auscultated, no rubs, murmurs or gallops. Regular rate and rhythm.  Respiratory: Clear to auscultation bilaterally, no wheezing, rales or  rhonchi  Abdomen: Soft, nontender, mild distended, + bowel sounds  Ext: no cyanosis clubbing or edema  Neuro: AAOx2, Cr N's II- XII intact, strength lower extremity 4/5 bilaterally  Skin: No rashes  Psych: Normal affect and demeanor, alert and oriented x2   Data Review   Micro Results Recent Results (from the past 240 hour(s))  Culture, blood (routine x 2)     Status: None   Collection Time: 08/23/14  7:36 PM  Result Value Ref Range Status   Specimen Description BLOOD RIGHT ANTECUBITAL  Final   Special Requests BOTTLES DRAWN AEROBIC AND ANAEROBIC 5CC   Final   Culture  Setup Time   Final  GRAM NEGATIVE RODS IN BOTH AEROBIC AND ANAEROBIC BOTTLES CRITICAL RESULT CALLED TO, READ BACK BY AND VERIFIED WITH: P CLAUDIO 08/24/14 @1035  M VESTAL    Culture   Final    ESCHERICHIA COLI Confirmed Extended Spectrum Beta-Lactamase Producer (ESBL)    Report Status 08/26/2014 FINAL  Final   Organism ID, Bacteria ESCHERICHIA COLI  Final      Susceptibility   Escherichia coli - MIC*    AMPICILLIN >=32 RESISTANT Resistant     CEFAZOLIN >=64 RESISTANT Resistant     CEFEPIME 8 RESISTANT Resistant     CEFTAZIDIME 16 RESISTANT Resistant     CEFTRIAXONE >=64 RESISTANT Resistant     CIPROFLOXACIN >=4 RESISTANT Resistant     GENTAMICIN >=16 RESISTANT Resistant     IMIPENEM <=0.25 SENSITIVE Sensitive     TRIMETH/SULFA >=320 RESISTANT Resistant     AMPICILLIN/SULBACTAM >=32 RESISTANT Resistant     PIP/TAZO 8 SENSITIVE Sensitive     * ESCHERICHIA COLI  Culture, blood (routine x 2)     Status: None   Collection Time: 08/23/14  8:04 PM  Result Value Ref Range Status   Specimen Description BLOOD RIGHT HAND  Final   Special Requests BOTTLES DRAWN AEROBIC AND ANAEROBIC 4CC   Final   Culture  Setup Time   Final    GRAM NEGATIVE RODS CRITICAL RESULT CALLED TO, READ BACK BY AND VERIFIED WITH: T PHILLIPS 08/24/14 @ 1208 M VESTAL IN BOTH AEROBIC AND ANAEROBIC BOTTLES CONFIRMED BY K WOOTEN     Culture   Final    ESCHERICHIA COLI SUSCEPTIBILITIES PERFORMED ON PREVIOUS CULTURE WITHIN THE LAST 5 DAYS.    Report Status 08/26/2014 FINAL  Final  Urine culture     Status: None   Collection Time: 08/23/14  9:13 PM  Result Value Ref Range Status   Specimen Description URINE, CATHETERIZED  Final   Special Requests NONE  Final   Culture   Final    >=100,000 COLONIES/mL ESCHERICHIA COLI Confirmed Extended Spectrum Beta-Lactamase Producer (ESBL)    Report Status 08/26/2014 FINAL  Final   Organism ID, Bacteria ESCHERICHIA COLI  Final      Susceptibility   Escherichia coli - MIC*    AMPICILLIN >=32 RESISTANT Resistant     CEFAZOLIN >=64 RESISTANT Resistant     CEFTRIAXONE >=64 RESISTANT Resistant     CIPROFLOXACIN >=4 RESISTANT Resistant     GENTAMICIN >=16 RESISTANT Resistant     IMIPENEM <=0.25 SENSITIVE Sensitive     NITROFURANTOIN <=16 SENSITIVE Sensitive     TRIMETH/SULFA >=320 RESISTANT Resistant     AMPICILLIN/SULBACTAM >=32 RESISTANT Resistant     PIP/TAZO 8 SENSITIVE Sensitive     * >=100,000 COLONIES/mL ESCHERICHIA COLI  MRSA PCR Screening     Status: None   Collection Time: 08/24/14  2:26 AM  Result Value Ref Range Status   MRSA by PCR NEGATIVE NEGATIVE Final    Comment:        The GeneXpert MRSA Assay (FDA approved for NASAL specimens only), is one component of a comprehensive MRSA colonization surveillance program. It is not intended to diagnose MRSA infection nor to guide or monitor treatment for MRSA infections.     Radiology Reports Dg Chest Portable 1 View  08/24/2014   CLINICAL DATA:  Initial valuation for central line placement.  EXAM: PORTABLE CHEST - 1 VIEW  COMPARISON:  Prior radiograph from earlier the same day.  FINDINGS: There has been interval placement of a left IJ approach central venous catheter  with tip overlying the cavoatrial junction. Right-sided dialysis catheter is unchanged. Cardiomegaly stable. Mediastinal silhouette within normal  limits. Atheromatous plaque within the aortic arch.  Lungs are normally inflated. Diffuse pulmonary vascular congestion and indistinctness of the interstitial markings again seen, suggesting mild edema. More patchy and linear right basilar opacity may reflect atelectasis or superimposed infiltrate. No pneumothorax.  IMPRESSION: 1. Interval placement of left IJ approach central venous catheter with tip overlying the cavoatrial junction. 2. Stable cardiomegaly with diffuse pulmonary vascular congestion and indistinctness of the interstitial markings, suggesting mild edema. 3. Superimposed mild patchy and linear right basilar opacity, which may reflect atelectasis or possible infiltrate.   Electronically Signed   By: Rise Mu M.D.   On: 08/24/2014 00:26   Dg Chest Portable 1 View  08/23/2014   CLINICAL DATA:  Code sepsis.  No chest complaints.  EXAM: PORTABLE CHEST - 1 VIEW  COMPARISON:  One-view chest x-ray 08/04/2014.  FINDINGS: The heart is enlarged. A right IJ dialysis catheter is in place. Atherosclerotic calcifications are present at the aortic arch. Slight increased interstitial markings suggests mild edema. No focal airspace consolidation is evident.  IMPRESSION: 1. Cardiomegaly and slight increased interstitial pattern compatible with edema. 2. No focal airspace consolidation to suggest infection. 3. Right IJ Port-A-Cath is stable in position. 4. Atherosclerosis of the thoracic aorta.   Electronically Signed   By: Marin Roberts M.D.   On: 08/23/2014 19:35   Dg Chest Port 1 View  08/04/2014   CLINICAL DATA:  Dialysis catheter insertion  EXAM: PORTABLE CHEST - 1 VIEW  COMPARISON:  07/19/2014  FINDINGS: Right jugular dual-lumen catheter is been placed with tips in the SVC and right atrium. No pneumothorax  Cardiac enlargement without heart failure or edema. Mild bibasilar atelectasis. Negative for pneumonia.  IMPRESSION: Satisfactory dual-lumen catheter placement in the right atrium. No  pneumothorax  Mild atelectasis in the lung bases.   Electronically Signed   By: Marlan Palau M.D.   On: 08/04/2014 10:51   Dg Abd Portable 1v  08/25/2014   CLINICAL DATA:  Coffee-ground emesis.  Vomiting yesterday and today.  EXAM: PORTABLE ABDOMEN - 1 VIEW  COMPARISON:  Chest radiograph 08/23/2014  FINDINGS: Moderate distension of the stomach with gas. There is a nonobstructive bowel gas pattern. Moderate amount of stool in the right abdomen. Gas and stool in the rectal region. Vascular calcifications in the aorta and iliac arteries. Limited evaluation for free air on these portable views.  IMPRESSION: Nonobstructive bowel gas pattern.  Moderate distention of the stomach.   Electronically Signed   By: Richarda Overlie M.D.   On: 08/25/2014 12:57   Dg Fluoro Guide Cv Line-no Report  08/04/2014   CLINICAL DATA:    FLOURO GUIDE CV LINE  Fluoroscopy was utilized by the requesting physician.  No radiographic  interpretation.     CBC  Recent Labs Lab 08/23/14 1934 08/24/14 0448 08/25/14 0230 08/26/14 0517  WBC 6.0 16.0* 12.3* 8.0  HGB 8.9* 9.0* 8.5* 8.3*  HCT 28.3* 27.8* 26.0* 25.1*  PLT 77* 68* 68* 82*  MCV 86.0 86.6 83.9 85.1  MCH 27.1 28.0 27.4 28.1  MCHC 31.4 32.4 32.7 33.1  RDW 16.2* 16.5* 16.3* 16.2*  LYMPHSABS 0.1*  --   --  0.8  MONOABS 0.0*  --   --  0.8  EOSABS 0.0  --   --  0.0  BASOSABS 0.0  --   --  0.0    Chemistries   Recent Labs Lab  08/23/14 1934 08/23/14 2227 08/24/14 0448 08/24/14 2330 08/25/14 0230 08/26/14 0517  NA 137  --  136  --  134* 137  K 3.1*  --  3.7  --  4.0 3.7  CL 96*  --  99*  --  95* 99*  CO2 28  --  27  --  28 28  GLUCOSE 118*  --  169*  --  143* 107*  BUN 11  --  14  --  22* 20  CREATININE 3.52*  --  3.56*  --  4.22* 3.47*  CALCIUM 8.6*  --  8.3*  --  9.6 9.3  MG  --  1.0* 2.1  --  2.6*  --   AST 42*  --   --  47*  --   --   ALT 10*  --   --  11*  --   --   ALKPHOS 168*  --   --  125  --   --   BILITOT 0.8  --   --  1.0  --   --     ------------------------------------------------------------------------------------------------------------------ estimated creatinine clearance is 19.6 mL/min (by C-G formula based on Cr of 3.47). ------------------------------------------------------------------------------------------------------------------  Recent Labs  08/24/14 1229  HGBA1C 5.3   ------------------------------------------------------------------------------------------------------------------ No results for input(s): CHOL, HDL, LDLCALC, TRIG, CHOLHDL, LDLDIRECT in the last 72 hours. ------------------------------------------------------------------------------------------------------------------ No results for input(s): TSH, T4TOTAL, T3FREE, THYROIDAB in the last 72 hours.  Invalid input(s): FREET3 ------------------------------------------------------------------------------------------------------------------  Recent Labs  08/24/14 0448 08/24/14 0500  VITAMINB12 1127*  --   FOLATE  --  3.7*  FERRITIN 259  --   TIBC 175*  --   IRON 7*  --   RETICCTPCT 1.7  --     Coagulation profile No results for input(s): INR, PROTIME in the last 168 hours.  No results for input(s): DDIMER in the last 72 hours.  Cardiac Enzymes  Recent Labs Lab 08/24/14 1050 08/25/14 0230 08/26/14 0517  TROPONINI 2.56* 3.21* 2.20*   ------------------------------------------------------------------------------------------------------------------ Invalid input(s): POCBNP   Recent Labs  08/25/14 0353 08/25/14 1159 08/25/14 1844 08/25/14 1959 08/26/14 0559 08/26/14 0802  GLUCAP 135* 102* 142* 135* 106* 93     Laaibah Wartman M.D. [facility] 08/26/2014, 11:08 AM  Pager: [phone] Between 7am to 7pm - call Pager - [phone]  After 7pm go to [email or web address] - password [email or web address]  Call night coverage person covering after 7pm

## 2014-08-26 NOTE — Progress Notes (Signed)
[facility]  Subjective  - Patient comfortable.   Objective 141/60 70 97.6 F (36.4 C) (Oral) 18 100%  Intake/Output Summary (Last 24 hours) at 08/26/14 0737 Last data filed at 08/25/14 2100  Gross per 24 hour  Intake  506.2 ml  Output   1867 ml  Net -1360.8 ml    Right IJ removal site dressing is clean and dry Left fistula palpable thrill, incision healing well   Assessment/Planning: 3 weeks s/p revision of left AV fistula 1 day after removal of right IJ catheter Plan for new tunnel catheter tomorrow Dr. Hart [place] WBC 8.0 today, plt improved 82 K/ul    Clinton Gallant [facility] 08/26/2014 7:37 AM --  Laboratory Lab Results:  Recent Labs  08/25/14 0230 08/26/14 0517  WBC 12.3* 8.0  HGB 8.5* 8.3*  HCT 26.0* 25.1*  PLT 68* 82*   BMET  Recent Labs  08/25/14 0230 08/26/14 0517  NA 134* 137  K 4.0 3.7  CL 95* 99*  CO2 28 28  GLUCOSE 143* 107*  BUN 22* 20  CREATININE 4.22* 3.47*  CALCIUM 9.6 9.3    COAG Lab Results  Component Value Date   INR 1.19 07/19/2014   INR 1.08 08/11/2010   No results found for: PTT

## 2014-08-26 NOTE — Progress Notes (Signed)
Patient ID: Greg [name], male   DOB: 1945-11-04, 69 y.o.   MRN: [id number]  [facility] Progress Note   Assessment/ Plan:   1. Gram-negative rod/catheter related bacteremia: On Fortaz--appreciate help from [facility] in removing [facility] for catheter holiday yesterday and plans in place for new [facility] placement tomorrow. Status post plication of AV fistula that is currently not suitable for cannulation (still healing). 2. ESRD: Usually on a Tuesday/Thursday/Saturday dialysis schedule and HD done yesterday for catheter holiday---new HD [facility] tomorrow 3. Anemia: No overt losses, continue to monitor hemoglobin/hematocrit for PRBC triggers 4. CKD-MBD: Resumed phosphorus binders/vitamin D receptor analogues 5. Nutrition: Continue renal diet with oral nutritional supplementation for hypoalbuminemia 6. Hypertension: Blood pressures elevated-now not on pressors  Subjective:   Reports to be doing well and denies any complaints at this time    Objective:   BP 141/60 mmHg  Pulse 70  Temp(Src) 97.6 F (36.4 C) (Oral)  Resp 18  Wt 67.042 kg (147 lb 12.8 oz)  SpO2 100%  Physical Exam: Gen: Comfortably resting in dialysis CVS: Pulse regular in rate and rhythm, S1 and S2 normal Resp: Clear to auscultation, no rales/rhonchi Abd: Soft, flat, nontender Ext: No lower extremity edema-status post recent LUA aVF plication  Labs: BMET  Recent Labs Lab 08/23/14 1934 08/24/14 0448 08/25/14 0230 08/26/14 0517  NA 137 136 134* 137  K 3.1* 3.7 4.0 3.7  CL 96* 99* 95* 99*  CO2 28 27 28 28   GLUCOSE 118* 169* 143* 107*  BUN 11 14 22* 20  CREATININE 3.52* 3.56* 4.22* 3.47*  CALCIUM 8.6* 8.3* 9.6 9.3  PHOS  --  3.2 3.6  --    CBC  Recent Labs Lab 08/23/14 1934 08/24/14 0448 08/25/14 0230 08/26/14 0517  WBC 6.0 16.0* 12.3* 8.0  NEUTROABS 5.8  --   --  6.3  HGB 8.9* 9.0* 8.5* 8.3*  HCT 28.3* 27.8* 26.0* 25.1*  MCV 86.0 86.6 83.9 85.1  PLT 77* 68* 68* 82*   Medications:    . amiodarone   200 mg Oral BID  . atorvastatin  40 mg Oral QHS  . calcium acetate  1,334 mg Oral BID WC  . clopidogrel  75 mg Oral QODAY  . insulin aspart  0-9 Units Subcutaneous 6 times per day   Zetta Bills, MD 08/26/2014, 7:56 AM

## 2014-08-26 NOTE — Progress Notes (Signed)
Subjective:  Events of yesterday noted. No active cardiac issues at this point. Troponin I are trending down. Patient remains in sinus rhythm. Heparin was DC'd yesterday due to coffee-ground emesis  Objective:  Vital Signs in the last 24 hours: Temp:  [97.6 F (36.4 C)-98.8 F (37.1 C)] 97.6 F (36.4 C) (08/16 0457) Pulse Rate:  [66-82] 70 (08/16 0457) Resp:  [14-28] 18 (08/16 0457) BP: (89-180)/(50-104) 141/60 mmHg (08/16 0457) SpO2:  [98 %-100 %] 100 % (08/16 0457) Weight:  [64.1 kg (141 lb 5 oz)-67.042 kg (147 lb 12.8 oz)] 67.042 kg (147 lb 12.8 oz) (08/16 0457)  Intake/Output from previous day: 08/15 0701 - 08/16 0700 In: 506.2 [P.O.:250; I.V.:256.2] Out: 1867  Intake/Output from this shift:    Physical Exam: Neck: no adenopathy, no carotid bruit, no JVD and supple, symmetrical, trachea midline Lungs: Decreased breath sound at bases Heart: regular rate and rhythm, S1, S2 normal and Soft systolic murmur noted Abdomen: soft, non-tender; bowel sounds normal; no masses,  no organomegaly Extremities: extremities normal, atraumatic, no cyanosis or edema  Lab Results:  Recent Labs  08/25/14 0230 08/26/14 0517  WBC 12.3* 8.0  HGB 8.5* 8.3*  PLT 68* 82*    Recent Labs  08/25/14 0230 08/26/14 0517  NA 134* 137  K 4.0 3.7  CL 95* 99*  CO2 28 28  GLUCOSE 143* 107*  BUN 22* 20  CREATININE 4.22* 3.47*    Recent Labs  08/25/14 0230 08/26/14 0517  TROPONINI 3.21* 2.20*   Hepatic Function Panel  Recent Labs  08/24/14 2330  PROT 6.1*  ALBUMIN 1.8*  AST 47*  ALT 11*  ALKPHOS 125  BILITOT 1.0  BILIDIR 0.3  IBILI 0.7   No results for input(s): CHOL in the last 72 hours. No results for input(s): PROTIME in the last 72 hours.  Imaging: Imaging results have been reviewed and Dg Abd Portable 1v  08/25/2014   CLINICAL DATA:  Coffee-ground emesis.  Vomiting yesterday and today.  EXAM: PORTABLE ABDOMEN - 1 VIEW  COMPARISON:  Chest radiograph 08/23/2014  FINDINGS:  Moderate distension of the stomach with gas. There is a nonobstructive bowel gas pattern. Moderate amount of stool in the right abdomen. Gas and stool in the rectal region. Vascular calcifications in the aorta and iliac arteries. Limited evaluation for free air on these portable views.  IMPRESSION: Nonobstructive bowel gas pattern.  Moderate distention of the stomach.   Electronically Signed   By: Richarda Overlie M.D.   On: 08/25/2014 12:57    Cardiac Studies:  Assessment/Plan:  Status post Acute small non-Q-wave myocardial infarction probably secondary to demand ischemia/hypotension rule out progression of CAD Multivessel CAD history of silent inferoposterior wall MI in the past with occluded RCA and left circumflex as above Gram-negative rod bacteremia probably catheter related Status post hypoglycemic shock New-onset A. 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 Consider GI evaluation Medical management from cardiac point of view for now unless patient develops significant chest pain EKG changes or elevated troponin I discuss with family and agrees. Patient does have significant CAD with occluded RCA and left circumflex  in the past and significant stenosis and acute marginal branch which is moderate size in the past which was treated medically. Patient will benefit from chronic anticoagulation in view of atrial fibrillation with chads vasc score of 5 once cleared by GI  LOS: 3 days    Rinaldo Cloud 08/26/2014, 9:25 AM

## 2014-08-26 NOTE — Progress Notes (Signed)
Pt is being monitored for aspiration. I gave him a few sips of water before med pass and the pt was having difficulty swallowing. He was able to take his 0800 and 1000 medication but i gave him sips of fluid before admin. The patients lunch arrived and the family decided to feed him. The pt only ate 25% of his lunch. After lunch was given the pt began to cough as if he was choking or clearing his throat. I educated the family on letting the nurse or tech give meals until we knew more about his ability to swallow.

## 2014-08-27 ENCOUNTER — Encounter (HOSPITAL_COMMUNITY): Payer: Self-pay | Admitting: Anesthesiology

## 2014-08-27 ENCOUNTER — Inpatient Hospital Stay (HOSPITAL_COMMUNITY): Payer: Medicare Other

## 2014-08-27 ENCOUNTER — Inpatient Hospital Stay (HOSPITAL_COMMUNITY): Payer: Medicare Other | Admitting: Anesthesiology

## 2014-08-27 ENCOUNTER — Encounter (HOSPITAL_COMMUNITY)
Admission: EM | Disposition: A | Discharge status: Home Health | Payer: Self-pay | Source: Home / Self Care | Attending: Internal Medicine

## 2014-08-27 DIAGNOSIS — R579 Shock, unspecified: Secondary | ICD-10-CM

## 2014-08-27 DIAGNOSIS — I96 Gangrene, not elsewhere classified: Secondary | ICD-10-CM

## 2014-08-27 DIAGNOSIS — D696 Thrombocytopenia, unspecified: Secondary | ICD-10-CM

## 2014-08-27 HISTORY — PX: INSERTION OF DIALYSIS CATHETER: SHX1324

## 2014-08-27 LAB — GLUCOSE, CAPILLARY
GLUCOSE-CAPILLARY: 101 mg/dL — AB (ref 65–99)
GLUCOSE-CAPILLARY: 104 mg/dL — AB (ref 65–99)
GLUCOSE-CAPILLARY: 92 mg/dL (ref 65–99)
GLUCOSE-CAPILLARY: 92 mg/dL (ref 65–99)
GLUCOSE-CAPILLARY: 98 mg/dL (ref 65–99)
Glucose-Capillary: 92 mg/dL (ref 65–99)
Glucose-Capillary: 103 mg/dL — ABNORMAL HIGH (ref 65–99)
Glucose-Capillary: 106 mg/dL — ABNORMAL HIGH (ref 65–99)
Glucose-Capillary: 96 mg/dL (ref 65–99)

## 2014-08-27 LAB — PROTIME-INR
INR: 2.09 — ABNORMAL HIGH (ref 0.00–1.49)
Prothrombin Time: 23.4 seconds — ABNORMAL HIGH (ref 11.6–15.2)

## 2014-08-27 LAB — BASIC METABOLIC PANEL
Anion gap: 10 (ref 5–15)
BUN: 31 mg/dL — ABNORMAL HIGH (ref 6–20)
CHLORIDE: 99 mmol/L — AB (ref 101–111)
CO2: 25 mmol/L (ref 22–32)
Calcium: 8.6 mg/dL — ABNORMAL LOW (ref 8.9–10.3)
Creatinine, Ser: 4.59 mg/dL — ABNORMAL HIGH (ref 0.61–1.24)
GFR calc non Af Amer: 12 mL/min — ABNORMAL LOW (ref >60)
GFR, EST AFRICAN AMERICAN: 14 mL/min — AB (ref >60)
Glucose, Bld: 103 mg/dL — ABNORMAL HIGH (ref 65–99)
POTASSIUM: 4 mmol/L (ref 3.5–5.1)
SODIUM: 134 mmol/L — AB (ref 135–145)

## 2014-08-27 LAB — CBC
HEMATOCRIT: 25.2 % — AB (ref 39.0–52.0)
Hemoglobin: 8 g/dL — ABNORMAL LOW (ref 13.0–17.0)
MCH: 27 pg (ref 26.0–34.0)
MCHC: 31.7 g/dL (ref 30.0–36.0)
MCV: 85.1 fL (ref 78.0–100.0)
Platelets: 60 10*3/uL — ABNORMAL LOW (ref 150–400)
RBC: 2.96 MIL/uL — ABNORMAL LOW (ref 4.22–5.81)
RDW: 16.2 % — AB (ref 11.5–15.5)
WBC: 6.3 10*3/uL (ref 4.0–10.5)

## 2014-08-27 LAB — ABO/RH: ABO/RH(D): A POS

## 2014-08-27 SURGERY — INSERTION OF DIALYSIS CATHETER
Anesthesia: Monitor Anesthesia Care | Site: Neck | Laterality: Right

## 2014-08-27 MED ORDER — FENTANYL CITRATE (PF) 100 MCG/2ML IJ SOLN
INTRAMUSCULAR | Status: DC | PRN
  Administered 2014-08-27: 50 ug via INTRAVENOUS

## 2014-08-27 MED ORDER — HEPARIN SODIUM (PORCINE) 1000 UNIT/ML IJ SOLN
INTRAMUSCULAR | Status: AC
  Filled 2014-08-27: qty 1

## 2014-08-27 MED ORDER — PROMETHAZINE HCL 25 MG/ML IJ SOLN: 6.2500 mg | INTRAMUSCULAR | Status: DC | PRN

## 2014-08-27 MED ORDER — LIDOCAINE-EPINEPHRINE (PF) 1 %-1:200000 IJ SOLN
INTRAMUSCULAR | Status: AC
  Filled 2014-08-27: qty 30

## 2014-08-27 MED ORDER — MIDAZOLAM HCL 2 MG/2ML IJ SOLN
INTRAMUSCULAR | Status: AC
  Filled 2014-08-27: qty 4

## 2014-08-27 MED ORDER — MIDAZOLAM HCL 2 MG/2ML IJ SOLN: 0.5000 mg | Freq: Once | INTRAMUSCULAR | Status: DC | PRN

## 2014-08-27 MED ORDER — HEPARIN SODIUM (PORCINE) 1000 UNIT/ML IJ SOLN
INTRAMUSCULAR | Status: DC | PRN
  Administered 2014-08-27: 4.2 mL via INTRAVENOUS

## 2014-08-27 MED ORDER — SODIUM CHLORIDE 0.9 % IV SOLN: Freq: Once | INTRAVENOUS | Status: DC

## 2014-08-27 MED ORDER — LIDOCAINE-EPINEPHRINE (PF) 1 %-1:200000 IJ SOLN
INTRAMUSCULAR | Status: DC | PRN
  Administered 2014-08-27: 15 mL

## 2014-08-27 MED ORDER — 0.9 % SODIUM CHLORIDE (POUR BTL) OPTIME
TOPICAL | Status: DC | PRN
  Administered 2014-08-27: 1000 mL

## 2014-08-27 MED ORDER — PROPOFOL 10 MG/ML IV BOLUS
INTRAVENOUS | Status: AC
  Filled 2014-08-27: qty 20

## 2014-08-27 MED ORDER — FENTANYL CITRATE (PF) 250 MCG/5ML IJ SOLN
INTRAMUSCULAR | Status: AC
  Filled 2014-08-27: qty 5

## 2014-08-27 MED ORDER — AMIODARONE HCL 200 MG PO TABS
200.0000 mg | ORAL_TABLET | Freq: Every day | ORAL | Status: DC
  Administered 2014-08-29 – 2014-08-31 (×2): 200 mg via ORAL
  Filled 2014-08-27 (×5): qty 1

## 2014-08-27 MED ORDER — FENTANYL CITRATE (PF) 100 MCG/2ML IJ SOLN: 25.0000 ug | INTRAMUSCULAR | Status: DC | PRN

## 2014-08-27 MED ORDER — MIDAZOLAM HCL 5 MG/5ML IJ SOLN
INTRAMUSCULAR | Status: DC | PRN
  Administered 2014-08-27: 2 mg via INTRAVENOUS

## 2014-08-27 MED ORDER — SODIUM CHLORIDE 0.9 % IJ SOLN
10.0000 mL | Freq: Two times a day (BID) | INTRAMUSCULAR | Status: DC
  Administered 2014-08-28 – 2014-08-29 (×3): 10 mL
  Administered 2014-08-30: 40 mL

## 2014-08-27 MED ORDER — MEPERIDINE HCL 25 MG/ML IJ SOLN: 6.2500 mg | INTRAMUSCULAR | Status: DC | PRN

## 2014-08-27 MED ORDER — SODIUM CHLORIDE 0.9 % IR SOLN
Status: DC | PRN
  Administered 2014-08-27: 500 mL

## 2014-08-27 MED ORDER — SODIUM CHLORIDE 0.9 % IJ SOLN
10.0000 mL | INTRAMUSCULAR | Status: DC | PRN
  Administered 2014-08-29 (×3): 10 mL
  Administered 2014-08-30: 20 mL
  Administered 2014-08-31: 10 mL
  Filled 2014-08-27 (×5): qty 40

## 2014-08-27 SURGICAL SUPPLY — 41 items
BAG DECANTER FOR FLEXI CONT (MISCELLANEOUS) ×3 IMPLANT
BIOPATCH RED 1 DISK 7.0 (GAUZE/BANDAGES/DRESSINGS) ×2 IMPLANT
BIOPATCH RED 1IN DISK 7.0MM (GAUZE/BANDAGES/DRESSINGS) ×1
CATH CANNON HEMO 15F 50CM (CATHETERS) IMPLANT
CATH CANNON HEMO 15FR 19 (HEMODIALYSIS SUPPLIES) ×3 IMPLANT
CATH CANNON HEMO 15FR 23CM (HEMODIALYSIS SUPPLIES) IMPLANT
CATH CANNON HEMO 15FR 31CM (HEMODIALYSIS SUPPLIES) IMPLANT
CATH CANNON HEMO 15FR 32CM (HEMODIALYSIS SUPPLIES) IMPLANT
COVER PROBE W GEL 5X96 (DRAPES) IMPLANT
COVER SURGICAL LIGHT HANDLE (MISCELLANEOUS) ×3 IMPLANT
DECANTER SPIKE VIAL GLASS SM (MISCELLANEOUS) ×3 IMPLANT
DRAPE C-ARM 42X72 X-RAY (DRAPES) ×3 IMPLANT
DRAPE CHEST BREAST 15X10 FENES (DRAPES) ×3 IMPLANT
GAUZE SPONGE 2X2 8PLY STRL LF (GAUZE/BANDAGES/DRESSINGS) ×1 IMPLANT
GAUZE SPONGE 4X4 16PLY XRAY LF (GAUZE/BANDAGES/DRESSINGS) ×3 IMPLANT
GLOVE BIOGEL PI IND STRL 6.5 (GLOVE) ×2 IMPLANT
GLOVE BIOGEL PI INDICATOR 6.5 (GLOVE) ×4
GLOVE SS BIOGEL STRL SZ 7 (GLOVE) ×1 IMPLANT
GLOVE SUPERSENSE BIOGEL SZ 7 (GLOVE) ×2
GLOVE SURG SS PI 7.0 STRL IVOR (GLOVE) ×3 IMPLANT
GOWN STRL REUS W/ TWL LRG LVL3 (GOWN DISPOSABLE) ×2 IMPLANT
GOWN STRL REUS W/TWL LRG LVL3 (GOWN DISPOSABLE) ×4
KIT BASIN OR (CUSTOM PROCEDURE TRAY) ×3 IMPLANT
KIT ROOM TURNOVER OR (KITS) ×3 IMPLANT
LIQUID BAND (GAUZE/BANDAGES/DRESSINGS) ×3 IMPLANT
NEEDLE 18GX1X1/2 (RX/OR ONLY) (NEEDLE) ×3 IMPLANT
NEEDLE 22X1 1/2 (OR ONLY) (NEEDLE) ×3 IMPLANT
NEEDLE HYPO 25GX1X1/2 BEV (NEEDLE) ×3 IMPLANT
NS IRRIG 1000ML POUR BTL (IV SOLUTION) ×3 IMPLANT
PACK SURGICAL SETUP 50X90 (CUSTOM PROCEDURE TRAY) ×3 IMPLANT
PAD ARMBOARD 7.5X6 YLW CONV (MISCELLANEOUS) ×6 IMPLANT
SOAP 2 % CHG 4 OZ (WOUND CARE) ×3 IMPLANT
SPONGE GAUZE 2X2 STER 10/PKG (GAUZE/BANDAGES/DRESSINGS) ×2
SUT ETHILON 3 0 PS 1 (SUTURE) ×3 IMPLANT
SUT VICRYL 4-0 PS2 18IN ABS (SUTURE) ×3 IMPLANT
SYR 20CC LL (SYRINGE) ×3 IMPLANT
SYR 5ML LL (SYRINGE) ×6 IMPLANT
SYR CONTROL 10ML LL (SYRINGE) ×3 IMPLANT
SYRINGE 10CC LL (SYRINGE) ×3 IMPLANT
TAPE CLOTH SURG 4X10 WHT LF (GAUZE/BANDAGES/DRESSINGS) ×3 IMPLANT
WATER STERILE IRR 1000ML POUR (IV SOLUTION) ×3 IMPLANT

## 2014-08-27 NOTE — Interval H&P Note (Signed)
History and Physical Interval Note:  08/27/2014 10:40 AM  Greg [name]  has presented today for surgery, with the diagnosis of End Stage Renal Disease N18.6  The various methods of treatment have been discussed with the patient and family. After consideration of risks, benefits and other options for treatment, the patient has consented to  Procedure(s): INSERTION OF DIALYSIS CATHETER (N/A) as a surgical intervention .  The patient's history has been reviewed, patient examined, no change in status, stable for surgery.  I have reviewed the patient's chart and labs.  Questions were answered to the patient's satisfaction.     Josephina Gip

## 2014-08-27 NOTE — Progress Notes (Signed)
Patient ID: Jobani Arabie, male   DOB: July 03, 1945, 69 y.o.   MRN: [id number]  [facility] Progress Note   Assessment/ Plan:   1. Escherichia coli bacteremia (suspected CRB but the same organism was isolated from his urine culture): On Primaxin--currently on dialysis catheter holiday and with plans of new catheter placement today. Status post plication of AV fistula that is currently not suitable for cannulation (still healing). 2. ESRD: We'll order for hemodialysis again tomorrow-he does not have any acute needs at this time (volume status/electrolytes acceptable, no uremic symptoms). 3. Anemia: No overt losses, continue to monitor hemoglobin/hematocrit for PRBC triggers 4. CKD-MBD: Resumed phosphorus binders/vitamin D receptor analogues 5. Protein/calorie malnutrition: Secondary to chronic illness/recent surgery and ischemic left foot fifth toe (malnutrition-inflammation complex) Continue renal diet with oral nutritional supplementation for hypoalbuminemia 6. Hypertension: Blood pressures currently acceptable limits, continue to monitor  Subjective:   Denies any complaints when awoken- some difficulty with swallowing noted yesterday. EEG negative for epileptiform activity.    Objective:   BP 143/54 mmHg  Pulse 62  Temp(Src) 97.7 F (36.5 C) (Oral)  Resp 18  Wt 65.545 kg (144 lb 8 oz)  SpO2 100%  Physical Exam: Gen: Sleeping soundly in bed and awoken with some difficulty CVS: Pulse regular in rate and rhythm, S1 and S2 normal Resp: Clear to auscultation, no rales/rhonchi Abd: Soft, flat, nontender Ext: No lower extremity edema-status post recent LUA aVF plication. Ischemic left fifth toe  Labs: BMET  Recent Labs Lab 08/23/14 1934 08/24/14 0448 08/25/14 0230 08/26/14 0517 08/27/14 0442  NA 137 136 134* 137 134*  K 3.1* 3.7 4.0 3.7 4.0  CL 96* 99* 95* 99* 99*  CO2 28 27 28 28 25   GLUCOSE 118* 169* 143* 107* 103*  BUN 11 14 22* 20 31*  CREATININE 3.52* 3.56*  4.22* 3.47* 4.59*  CALCIUM 8.6* 8.3* 9.6 9.3 8.6*  PHOS  --  3.2 3.6  --   --    CBC  Recent Labs Lab 08/23/14 1934 08/24/14 0448 08/25/14 0230 08/26/14 0517 08/27/14 0442  WBC 6.0 16.0* 12.3* 8.0 6.3  NEUTROABS 5.8  --   --  6.3  --   HGB 8.9* 9.0* 8.5* 8.3* 8.0*  HCT 28.3* 27.8* 26.0* 25.1* 25.2*  MCV 86.0 86.6 83.9 85.1 85.1  PLT 77* 68* 68* 82* 60*   Medications:    . amiodarone  200 mg Oral BID  . atorvastatin  40 mg Oral QHS  . clopidogrel  75 mg Oral QODAY  . imipenem-cilastatin  250 mg Intravenous Q12H  . insulin aspart  0-9 Units Subcutaneous 6 times per day  . lanthanum  500 mg Oral TID WC   Zetta Bills, MD 08/27/2014, 7:33 AM

## 2014-08-27 NOTE — H&P (View-Only) (Signed)
[facility]  Subjective  - Patient comfortable.   Objective 141/60 70 97.6 F (36.4 C) (Oral) 18 100%  Intake/Output Summary (Last 24 hours) at 08/26/14 0737 Last data filed at 08/25/14 2100  Gross per 24 hour  Intake  506.2 ml  Output   1867 ml  Net -1360.8 ml    Right IJ removal site dressing is clean and dry Left fistula palpable thrill, incision healing well   Assessment/Planning: 3 weeks s/p revision of left AV fistula 1 day after removal of right IJ catheter Plan for new tunnel catheter tomorrow Dr. Lawson WBC 8.0 today, plt improved 82 K/ul    Kendal Raffo MAUREEN 08/26/2014 7:37 AM --  Laboratory Lab Results:  Recent Labs  08/25/14 0230 08/26/14 0517  WBC 12.3* 8.0  HGB 8.5* 8.3*  HCT 26.0* 25.1*  PLT 68* 82*   BMET  Recent Labs  08/25/14 0230 08/26/14 0517  NA 134* 137  K 4.0 3.7  CL 95* 99*  CO2 28 28  GLUCOSE 143* 107*  BUN 22* 20  CREATININE 4.22* 3.47*  CALCIUM 9.6 9.3    COAG Lab Results  Component Value Date   INR 1.19 07/19/2014   INR 1.08 08/11/2010   No results found for: PTT     

## 2014-08-27 NOTE — Progress Notes (Signed)
Report given to celine harris rn as caregiver 

## 2014-08-27 NOTE — Progress Notes (Addendum)
Pt arrived from PACU alert and oriented. Pt in SB with first degree block. Unsure of time of conversion.   EKG confirmed. Per PACU RN they do not know when pt converted from afib to SB. Paged Dr. Jeannette How concerning amiodarone drip and new diet.   Pt resting comfortably.   Will continue to monitor.   Greg [name]   Paged x2 at [phone]

## 2014-08-27 NOTE — Progress Notes (Signed)
Subjective:  Patient denies any chest pain or shortness of breath.remains in sinus rhythm.blood cultures were positive for Escherichia coli bacteremia and also urine culture positive for Escherichia coli  Objective:  Vital Signs in the last 24 hours: Temp:  [97.7 F (36.5 C)-98.2 F (36.8 C)] 98.2 F (36.8 C) (08/17 1000) Pulse Rate:  [60-63] 60 (08/17 1000) Resp:  [16-18] 18 (08/17 1000) BP: (132-143)/(50-54) 142/53 mmHg (08/17 1000) SpO2:  [99 %-100 %] 100 % (08/17 1000) Weight:  [65.545 kg (144 lb 8 oz)] 65.545 kg (144 lb 8 oz) (08/17 0426)  Intake/Output from previous day: 08/16 0701 - 08/17 0700 In: 60 [P.O.:60] Out: -  Intake/Output from this shift:    Physical Exam: Neck: no adenopathy, no carotid bruit, no JVD and supple, symmetrical, trachea midline Lungs: clear to auscultation bilaterally Heart: regular rate and rhythm, S1, S2 normal and soft systolic murmur noted.  No pericardial rub Abdomen: soft, non-tender; bowel sounds normal; no masses,  no organomegaly Extremities: extremities normal, atraumatic, no cyanosis or edema  Lab Results:  Recent Labs  08/26/14 0517 08/27/14 0442  WBC 8.0 6.3  HGB 8.3* 8.0*  PLT 82* 60*    Recent Labs  08/26/14 0517 08/27/14 0442  NA 137 134*  K 3.7 4.0  CL 99* 99*  CO2 28 25  GLUCOSE 107* 103*  BUN 20 31*  CREATININE 3.47* 4.59*    Recent Labs  08/25/14 0230 08/26/14 0517  TROPONINI 3.21* 2.20*   Hepatic Function Panel  Recent Labs  08/24/14 2330  PROT 6.1*  ALBUMIN 1.8*  AST 47*  ALT 11*  ALKPHOS 125  BILITOT 1.0  BILIDIR 0.3  IBILI 0.7   No results for input(s): CHOL in the last 72 hours. No results for input(s): PROTIME in the last 72 hours.  Imaging: Imaging results have been reviewed and Dg Abd Portable 1v  08/25/2014   CLINICAL DATA:  Coffee-ground emesis.  Vomiting yesterday and today.  EXAM: PORTABLE ABDOMEN - 1 VIEW  COMPARISON:  Chest radiograph 08/23/2014  FINDINGS: Moderate distension  of the stomach with gas. There is a nonobstructive bowel gas pattern. Moderate amount of stool in the right abdomen. Gas and stool in the rectal region. Vascular calcifications in the aorta and iliac arteries. Limited evaluation for free air on these portable views.  IMPRESSION: Nonobstructive bowel gas pattern.  Moderate distention of the stomach.   Electronically Signed   By: Richarda Overlie M.D.   On: 08/25/2014 12:57    Cardiac Studies:  Assessment/Plan:  Status post Acute small non-Q-wave myocardial infarction probably secondary to demand ischemia/hypotension rule out progression of CAD Multivessel CAD history of silent inferoposterior wall MI in the past with occluded RCA and left circumflex as above Gram-negative rod bacteremia probably catheter related Status post hypoglycemic shock New-onset A. 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 Check EKG in a.m. Medical management from cardiac point of view at this point.  Patient does have multivessel CAD, will require invasive workup  In near future once more stable.  LOS: 4 days    Rinaldo Cloud 08/27/2014, 10:51 AM

## 2014-08-27 NOTE — Op Note (Signed)
OPERATIVE REPORT  Date of Surgery: 08/23/2014 - 08/27/2014  Surgeon: Josephina Gip, MD  Assistant: Nurse  Pre-op Diagnosis: End Stage Renal Disease N18.6  Post-op Diagnosis: End Stage Renal Disease N18.6  Procedure: Procedure(s): INSERTION OF DIALYSIS CATHETER RIGHT INTERNAL JUGULAR VEIN--19 cm-Diateck--ultrasound-guided  Anesthesia: Mac  EBL: Minimal  Complications: None  Procedure Details: The patient was taken the operating room placed in the Trendelenburg position upper chest and neck were prepped Betadine scrub and solution draped in routine sterile manner. The right internal jugular vein was imaged using B-mode ultrasound-sono site was noted to be widely patent. After infiltration with 1% Xylocaine with epinephrine the right IJ was entered using a supraclavicular approach guidewire passed in the right atrium under fluoroscopic guidance. After dilating the tract appropriately a 19 cm tunneled catheter was passed through peel-away sheath position and the right atrium tunneled peripherally secured with nylon sutures. Wound was closed with Vicryls septic or fashion plus Dermabond. Patient taken to recovery room in satisfactory condition for chest x-ray   Josephina Gip, MD 08/27/2014 2:37 PM

## 2014-08-27 NOTE — Progress Notes (Signed)
Dr. Jeannette How ordered NPO and speech evaluation to be completed. RN educated family about not eating anything. Family is non compliant and has snuck things to eat and drink to the pt. RN observed more coughing from patient, but he appears stable and breathing easily. RN reeducated concerning NPO status and the need to wait for a speech evaluation. Family verbalized understanding. There is some language barrier as the patient is from [place] and speaks limited english. Family appears to understand English very well.   Will continue to monitor and reinforce education .  Edgardo Roys

## 2014-08-27 NOTE — Progress Notes (Signed)
Dr. Sharyn Lull office paged concerning conversion to SB with 1st degree block.   Amiodarone drip going at 8.3.   Dr. Sharyn Lull ordered to d/c amio drip and administer PO dose of amio 200mg  this evening and continue PO amio daily.   Pt stable and resting comfortably. Family members at bedside.   Edgardo Roys

## 2014-08-27 NOTE — Transfer of Care (Signed)
Immediate Anesthesia Transfer of Care Note  Patient: Greg [name]  Procedure(s) Performed: Procedure(s): INSERTION OF DIALYSIS CATHETER RIGHT INTERNAL JUGULAR VEIN (Right)  Patient Location: PACU  Anesthesia Type:MAC  Level of Consciousness: awake, alert , oriented and patient cooperative  Airway & Oxygen Therapy: Patient Spontanous Breathing and Patient connected to face mask oxygen  Post-op Assessment: Report given to RN and Post -op Vital signs reviewed and stable  Post vital signs: Reviewed and stable  Last Vitals:  Filed Vitals:   08/27/14 1319  BP: 172/49  Pulse: 56  Temp: 36.4 C  Resp: 17    Complications: No apparent anesthesia complications

## 2014-08-27 NOTE — Progress Notes (Signed)
[facility] PROGRESS NOTE Interim History: 69 year old male with past medical history of end-stage renal disease with a dietetic cath they ESBL Escherichia coli bacteremia status post removal of his dialysis catheter. Start her empirically on IV antibiotics bank's and Elita Quick now D escalated to imipenem. With elevated troponins in the setting of new onset of atrial fibrillation, which cardiology was consulted and recommended medical management currently on Plavix and amiodarone. With diabetic foot ulcer with dry gangrene.   Assessment/Plan: Sepsis due to ESBL Escherichia coli associated with dialysis catheter and arterial hypotension: He has dry gangrene of his fourth and fifth toe and he has had in the past with history of MSSA osteomyelitis. Started empirically on vancomycin and Fortaz changed to imipenem and sensitivities of blood cultures. Catheter tip cultures are pending, repeat blood cultures on 08/26/2014 have remained negative. Has been bedbound for a few years, consult physical therapy.  Elevated troponin in the setting of new onset atrial fibrillation: Cardiology is following, recommended medical management. Nose clear by vascular surgery can start oral anticoagulation. Currently on amiodarone and Plavix per cardiology, 2-D echo showed an ejection fraction of 45-50% with severe hypokinesia of the basal and mid inferior myocardium.  End-stage renal disease with dialysis Tuesday Thursday and Saturdays: Nephrology was consulted, dialysis catheter was removed on 08/25/2014. HD catheter  replacement on 08/27/2014. AP fistula is not mature.  Anemia/thrombocytopenia: Mild trend downwards of hemoglobin and platelets platelets have remained greater than 50,000 and hemoglobin above 8. I will continue to monitor it.  Controlled diabetes mellitus type 2: Continue long-acting insulin plus sliding scale.  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 Awaiting recommendations and possible amputation.  Severe deconditioning: Physical therapy evaluation is pending.  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  Toxic metabolic encephalopathy: Likely due to sepsis and hypoglycemia, EEGs pending.   Code Status: full Family Communication: son's  Disposition Plan: home in 4-5 days   Consultants:  Vascular  Renal  Procedures:  Dialysis catheter replacement on 08/27/2014  2-D echo that showed an EF of 45%  Antibiotics:  Imipenem  HPI/Subjective: He's awake still sleepy but relates he feels much better than on admission.  Objective: Filed Vitals:   08/26/14 0457 08/26/14 1346 08/26/14 2014 08/27/14 0426  BP: 141/60 137/52 132/50 143/54  Pulse: 70 63 60 62  Temp: 97.6 F (36.4 C) 98 F (36.7 C) 98.2 F (36.8 C) 97.7 F (36.5 C)  TempSrc: Oral Axillary Oral Oral  Resp: 18 16 18 18   Weight: 67.042 kg (147 lb 12.8 oz)   65.545 kg (144 lb 8 oz)  SpO2: 100% 99% 100% 100%    Intake/Output Summary (Last 24 hours) at 08/27/14 0956 Last data filed at 08/26/14 1700  Gross per 24 hour  Intake      0 ml  Output      0 ml  Net      0 ml   Filed Weights   08/25/14 1050 08/26/14 0457 08/27/14 0426  Weight: 64.1 kg (141 lb 5 oz) 67.042 kg (147 lb 12.8 oz) 65.545 kg (144 lb 8 oz)    Exam:  General: Alert, awake, oriented x2, in no acute distress.  HEENT: No bruits, no goiter.  Heart: Regular rate and rhythm. Lungs: Good air movement, clear Abdomen: Soft, nontender, nondistended, positive bowel sounds.  Neuro: Grossly intact, nonfocal.   Data Reviewed: Basic  Metabolic Panel:  Recent Labs Lab 08/23/14 1934 08/23/14 2227 08/24/14 0448 08/25/14 0230 08/26/14 0517 08/27/14 0442  NA 137  --  136 134* 137 134*  K 3.1*  --  3.7 4.0 3.7 4.0  CL 96*  --  99* 95* 99* 99*  CO2 28  --  27 28 28 25   GLUCOSE 118*  --   169* 143* 107* 103*  BUN 11  --  14 22* 20 31*  CREATININE 3.52*  --  3.56* 4.22* 3.47* 4.59*  CALCIUM 8.6*  --  8.3* 9.6 9.3 8.6*  MG  --  1.0* 2.1 2.6*  --   --   PHOS  --   --  3.2 3.6  --   --    Liver Function Tests:  Recent Labs Lab 08/23/14 1934 08/24/14 2330  AST 42* 47*  ALT 10* 11*  ALKPHOS 168* 125  BILITOT 0.8 1.0  PROT 6.2* 6.1*  ALBUMIN 2.1* 1.8*    Recent Labs Lab 08/24/14 2330  LIPASE 14*   No results for input(s): AMMONIA in the last 168 hours. CBC:  Recent Labs Lab 08/23/14 1934 08/24/14 0448 08/25/14 0230 08/26/14 0517 08/27/14 0442  WBC 6.0 16.0* 12.3* 8.0 6.3  NEUTROABS 5.8  --   --  6.3  --   HGB 8.9* 9.0* 8.5* 8.3* 8.0*  HCT 28.3* 27.8* 26.0* 25.1* 25.2*  MCV 86.0 86.6 83.9 85.1 85.1  PLT 77* 68* 68* 82* 60*   Cardiac Enzymes:  Recent Labs Lab 08/23/14 2227 08/24/14 0427 08/24/14 1050 08/25/14 0230 08/26/14 0517  TROPONINI 0.32* 1.13* 2.56* 3.21* 2.20*   BNP (last 3 results) No results for input(s): BNP in the last 8760 hours.  ProBNP (last 3 results) No results for input(s): PROBNP in the last 8760 hours.  CBG:  Recent Labs Lab 08/26/14 1607 08/26/14 2008 08/27/14 0003 08/27/14 0420 08/27/14 0812  GLUCAP 107* 102* 98 101* 92    Recent Results (from the past 240 hour(s))  Culture, blood (routine x 2)     Status: None   Collection Time: 08/23/14  7:36 PM  Result Value Ref Range Status   Specimen Description BLOOD RIGHT ANTECUBITAL  Final   Special Requests BOTTLES DRAWN AEROBIC AND ANAEROBIC 5CC   Final   Culture  Setup Time   Final    GRAM NEGATIVE RODS IN BOTH AEROBIC AND ANAEROBIC BOTTLES CRITICAL RESULT CALLED TO, READ BACK BY AND VERIFIED WITH: P CLAUDIO 08/24/14 @1035  M VESTAL    Culture   Final    ESCHERICHIA COLI Confirmed Extended Spectrum Beta-Lactamase Producer (ESBL)    Report Status 08/26/2014 FINAL  Final   Organism ID, Bacteria ESCHERICHIA COLI  Final      Susceptibility   Escherichia coli -  MIC*    AMPICILLIN >=32 RESISTANT Resistant     CEFAZOLIN >=64 RESISTANT Resistant     CEFEPIME 8 RESISTANT Resistant     CEFTAZIDIME 16 RESISTANT Resistant     CEFTRIAXONE >=64 RESISTANT Resistant     CIPROFLOXACIN >=4 RESISTANT Resistant     GENTAMICIN >=16 RESISTANT Resistant     IMIPENEM <=0.25 SENSITIVE Sensitive     TRIMETH/SULFA >=320 RESISTANT Resistant     AMPICILLIN/SULBACTAM >=32 RESISTANT Resistant     PIP/TAZO 8 SENSITIVE Sensitive     * ESCHERICHIA COLI  Culture, blood (routine x 2)     Status: None   Collection Time: 08/23/14  8:04 PM  Result Value Ref Range Status   Specimen Description BLOOD  RIGHT HAND  Final   Special Requests BOTTLES DRAWN AEROBIC AND ANAEROBIC 4CC   Final   Culture  Setup Time   Final    GRAM NEGATIVE RODS CRITICAL RESULT CALLED TO, READ BACK BY AND VERIFIED WITH: T PHILLIPS 08/24/14 @ 1208 M VESTAL IN BOTH AEROBIC AND ANAEROBIC BOTTLES CONFIRMED BY K WOOTEN    Culture   Final    ESCHERICHIA COLI SUSCEPTIBILITIES PERFORMED ON PREVIOUS CULTURE WITHIN THE LAST 5 DAYS.    Report Status 08/26/2014 FINAL  Final  Urine culture     Status: None   Collection Time: 08/23/14  9:13 PM  Result Value Ref Range Status   Specimen Description URINE, CATHETERIZED  Final   Special Requests NONE  Final   Culture   Final    >=100,000 COLONIES/mL ESCHERICHIA COLI Confirmed Extended Spectrum Beta-Lactamase Producer (ESBL)    Report Status 08/26/2014 FINAL  Final   Organism ID, Bacteria ESCHERICHIA COLI  Final      Susceptibility   Escherichia coli - MIC*    AMPICILLIN >=32 RESISTANT Resistant     CEFAZOLIN >=64 RESISTANT Resistant     CEFTRIAXONE >=64 RESISTANT Resistant     CIPROFLOXACIN >=4 RESISTANT Resistant     GENTAMICIN >=16 RESISTANT Resistant     IMIPENEM <=0.25 SENSITIVE Sensitive     NITROFURANTOIN <=16 SENSITIVE Sensitive     TRIMETH/SULFA >=320 RESISTANT Resistant     AMPICILLIN/SULBACTAM >=32 RESISTANT Resistant     PIP/TAZO 8 SENSITIVE  Sensitive     * >=100,000 COLONIES/mL ESCHERICHIA COLI  MRSA PCR Screening     Status: None   Collection Time: 08/24/14  2:26 AM  Result Value Ref Range Status   MRSA by PCR NEGATIVE NEGATIVE Final    Comment:        The GeneXpert MRSA Assay (FDA approved for NASAL specimens only), is one component of a comprehensive MRSA colonization surveillance program. It is not intended to diagnose MRSA infection nor to guide or monitor treatment for MRSA infections.   Cath Tip Culture     Status: None (Preliminary result)   Collection Time: 08/25/14 12:12 PM  Result Value Ref Range Status   Specimen Description CATH TIP HEMODIALYSIS CATHETER  Final   Special Requests NONE  Final   Culture   Final    Culture reincubated for better growth Performed at [facility]    Report Status PENDING  Incomplete     Studies: Dg Abd Portable 1v  08/25/2014   CLINICAL DATA:  Coffee-ground emesis.  Vomiting yesterday and today.  EXAM: PORTABLE ABDOMEN - 1 VIEW  COMPARISON:  Chest radiograph 08/23/2014  FINDINGS: Moderate distension of the stomach with gas. There is a nonobstructive bowel gas pattern. Moderate amount of stool in the right abdomen. Gas and stool in the rectal region. Vascular calcifications in the aorta and iliac arteries. Limited evaluation for free air on these portable views.  IMPRESSION: Nonobstructive bowel gas pattern.  Moderate distention of the stomach.   Electronically Signed   By: Richarda Overlie M.D.   On: 08/25/2014 12:57    Scheduled Meds: . amiodarone  200 mg Oral BID  . atorvastatin  40 mg Oral QHS  . clopidogrel  75 mg Oral QODAY  . imipenem-cilastatin  250 mg Intravenous Q12H  . insulin aspart  0-9 Units Subcutaneous 6 times per day  . lanthanum  500 mg Oral TID WC   Continuous Infusions: . amiodarone 15 mg/hr (08/26/14 1746)    Time Spent:  25 min   Marinda Elk  [facility] Pager [phone]. If 7PM-7AM, please contact night-coverage at  [email or web address], password [facility] 08/27/2014, 9:56 AM  LOS: 4 days

## 2014-08-27 NOTE — Anesthesia Preprocedure Evaluation (Addendum)
Anesthesia Evaluation  Patient identified by MRN, date of birth, ID band Patient awake    Reviewed: Allergy & Precautions, NPO status , Patient's Chart, lab work & pertinent test results  History of Anesthesia Complications Negative for: history of anesthetic complications  Airway Mallampati: II  TM Distance: >3 FB Neck ROM: Full  Mouth opening: Limited Mouth Opening  Dental  (+) Dental Advisory Given, Poor Dentition   Pulmonary Current Smoker,  breath sounds clear to auscultation        Cardiovascular hypertension, Pt. on medications and Pt. on home beta blockers - angina+ CAD and + Peripheral Vascular Disease Rhythm:Regular Rate:Normal  08/25/14 ECHO: EF 50%   Neuro/Psych CVA (L sided weakness, slurred speech), Residual Symptoms    GI/Hepatic negative GI ROS, (+) Cirrhosis -    substance abuse  alcohol use, Hepatitis -, C  Endo/Other  diabetes (glu 103), Insulin Dependent  Renal/GU ESRF and DialysisRenal disease (K+ 4.0)     Musculoskeletal   Abdominal   Peds  Hematology  (+) Blood dyscrasia (Hb 8.0, plt 60K, INR 2.09), ,   Anesthesia Other Findings   Reproductive/Obstetrics                           Anesthesia Physical Anesthesia Plan  ASA: IV  Anesthesia Plan: MAC   Post-op Pain Management:    Induction:   Airway Management Planned: Natural Airway and Simple Face Mask  Additional Equipment:   Intra-op Plan:   Post-operative Plan:   Informed Consent: I have reviewed the patients History and Physical, chart, labs and discussed the procedure including the risks, benefits and alternatives for the proposed anesthesia with the patient or authorized representative who has indicated his/her understanding and acceptance.   Dental advisory given  Plan Discussed with: CRNA and Surgeon  Anesthesia Plan Comments: (Plan routine monitors, MAC)        Anesthesia Quick  Evaluation

## 2014-08-27 NOTE — Interval H&P Note (Signed)
History and Physical Interval Note:  08/27/2014 1:16 PM  Greg [name]  has presented today for surgery, with the diagnosis of End Stage Renal Disease N18.6  The various methods of treatment have been discussed with the patient and family. After consideration of risks, benefits and other options for treatment, the patient has consented to  Procedure(s): INSERTION OF DIALYSIS CATHETER (N/A) as a surgical intervention .  The patient's history has been reviewed, patient examined, no change in status, stable for surgery.  I have reviewed the patient's chart and labs.  Questions were answered to the patient's satisfaction.     Josephina Gip

## 2014-08-27 NOTE — Progress Notes (Signed)
Pt seems to cough and aspirate while eating. Notified Dr. Jeannette How concerning need for speech evaluation. RN educated family about sitting pt completely up and allowing time between bites and sips. Pt appears lethargic.   Will continue to monitor.   Greg [name]

## 2014-08-28 ENCOUNTER — Encounter (HOSPITAL_COMMUNITY): Payer: Self-pay | Admitting: Vascular Surgery

## 2014-08-28 DIAGNOSIS — E43 Unspecified severe protein-calorie malnutrition: Secondary | ICD-10-CM | POA: Diagnosis present

## 2014-08-28 LAB — HEPATIC FUNCTION PANEL
ALK PHOS: 121 U/L (ref 38–126)
ALT: 9 U/L — ABNORMAL LOW (ref 17–63)
AST: 21 U/L (ref 15–41)
Albumin: 2 g/dL — ABNORMAL LOW (ref 3.5–5.0)
BILIRUBIN DIRECT: 0.1 mg/dL (ref 0.1–0.5)
BILIRUBIN INDIRECT: 0.8 mg/dL (ref 0.3–0.9)
TOTAL PROTEIN: 6.2 g/dL — AB (ref 6.5–8.1)
Total Bilirubin: 0.9 mg/dL (ref 0.3–1.2)

## 2014-08-28 LAB — GLUCOSE, CAPILLARY: GLUCOSE-CAPILLARY: 103 mg/dL — AB (ref 65–99)

## 2014-08-28 LAB — RENAL FUNCTION PANEL
ANION GAP: 11 (ref 5–15)
Albumin: 1.9 g/dL — ABNORMAL LOW (ref 3.5–5.0)
BUN: 34 mg/dL — ABNORMAL HIGH (ref 6–20)
CHLORIDE: 99 mmol/L — AB (ref 101–111)
CO2: 26 mmol/L (ref 22–32)
Calcium: 8.5 mg/dL — ABNORMAL LOW (ref 8.9–10.3)
Creatinine, Ser: 5.21 mg/dL — ABNORMAL HIGH (ref 0.61–1.24)
GFR calc non Af Amer: 10 mL/min — ABNORMAL LOW (ref >60)
GFR, EST AFRICAN AMERICAN: 12 mL/min — AB (ref >60)
Glucose, Bld: 151 mg/dL — ABNORMAL HIGH (ref 65–99)
POTASSIUM: 3.6 mmol/L (ref 3.5–5.1)
Phosphorus: 2.5 mg/dL (ref 2.5–4.6)
Sodium: 136 mmol/L (ref 135–145)

## 2014-08-28 LAB — LIPASE, BLOOD: LIPASE: 22 U/L (ref 22–51)

## 2014-08-28 LAB — PREPARE FRESH FROZEN PLASMA

## 2014-08-28 MED ORDER — LORAZEPAM 2 MG/ML IJ SOLN
1.0000 mg | Freq: Once | INTRAMUSCULAR | Status: DC
Start: 2014-08-28 — End: 2014-08-29
  Filled 2014-08-28 (×2): qty 1

## 2014-08-28 MED ORDER — SODIUM CHLORIDE 0.9 % IV SOLN
Freq: Once | INTRAVENOUS | Status: AC
  Administered 2014-08-28: 17:00:00 via INTRAVENOUS

## 2014-08-28 MED ORDER — HEPARIN SODIUM (PORCINE) 1000 UNIT/ML DIALYSIS
40.0000 [IU]/kg | INTRAMUSCULAR | Status: DC | PRN
  Filled 2014-08-28: qty 3

## 2014-08-28 MED ORDER — POLYETHYLENE GLYCOL 3350 17 G PO PACK
17.0000 g | PACK | Freq: Every day | ORAL | Status: DC
  Filled 2014-08-28 (×2): qty 1

## 2014-08-28 MED ORDER — PANTOPRAZOLE SODIUM 40 MG IV SOLR
40.0000 mg | Freq: Two times a day (BID) | INTRAVENOUS | Status: DC
  Administered 2014-08-28 (×2): 40 mg via INTRAVENOUS
  Filled 2014-08-28 (×5): qty 40

## 2014-08-28 MED ORDER — SODIUM CHLORIDE 0.9 % IV SOLN
500.0000 mg | INTRAVENOUS | Status: DC
  Administered 2014-08-29 – 2014-08-31 (×3): 500 mg via INTRAVENOUS
  Filled 2014-08-28 (×8): qty 0.5

## 2014-08-28 NOTE — Anesthesia Postprocedure Evaluation (Signed)
  Anesthesia Post-op Note  Patient: Greg [name]  Procedure(s) Performed: Procedure(s): INSERTION OF DIALYSIS CATHETER RIGHT INTERNAL JUGULAR VEIN (Right)  Patient Location: PACU  Anesthesia Type:General  Level of Consciousness: awake  Airway and Oxygen Therapy: Patient Spontanous Breathing  Post-op Pain: mild  Post-op Assessment: Post-op Vital signs reviewed LLE Motor Response: Purposeful movement, Responds to commands   RLE Motor Response: Purposeful movement, Responds to commands        Post-op Vital Signs: Reviewed  Last Vitals:  Filed Vitals:   08/27/14 2017  BP: 168/58  Pulse: 61  Resp: 18    Complications: No apparent anesthesia complications

## 2014-08-28 NOTE — Progress Notes (Signed)
UR Completed. Grazia Taffe, RN, BSN.  [phone] 

## 2014-08-28 NOTE — Progress Notes (Signed)
        Diatek placed yesterday for HD.  Dr. Darrick Penna performed plication of left upper extremity AV fistula on 08/04/2014 wait 2 more weeks before trying to stick the fistula thanks  Niemah Schwebke MAUREEN PA-C

## 2014-08-28 NOTE — Evaluation (Signed)
Clinical/Bedside Swallow Evaluation Patient Details  Name: Greg [name] MRN: [id number] Date of Birth: [birth year]  Today's Date: 08/28/2014 Time: SLP Start Time (ACUTE ONLY): 1108 SLP Stop Time (ACUTE ONLY): 1139 SLP Time Calculation (min) (ACUTE ONLY): 31 min  Past Medical History:  Past Medical History  Diagnosis Date  . ESRD on hemodialysis     Started HD around 2006.   Marland Kitchen Cirrhosis   . Alcohol abuse   . Stroke     left sided deficits  . Hypertension   . Diabetes mellitus     type 2   . Hepatitis     hep c  . Constipation   . Coronary artery disease   . Hypercholesterolemia    Past Surgical History:  Past Surgical History  Procedure Laterality Date  . Cataract extraction w/ intraocular lens implant    . Av fistula placement Left 2006    upper arm  . Peripheral vascular catheterization N/A 06/06/2014    Procedure: Abdominal Aortogram;  Surgeon: Sherren Kerns, MD;  Location: [facility] INVASIVE CV LAB;  Service: Cardiovascular;  Laterality: N/A;  . Lower extremity angiogram Bilateral 06/06/2014    Procedure: Lower Extremity Angiogram;  Surgeon: Sherren Kerns, MD;  Location: [facility] INVASIVE CV LAB;  Service: Cardiovascular;  Laterality: Bilateral;  . Peripheral vascular catheterization Left 06/06/2014    Procedure: Peripheral Vascular Intervention;  Surgeon: Sherren Kerns, MD;  Location: [facility] INVASIVE CV LAB;  Service: Cardiovascular;  Laterality: Left;  pta drug coated x3  . Revison of arteriovenous fistula Left 08/04/2014    Procedure: PLICATION OF  LEFT UPPER ARM ARTERIOVENOUS FISTULA;  Surgeon: Sherren Kerns, MD;  Location: [facility] OR;  Service: Vascular;  Laterality: Left;  . Insertion of dialysis catheter Right 08/04/2014    Procedure: INSERTION OF DIALYSIS CATHETER RIGHT INTERNAL JUGULAR VEIN;  Surgeon: Sherren Kerns, MD;  Location: [facility] OR;  Service: Vascular;  Laterality: Right;  . Insertion of dialysis catheter Right 08/27/2014    Procedure: INSERTION OF DIALYSIS CATHETER RIGHT  INTERNAL JUGULAR VEIN;  Surgeon: Pryor Ochoa, MD;  Location: [facility] OR;  Service: Vascular;  Laterality: Right;   HPI:  69yo male presenting today for fever, vomiting. MD noted toe is black. Pt decompensated in ER, central line placed. Found to have A-fib, sepsis due to E-coli associated with dialysis cath. PMH: storke, ETOH abuse, cirrhosis, DM ESRD. Underwent insertion of dialysis cath 8/17. CXR 8/17 Mild pulmonary vascular congestion. CXR 8/14 Superimposed mild patchy and linear right basilar opacity, which may reflect atelectasis or possible infiltrate.No ST notes found in EPIC. Daughter-on-law reports coughing with liquids at home just prior to this admission.   Assessment / Plan / Recommendation Clinical Impression  Pt initially adamently refusing food/beverage, however daughter-in-law effective to persaude pt. Suspect aspiration/penetration with immediate (intermittent ) cough following thin and delayed cough after honey thick and puree. Daughter-in-law reported dysphagia began just prior to this admission. Facial grimace during swallow possibly due to several hours intubation 8/17. Unclear etiology of dysphagia (hx stroke, intubation but short term). Decreased appetite possibly due to abdominal pain. Family agrees pt would not likely participate in an MBS due to frequent refusal of treatment. Discussed with Dr. David Stall who states pt will remain NPO due to abdominal isssue and will initiate SLP's recommendation of Dys 3 and honey thick liquids once cleared for po's. MD has consulted Palliative care.      Aspiration Risk  Moderate    Diet Recommendation  (NPO d/t abdomnal pain)  Other  Recommendations Oral Care Recommendations: Oral care BID   Follow Up Recommendations       Frequency and Duration min 2x/week  2 weeks   Pertinent Vitals/Pain none      Swallow Study           Oral/Motor/Sensory Function Overall Oral Motor/Sensory Function:  (pt uncooperative)   Ice  Chips Ice chips: Not tested   Thin Liquid Thin Liquid: Impaired Presentation: Cup Oral Phase Impairments:  (none) Pharyngeal  Phase Impairments: Multiple swallows;Cough - Immediate    Nectar Thick Nectar Thick Liquid: Not tested   Honey Thick Honey Thick Liquid: Impaired Presentation: Cup Oral Phase Impairments:  (none) Pharyngeal Phase Impairments: Cough - Delayed;Multiple swallows   Puree Puree: Impaired Pharyngeal Phase Impairments: Cough - Delayed;Multiple swallows   Solid   GO    Solid:  (appeared WFL)       Greg [name] 08/28/2014,11:55 AM   Greg Coons Lonell Face.Ed [employer] [phone]

## 2014-08-28 NOTE — Progress Notes (Signed)
Pt refusing dialysis in the hospital. States he will only go to his dialysis center. MD made aware.  Sandrea Hammond RN

## 2014-08-28 NOTE — Progress Notes (Signed)
Subjective:  Complains of vague lower abdominal pain localized without associated nausea vomiting diarrhea or constipation. Refusing for hemodialysis. Denies any chest pain or shortness of breath. Remains in sinus rhythm  Objective:  Vital Signs in the last 24 hours: Temp:  [97 F (36.1 C)-98.2 F (36.8 C)] 97.5 F (36.4 C) (08/17 2017) Pulse Rate:  [56-61] 61 (08/17 2017) Resp:  [6-18] 18 (08/17 2017) BP: (142-178)/(47-58) 168/58 mmHg (08/17 2017) SpO2:  [100 %] 100 % (08/17 2017)  Intake/Output from previous day: 08/17 0701 - 08/18 0700 In: 280 [I.V.:250; Blood:30] Out: 15 [Blood:15] Intake/Output from this shift:    Physical Exam: Neck: no adenopathy, no carotid bruit, no JVD and supple, symmetrical, trachea midline Lungs: Decreased breath sound at bases Heart: regular rate and rhythm, S1, S2 normal and Soft systolic murmur noted Abdomen: soft, non-tender; bowel sounds normal; no masses,  no organomegaly Extremities: extremities normal, atraumatic, no cyanosis or edema  Lab Results:  Recent Labs  08/26/14 0517 08/27/14 0442  WBC 8.0 6.3  HGB 8.3* 8.0*  PLT 82* 60*    Recent Labs  08/26/14 0517 08/27/14 0442  NA 137 134*  K 3.7 4.0  CL 99* 99*  CO2 28 25  GLUCOSE 107* 103*  BUN 20 31*  CREATININE 3.47* 4.59*    Recent Labs  08/26/14 0517  TROPONINI 2.20*   Hepatic Function Panel No results for input(s): PROT, ALBUMIN, AST, ALT, ALKPHOS, BILITOT, BILIDIR, IBILI in the last 72 hours. No results for input(s): CHOL in the last 72 hours. No results for input(s): PROTIME in the last 72 hours.  Imaging: Imaging results have been reviewed and Dg Chest Port 1 View  08/27/2014   CLINICAL DATA:  Status post dialysis catheter insertion  EXAM: PORTABLE CHEST - 1 VIEW  COMPARISON:  08/23/2014  FINDINGS: Mild bilateral interstitial prominence. No focal consolidation, pleural effusion or pneumothorax. Stable cardiomegaly. Thoracic aortic atherosclerosis. Left jugular  central venous catheter with the tip projecting over the SVC. Dual lumen right-sided dialysis catheter.  IMPRESSION: 1. Right-sided dual-lumen dialysis catheter in.  No pneumothorax. 2. Mild pulmonary vascular congestion.   Electronically Signed   By: Elige Ko   On: 08/27/2014 15:38   Dg Fluoro Guide Cv Line-no Report  08/27/2014   CLINICAL DATA:    FLOURO GUIDE CV LINE  Fluoroscopy was utilized by the requesting physician.  No radiographic  interpretation.     Cardiac Studies:  Assessment/Plan:  Status post Acute small non-Q-wave myocardial infarction probably secondary to demand ischemia/hypotension rule out progression of CAD Multivessel CAD history of silent inferoposterior wall MI in the past with occluded RCA and left circumflex as above Escherichia coli bacteremia probably secondary to urosepsis/ catheter related Status post hypoglycemic shock Status post New-onset A. 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

## 2014-08-28 NOTE — Progress Notes (Signed)
[facility] PROGRESS NOTE Interim History: 69 year old male with past medical history of end-stage renal disease with a dietetic cath they ESBL Escherichia coli bacteremia status post removal of his dialysis catheter. Start her empirically on IV antibiotics bank's and Elita Quick now D escalated to imipenem. With elevated troponins in the setting of new onset of atrial fibrillation, which cardiology was consulted and recommended medical management currently on Plavix and amiodarone. With diabetic foot ulcer with dry gangrene.   Assessment/Plan: Sepsis due to ESBL Escherichia coli associated with dialysis catheter and arterial hypotension: Started empirically on vancomycin and Fortaz changed to imipenem and sensitivities of blood cultures, urine culture also grew ESBL. Catheter tip cultures negative to date repeat blood cultures on 08/26/2014 have remained negative. I will change his antibiotic regimen to meropenem after HD.  Elevated troponin in the setting of new onset atrial fibrillation: Cardiology is following, recommended medical management. Clear by vascular surgery can start oral anticoagulation. Currently on amiodarone and Plavix per cardiology, 2-D echo showed an ejection fraction of 45-50% with severe hypokinesia of the basal and mid inferior myocardium. Consult GI for evaluation for anticoagulation.  End-stage renal disease with dialysis Tuesday Thursday and Saturdays: Nephrology was consulted, dialysis catheter was removed on 08/25/2014. HD catheter  replacement on 08/27/2014. AP fistula is not mature.  Anemia/thrombocytopenia: Mild trend downwards of hemoglobin and platelets platelets have remained greater than 50,000 and hemoglobin above 8. I will continue to monitor it.  Controlled diabetes mellitus type 2: Continue long-acting insulin plus sliding scale.  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 Awaiting recommendations and possible amputation.  Severe deconditioning: Physical therapy evaluation is pending.  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  Toxic metabolic encephalopathy: Likely due to sepsis and hypoglycemia, EEGs pending.  Abdominal pain: - check lipase, and LFT's. - NPO protonix VI BID. - consult GI.  Code Status: full Family Communication: son's  Disposition Plan: home in 4-5 days   Consultants:  Vascular  Renal  Procedures:  Dialysis catheter replacement on 08/27/2014  2-D echo that showed an EF of 45%  Antibiotics:  Imipenem  HPI/Subjective: Complaining of abd pain. Nauseated and vomiting  Objective: Filed Vitals:   08/27/14 1530 08/27/14 1545 08/27/14 1639 08/27/14 2017  BP: 162/57 166/52 159/53 168/58  Pulse: 58 56  61  Temp: 97.7 F (36.5 C) 97.7 F (36.5 C) 97.5 F (36.4 C) 97.5 F (36.4 C)  TempSrc:   Oral Oral  Resp: Weight:      SpO2: 100% 100%  100%    Intake/Output Summary (Last 24 hours) at 08/28/14 0844 Last data filed at 08/27/14 1700  Gross per 24 hour  Intake    280 ml  Output     15 ml  Net    265 ml   Filed Weights   08/25/14 1050 08/26/14 0457 08/27/14 0426  Weight: 64.1 kg (141 lb 5 oz) 67.042 kg (147 lb 12.8 oz) 65.545 kg (144 lb 8 oz)    Exam:  General: Alert, awake, oriented x2, in no acute distress.  HEENT: No bruits, no goiter.  Heart: Regular rate and rhythm. Lungs: Good air movement, clear Abdomen: Soft, epigastric tenderness nondistended, positive bowel sounds.  Neuro: Grossly intact, nonfocal.   Data Reviewed: Basic Metabolic Panel:  Recent Labs Lab 08/23/14 1934 08/23/14 2227 08/24/14 0448 08/25/14 0230 08/26/14 1610  08/27/14 0442  NA 137  --  136 134* 137 134*  K 3.1*  --  3.7 4.0 3.7 4.0  CL 96*  --  99* 95* 99* 99*  CO2 28  --  27 28 28 25   GLUCOSE 118*  --  169* 143* 107* 103*    BUN 11  --  14 22* 20 31*  CREATININE 3.52*  --  3.56* 4.22* 3.47* 4.59*  CALCIUM 8.6*  --  8.3* 9.6 9.3 8.6*  MG  --  1.0* 2.1 2.6*  --   --   PHOS  --   --  3.2 3.6  --   --    Liver Function Tests:  Recent Labs Lab 08/23/14 1934 08/24/14 2330  AST 42* 47*  ALT 10* 11*  ALKPHOS 168* 125  BILITOT 0.8 1.0  PROT 6.2* 6.1*  ALBUMIN 2.1* 1.8*    Recent Labs Lab 08/24/14 2330  LIPASE 14*   No results for input(s): AMMONIA in the last 168 hours. CBC:  Recent Labs Lab 08/23/14 1934 08/24/14 0448 08/25/14 0230 08/26/14 0517 08/27/14 0442  WBC 6.0 16.0* 12.3* 8.0 6.3  NEUTROABS 5.8  --   --  6.3  --   HGB 8.9* 9.0* 8.5* 8.3* 8.0*  HCT 28.3* 27.8* 26.0* 25.1* 25.2*  MCV 86.0 86.6 83.9 85.1 85.1  PLT 77* 68* 68* 82* 60*   Cardiac Enzymes:  Recent Labs Lab 08/23/14 2227 08/24/14 0427 08/24/14 1050 08/25/14 0230 08/26/14 0517  TROPONINI 0.32* 1.13* 2.56* 3.21* 2.20*   BNP (last 3 results) No results for input(s): BNP in the last 8760 hours.  ProBNP (last 3 results) No results for input(s): PROBNP in the last 8760 hours.  CBG:  Recent Labs Lab 08/27/14 1217 08/27/14 1319 08/27/14 1439 08/27/14 1615 08/27/14 2026  GLUCAP 104* 103* 92 106* 96    Recent Results (from the past 240 hour(s))  Culture, blood (routine x 2)     Status: None   Collection Time: 08/23/14  7:36 PM  Result Value Ref Range Status   Specimen Description BLOOD RIGHT ANTECUBITAL  Final   Special Requests BOTTLES DRAWN AEROBIC AND ANAEROBIC 5CC   Final   Culture  Setup Time   Final    GRAM NEGATIVE RODS IN BOTH AEROBIC AND ANAEROBIC BOTTLES CRITICAL RESULT CALLED TO, READ BACK BY AND VERIFIED WITH: P CLAUDIO 08/24/14 @1035  M VESTAL    Culture   Final    ESCHERICHIA COLI Confirmed Extended Spectrum Beta-Lactamase Producer (ESBL)    Report Status 08/26/2014 FINAL  Final   Organism ID, Bacteria ESCHERICHIA COLI  Final      Susceptibility   Escherichia coli - MIC*    AMPICILLIN  >=32 RESISTANT Resistant     CEFAZOLIN >=64 RESISTANT Resistant     CEFEPIME 8 RESISTANT Resistant     CEFTAZIDIME 16 RESISTANT Resistant     CEFTRIAXONE >=64 RESISTANT Resistant     CIPROFLOXACIN >=4 RESISTANT Resistant     GENTAMICIN >=16 RESISTANT Resistant     IMIPENEM <=0.25 SENSITIVE Sensitive     TRIMETH/SULFA >=320 RESISTANT Resistant     AMPICILLIN/SULBACTAM >=32 RESISTANT Resistant     PIP/TAZO 8 SENSITIVE Sensitive     * ESCHERICHIA COLI  Culture, blood (routine x 2)     Status: None   Collection Time: 08/23/14  8:04 PM  Result Value Ref Range Status   Specimen Description BLOOD RIGHT HAND  Final   Special Requests BOTTLES DRAWN AEROBIC AND ANAEROBIC 4CC  Final   Culture  Setup Time   Final    GRAM NEGATIVE RODS CRITICAL RESULT CALLED TO, READ BACK BY AND VERIFIED WITH: T PHILLIPS 08/24/14 @ 1208 M VESTAL IN BOTH AEROBIC AND ANAEROBIC BOTTLES CONFIRMED BY K WOOTEN    Culture   Final    ESCHERICHIA COLI SUSCEPTIBILITIES PERFORMED ON PREVIOUS CULTURE WITHIN THE LAST 5 DAYS.    Report Status 08/26/2014 FINAL  Final  Urine culture     Status: None   Collection Time: 08/23/14  9:13 PM  Result Value Ref Range Status   Specimen Description URINE, CATHETERIZED  Final   Special Requests NONE  Final   Culture   Final    >=100,000 COLONIES/mL ESCHERICHIA COLI Confirmed Extended Spectrum Beta-Lactamase Producer (ESBL)    Report Status 08/26/2014 FINAL  Final   Organism ID, Bacteria ESCHERICHIA COLI  Final      Susceptibility   Escherichia coli - MIC*    AMPICILLIN >=32 RESISTANT Resistant     CEFAZOLIN >=64 RESISTANT Resistant     CEFTRIAXONE >=64 RESISTANT Resistant     CIPROFLOXACIN >=4 RESISTANT Resistant     GENTAMICIN >=16 RESISTANT Resistant     IMIPENEM <=0.25 SENSITIVE Sensitive     NITROFURANTOIN <=16 SENSITIVE Sensitive     TRIMETH/SULFA >=320 RESISTANT Resistant     AMPICILLIN/SULBACTAM >=32 RESISTANT Resistant     PIP/TAZO 8 SENSITIVE Sensitive     *  >=100,000 COLONIES/mL ESCHERICHIA COLI  MRSA PCR Screening     Status: None   Collection Time: 08/24/14  2:26 AM  Result Value Ref Range Status   MRSA by PCR NEGATIVE NEGATIVE Final    Comment:        The GeneXpert MRSA Assay (FDA approved for NASAL specimens only), is one component of a comprehensive MRSA colonization surveillance program. It is not intended to diagnose MRSA infection nor to guide or monitor treatment for MRSA infections.   Cath Tip Culture     Status: None (Preliminary result)   Collection Time: 08/25/14 12:12 PM  Result Value Ref Range Status   Specimen Description CATH TIP HEMODIALYSIS CATHETER  Final   Special Requests NONE  Final   Culture   Final    Culture reincubated for better growth Performed at [facility]    Report Status PENDING  Incomplete  Culture, blood (routine x 2)     Status: None (Preliminary result)   Collection Time: 08/26/14  2:45 PM  Result Value Ref Range Status   Specimen Description BLOOD RIGHT ANTECUBITAL  Final   Special Requests BOTTLES DRAWN AEROBIC AND ANAEROBIC 10CC  Final   Culture NO GROWTH < 24 HOURS  Final   Report Status PENDING  Incomplete  Culture, blood (routine x 2)     Status: None (Preliminary result)   Collection Time: 08/26/14  3:00 PM  Result Value Ref Range Status   Specimen Description BLOOD RIGHT HAND  Final   Special Requests BOTTLES DRAWN AEROBIC AND ANAEROBIC 10CC  Final   Culture NO GROWTH < 24 HOURS  Final   Report Status PENDING  Incomplete     Studies: Dg Chest Port 1 View  08/27/2014   CLINICAL DATA:  Status post dialysis catheter insertion  EXAM: PORTABLE CHEST - 1 VIEW  COMPARISON:  08/23/2014  FINDINGS: Mild bilateral interstitial prominence. No focal consolidation, pleural effusion or pneumothorax. Stable cardiomegaly. Thoracic aortic atherosclerosis. Left jugular central venous catheter with the tip projecting over the SVC. Dual lumen right-sided dialysis catheter.  IMPRESSION: 1.  Right-sided dual-lumen dialysis catheter in.  No pneumothorax. 2. Mild pulmonary vascular congestion.   Electronically Signed   By: Elige Ko   On: 08/27/2014 15:38   Dg Fluoro Guide Cv Line-no Report  08/27/2014   CLINICAL DATA:    FLOURO GUIDE CV LINE  Fluoroscopy was utilized by the requesting physician.  No radiographic  interpretation.     Scheduled Meds: . sodium chloride   Intravenous Once  . amiodarone  200 mg Oral Daily  . atorvastatin  40 mg Oral QHS  . clopidogrel  75 mg Oral QODAY  . imipenem-cilastatin  250 mg Intravenous Q12H  . insulin aspart  0-9 Units Subcutaneous 6 times per day  . lanthanum  500 mg Oral TID WC  . LORazepam  1 mg Intravenous Once  . sodium chloride  10-40 mL Intracatheter Q12H   Continuous Infusions:    Time Spent: 25 min   Marinda Elk  [facility] Pager [phone]. If 7PM-7AM, please contact night-coverage at [email or web address], password [facility] 08/28/2014, 8:44 AM  LOS: 5 days

## 2014-08-28 NOTE — Progress Notes (Signed)
Pt returned to unit from dialysis. Report received. Pt has no complaints. VSS. Blood sugar stable. Pt resting comfortably. Will continue to monitor closely.  Sandrea Hammond RN

## 2014-08-28 NOTE — Consult Note (Signed)
Referring Provider: Dr. David Stall Primary Care Physician:  Rinaldo Cloud, MD Primary Gastroenterologist:  Gentry Fitz  Reason for Consultation:  Abdominal pain  HPI: Greg [name] is a 68 y.o. male being seen for a consult due to abdominal pain. He is inconsistent in his history at first saying the abdominal pain started today and later saying it has been going on for a long period of time but cannot tell me how long. His wife does not know the extent of his abdominal pain. His pain is periumbilical and denies epigastric pain. Denies any change in his BMs. Denies melena or hematochezia and denies heartburn, N/V. Admitted for sepsis due to E. Coli infection of dialysis catheter and on IV Abx. He does not recall whether he has had a colonoscopy or endoscopy. On Plavix. History of Hep C cirrhosis.    Past Medical History  Diagnosis Date  . ESRD on hemodialysis     Started HD around 2006.   Marland Kitchen Cirrhosis   . Alcohol abuse   . Stroke     left sided deficits  . Hypertension   . Diabetes mellitus     type 2   . Hepatitis     hep c  . Constipation   . Coronary artery disease   . Hypercholesterolemia     Past Surgical History  Procedure Laterality Date  . Cataract extraction w/ intraocular lens implant    . Av fistula placement Left 2006    upper arm  . Peripheral vascular catheterization N/A 06/06/2014    Procedure: Abdominal Aortogram;  Surgeon: Sherren Kerns, MD;  Location: [facility] INVASIVE CV LAB;  Service: Cardiovascular;  Laterality: N/A;  . Lower extremity angiogram Bilateral 06/06/2014    Procedure: Lower Extremity Angiogram;  Surgeon: Sherren Kerns, MD;  Location: [facility] INVASIVE CV LAB;  Service: Cardiovascular;  Laterality: Bilateral;  . Peripheral vascular catheterization Left 06/06/2014    Procedure: Peripheral Vascular Intervention;  Surgeon: Sherren Kerns, MD;  Location: [facility] INVASIVE CV LAB;  Service: Cardiovascular;  Laterality: Left;  pta drug coated x3  . Revison of  arteriovenous fistula Left 08/04/2014    Procedure: PLICATION OF  LEFT UPPER ARM ARTERIOVENOUS FISTULA;  Surgeon: Sherren Kerns, MD;  Location: [facility] OR;  Service: Vascular;  Laterality: Left;  . Insertion of dialysis catheter Right 08/04/2014    Procedure: INSERTION OF DIALYSIS CATHETER RIGHT INTERNAL JUGULAR VEIN;  Surgeon: Sherren Kerns, MD;  Location: [facility] OR;  Service: Vascular;  Laterality: Right;  . Insertion of dialysis catheter Right 08/27/2014    Procedure: INSERTION OF DIALYSIS CATHETER RIGHT INTERNAL JUGULAR VEIN;  Surgeon: Pryor Ochoa, MD;  Location: [facility] OR;  Service: Vascular;  Laterality: Right;    Prior to Admission medications   Medication Sig Start Date End Date Taking? Authorizing Provider  aspirin 325 MG EC tablet Take 325 mg by mouth daily.   Yes Historical Provider, MD  atorvastatin (LIPITOR) 40 MG tablet Take 40 mg by mouth at bedtime.   Yes Historical Provider, MD  calcium acetate (PHOSLO) 667 MG capsule Take 1,334 mg by mouth 2 (two) times daily with a meal.  03/26/14  Yes Historical Provider, MD  carvedilol (COREG CR) 10 MG 24 hr capsule Take 10 mg by mouth as needed (for high HR). Takes on days he does not have dialysis TUES, THURS AND SAT   Yes Historical Provider, MD  clopidogrel (PLAVIX) 75 MG tablet Take 75 mg by mouth every other day.    Yes Historical  Provider, MD  insulin NPH-insulin regular (HUMULIN 70/30) (70-30) 100 UNIT/ML injection Inject 10 Units into the skin daily.    Yes Historical Provider, MD  naproxen sodium (ANAPROX) 220 MG tablet Take 220 mg by mouth 2 (two) times daily as needed (for pain).   Yes Historical Provider, MD  sorbitol 70 % solution Take 30 mLs by mouth daily as needed (for laxative).  07/17/14  Yes Historical Provider, MD  clindamycin (CLEOCIN) 150 MG capsule Take 3 capsules (450 mg total) by mouth 3 (three) times daily. 04/24/14   Charlestine Night, PA-C  docusate sodium (COLACE) 100 MG capsule Take 1 capsule (100 mg total) by mouth 2  (two) times daily. 07/10/13   Reuben Likes, MD    Scheduled Meds: . sodium chloride   Intravenous Once  . amiodarone  200 mg Oral Daily  . atorvastatin  40 mg Oral QHS  . clopidogrel  75 mg Oral QODAY  . insulin aspart  0-9 Units Subcutaneous 6 times per day  . lanthanum  500 mg Oral TID WC  . LORazepam  1 mg Intravenous Once  . meropenem (MERREM) IV  500 mg Intravenous Q24H  . pantoprazole (PROTONIX) IV  40 mg Intravenous Q12H  . polyethylene glycol  17 g Oral Daily  . sodium chloride  10-40 mL Intracatheter Q12H   Continuous Infusions:  PRN Meds:.sodium chloride, acetaminophen **OR** acetaminophen, alum & mag hydroxide-simeth, ondansetron **OR** ondansetron (ZOFRAN) IV, oxyCODONE, sodium chloride  Allergies as of 08/23/2014  . (No Known Allergies)    Family History  Problem Relation Age of Onset  . Diabetes Mother   . Hypertension Mother   . Diabetes Father   . Hypertension Father     Social History   Social History  . Marital Status: Married    Spouse Name: N/A  . Number of Children: N/A  . Years of Education: N/A   Occupational History  . Not on file.   Social History Main Topics  . Smoking status: Former Smoker    Quit date: 01/10/2006  . Smokeless tobacco: Never Used  . Alcohol Use: No     Comment: Former  . Drug Use: No  . Sexual Activity: Not on file   Other Topics Concern  . Not on file   Social History Narrative    Review of Systems: All negative except as stated above in HPI.  Physical Exam: Vital signs: Filed Vitals:   08/27/14 2017  BP: 168/58  Pulse: 61  Resp: 18   Last BM Date:  (Unknown) General:   Lethargic, no acute distress, well-nourished Head: atraumatic Eyes: anicteric ENT: oropharynx clear Neck: supple, nontender Lungs:  Clear throughout to auscultation.   No wheezes, crackles, or rhonchi. No acute distress. Heart:  Regular rate and rhythm; no murmurs, clicks, rubs,  or gallops. Abdomen: diffusely tender with minimal  guarding, soft, nondistended, +BS  Rectal:  Deferred Ext: no edema Skin: multiple excoriations noted  GI:  Lab Results:  Recent Labs  08/26/14 0517 08/27/14 0442  WBC 8.0 6.3  HGB 8.3* 8.0*  HCT 25.1* 25.2*  PLT 82* 60*   BMET  Recent Labs  08/26/14 0517 08/27/14 0442  NA 137 134*  K 3.7 4.0  CL 99* 99*  CO2 28 25  GLUCOSE 107* 103*  BUN 20 31*  CREATININE 3.47* 4.59*  CALCIUM 9.3 8.6*   LFT  Recent Labs  08/28/14 1200  PROT 6.2*  ALBUMIN 2.0*  AST 21  ALT 9*  ALKPHOS 121  BILITOT 0.9  BILIDIR 0.1  IBILI 0.8   PT/INR  Recent Labs  08/27/14 0442  LABPROT 23.4*  INR 2.09*     Studies/Results: Dg Chest Port 1 View  08/27/2014   CLINICAL DATA:  Status post dialysis catheter insertion  EXAM: PORTABLE CHEST - 1 VIEW  COMPARISON:  08/23/2014  FINDINGS: Mild bilateral interstitial prominence. No focal consolidation, pleural effusion or pneumothorax. Stable cardiomegaly. Thoracic aortic atherosclerosis. Left jugular central venous catheter with the tip projecting over the SVC. Dual lumen right-sided dialysis catheter.  IMPRESSION: 1. Right-sided dual-lumen dialysis catheter in.  No pneumothorax. 2. Mild pulmonary vascular congestion.   Electronically Signed   By: Elige Ko   On: 08/27/2014 15:38   Dg Fluoro Guide Cv Line-no Report  08/27/2014   CLINICAL DATA:    FLOURO GUIDE CV LINE  Fluoroscopy was utilized by the requesting physician.  No radiographic  interpretation.     Impression/Plan: 69 yo with vague periumbilical abdominal pain in the setting of anemia and cirrhosis. No evidence of an active GI bleed. Needs FOBT checked. Will do EGD to look for peptic ulcer disease, vascular ectasias, and arteriovenous malformations. If EGD unrevealing, then f/u as outpt for colonoscopy. Source of anemia likely multifactorial with ESRD contributing to it. If EGD negative, then no further inpt GI workup and defer to cards/primary team when to start anticoagulation for  Afib. NPO p MN. EGD tomorrow morning. Give 1 U FFP.    LOS: 5 days   Aayat Hajjar C.  08/28/2014, 3:52 PM  Pager [phone]  If no answer or after 5 PM call [phone]

## 2014-08-28 NOTE — Progress Notes (Signed)
MD made aware of pt refusing medication and to have labs drawn. Will continue to monitor.  Sandrea Hammond RN

## 2014-08-28 NOTE — Psychosocial Assessment (Signed)
Md aware of Pt refusing medications and physical assessment all day.  1700: pt and family initially refused dialysis,  RN, dialysis nurses and Dr. Allena Katz had to encouraged pt and convinced him of the need to have his dialysis done today.  He finally agreed to go for dialysis, Pt was also started 1 unit of  frozen plasma b4 dialysis.

## 2014-08-28 NOTE — Progress Notes (Signed)
Patient ID: Greg [name], male   DOB: [birth year], 69 y.o.   MRN: [id number]  [facility] Progress Note   Assessment/ Plan:   1. Escherichia coli bacteremia (suspected CRB but the same organism was isolated from his urine culture): Status post catheter holiday and new dialysis catheter placement yesterday. Anti-microbial sensitivities noted for Escherichia coli isolated in blood/urine-resistant to Septra) and ampicillin but sensitive to imipenem and Zosyn-will find out what we can give him at the dialysis center upon discharge. Status post plication of AV fistula that is currently not suitable for cannulation (still healing). 2. ESRD: Without obvious uremic symptoms/volume excess/electrolyte abnormalities-refuses dialysis here at the hospital and insists upon discharge to follow-up at his outpatient center. 3. Anemia: No overt losses, continue to monitor hemoglobin/hematocrit for PRBC triggers 4. CKD-MBD: Resumed phosphorus binders/vitamin D receptor analogues 5. Protein/calorie malnutrition: Secondary to chronic illness/recent surgery and ischemic left foot fifth toe (malnutrition-inflammation complex) Continue renal diet with oral nutritional supplementation for hypoalbuminemia 6. Hypertension: Blood pressures currently acceptable limits, continue to monitor  Subjective:   Reports to have been having abdominal pain overnight from "not eating right". Refuses dialysis in the hospital and insists that he should be discharged so that he can go to his dialysis unit later today.    Objective:   BP 168/58 mmHg  Pulse 61  Temp(Src) 97.5 F (36.4 C) (Oral)  Resp 18  Wt 65.545 kg (144 lb 8 oz)  SpO2 100%  Physical Exam: Gen: Appears to be uncomfortable resting in bed CVS: Pulse regular in rate and rhythm, S1 and S2 normal Resp: Clear to auscultation, no rales/rhonchi Abd: Soft, flat, nontender Ext: No lower extremity edema-status post recent LUA aVF plication. Ischemic left fifth  toe  Labs: BMET  Recent Labs Lab 08/23/14 1934 08/24/14 0448 08/25/14 0230 08/26/14 0517 08/27/14 0442  NA 137 136 134* 137 134*  K 3.1* 3.7 4.0 3.7 4.0  CL 96* 99* 95* 99* 99*  CO2 28 27 28 28 25   GLUCOSE 118* 169* 143* 107* 103*  BUN 11 14 22* 20 31*  CREATININE 3.52* 3.56* 4.22* 3.47* 4.59*  CALCIUM 8.6* 8.3* 9.6 9.3 8.6*  PHOS  --  3.2 3.6  --   --    CBC  Recent Labs Lab 08/23/14 1934 08/24/14 0448 08/25/14 0230 08/26/14 0517 08/27/14 0442  WBC 6.0 16.0* 12.3* 8.0 6.3  NEUTROABS 5.8  --   --  6.3  --   HGB 8.9* 9.0* 8.5* 8.3* 8.0*  HCT 28.3* 27.8* 26.0* 25.1* 25.2*  MCV 86.0 86.6 83.9 85.1 85.1  PLT 77* 68* 68* 82* 60*   Medications:    . sodium chloride   Intravenous Once  . amiodarone  200 mg Oral Daily  . atorvastatin  40 mg Oral QHS  . clopidogrel  75 mg Oral QODAY  . imipenem-cilastatin  250 mg Intravenous Q12H  . insulin aspart  0-9 Units Subcutaneous 6 times per day  . lanthanum  500 mg Oral TID WC  . LORazepam  1 mg Intravenous Once  . sodium chloride  10-40 mL Intracatheter Q12H   Zetta Bills, MD 08/28/2014, 7:46 AM

## 2014-08-28 NOTE — Progress Notes (Addendum)
Pt refusing all medications because he wants to go home. Family at bedside encouraging pt to accept treatment. Will continue to monitor.  Sandrea Hammond RN

## 2014-08-28 NOTE — Progress Notes (Signed)
Pt refused to have blood sugar checked. Pt and family educated. Will continue to monitor.  Sandrea Hammond RN

## 2014-08-28 NOTE — Evaluation (Signed)
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 Patient has long history of declining functional status (at least partly due to family providing too much assistance and not expecting more from pt himself). Pt/family in agreement that he needs to get stronger for them to be able to continue to care for him at home. (They report they can manage for now, but foresee a time soon that they could not if things don't change).  Pt currently with functional limitations due to the deficits listed below (see PT Problem List).  Pt will benefit from skilled PT to increase their independence and safety with mobility to allow discharge to the venue listed below.       Follow Up Recommendations Home health PT;Supervision/Assistance - 24 hour    Equipment Recommendations   hoyer lift; transport chair for getting into bathroom (pt's w/c does not fit into bathroom; family has removed door from frame and still cannot get into bathroom with w/c, they carry pt to toilet and tub!)   Recommendations for Other Services       Precautions / Restrictions Precautions Precautions: Fall      Mobility  Bed Mobility               General bed mobility comments: pt refused  Transfers                    Ambulation/Gait                Stairs            Wheelchair Mobility    Modified Rankin (Stroke Patients Only)       Balance                                             Pertinent Vitals/Pain Pain Assessment: No/denies pain    Home Living Family/patient expects to be discharged to:: Private residence Living Arrangements: Spouse/significant  other;Children Available Help at Discharge: Family;Available 24 hours/day Type of Home: House Home Access: Ramped entrance     Home Layout: Two level;Able to live on main level with bedroom/bathroom Home Equipment: Dan Humphreys - 2 wheels;Wheelchair - manual (sliding board)      Prior Function Level of Independence: Needs assistance   Gait / Transfers Assistance Needed: per sons has not walked in 3 years; he doesn't propel w/c (family pushes); sons pick pt up and put in w/c, car, edge of "jacuzzi type" tub  ADL's / Homemaking Assistance Needed: total assist (they even feed pt)  Comments: sons acknowledge that they all have done too much for patient and enabled him; he no longer bears weight on his legs during transfers     Hand Dominance   Dominant Hand: Right    Extremity/Trunk Assessment   Upper Extremity Assessment: RUE deficits/detail;LUE deficits/detail RUE Deficits / Details: AROM WFL; shoulder 3/5; elbow 3+/5     LUE Deficits / Details: AROM WFL; shoulder 3/5; elbow 3+/5   Lower Extremity Assessment: RLE deficits/detail;LLE deficits/detail RLE Deficits / Details: AROM WFL; hip flexion 3/5; hip extension 3+; knee extension 2+;  ankle DF 2+ LLE Deficits / Details: AAROM WFL; hip flexion 3/5; hip extension 3+; knee extension 2+; ankle DF 2+  Cervical / Trunk Assessment: Other exceptions  Communication   Communication: Prefers language other than English;Other (comment) (spoke some English; family translated)  Cognition Arousal/Alertness: Awake/alert Behavior During Therapy: WFL for tasks assessed/performed Overall Cognitive Status: Impaired/Different from baseline Area of Impairment: Memory;Awareness     Memory: Decreased short-term memory     Awareness: Intellectual   General Comments: confusion improving per son    General Comments General comments (skin integrity, edema, etc.): Lengthy discussion re: role of PT and pt/family goals. Sons are realistic re: gains to  be made and hopeful pt can bear weight to assist with transfers. Pt states he wants to get stronger and sons/wife acknowledge they must not do everything for the pt.    Exercises        Assessment/Plan    PT Assessment Patient needs continued PT services  PT Diagnosis Generalized weakness   PT Problem List Decreased strength;Decreased activity tolerance;Decreased mobility;Decreased knowledge of use of DME  PT Treatment Interventions DME instruction;Functional mobility training;Therapeutic activities;Therapeutic exercise;Balance training;Patient/family education   PT Goals (Current goals can be found in the Care Plan section) Acute Rehab PT Goals Patient Stated Goal: to get stronger PT Goal Formulation: With patient/family Time For Goal Achievement: 09/11/14 Potential to Achieve Goals: Fair    Frequency Min 2X/week   Barriers to discharge        Co-evaluation               End of Session   Activity Tolerance: Patient tolerated treatment well Patient left: in bed;with call bell/phone within reach;with family/visitor present           Time: 1324-4010 PT Time Calculation (min) (ACUTE ONLY): 32 min   Charges:   PT Evaluation $Initial PT Evaluation Tier I: 1 Procedure PT Treatments $Therapeutic Activity: 8-22 mins   PT G Codes:        Greg [name] 2014/09/22, 2:27 PM  Pager [phone]

## 2014-08-28 NOTE — Progress Notes (Signed)
ANTIBIOTIC CONSULT NOTE - INITIAL  Pharmacy Consult for Merrem Indication: ESBL+ Ecoli  No Known Allergies  Patient Measurements: Weight:  (PT. Refused) Adjusted Body Weight:    Vital Signs:   Intake/Output from previous day: 08/17 0701 - 08/18 0700 In: 280 [I.V.:250; Blood:30] Out: 15 [Blood:15] Intake/Output from this shift:    Labs:  Recent Labs  08/26/14 0517 08/27/14 0442  WBC 8.0 6.3  HGB 8.3* 8.0*  PLT 82* 60*  CREATININE 3.47* 4.59*   Estimated Creatinine Clearance: 14.5 mL/min (by C-G formula based on Cr of 4.59). No results for input(s): VANCOTROUGH, VANCOPEAK, VANCORANDOM, GENTTROUGH, GENTPEAK, GENTRANDOM, TOBRATROUGH, TOBRAPEAK, TOBRARND, AMIKACINPEAK, AMIKACINTROU, AMIKACIN in the last 72 hours.   Microbiology: Recent Results (from the past 720 hour(s))  Culture, blood (routine x 2)     Status: None   Collection Time: 08/23/14  7:36 PM  Result Value Ref Range Status   Specimen Description BLOOD RIGHT ANTECUBITAL  Final   Special Requests BOTTLES DRAWN AEROBIC AND ANAEROBIC 5CC   Final   Culture  Setup Time   Final    GRAM NEGATIVE RODS IN BOTH AEROBIC AND ANAEROBIC BOTTLES CRITICAL RESULT CALLED TO, READ BACK BY AND VERIFIED WITH: P CLAUDIO 08/24/14  M VESTAL    Culture   Final    ESCHERICHIA COLI Confirmed Extended Spectrum Beta-Lactamase Producer (ESBL)    Report Status 08/26/2014 FINAL  Final   Organism ID, Bacteria ESCHERICHIA COLI  Final      Susceptibility   Escherichia coli - MIC*    AMPICILLIN >=32 RESISTANT Resistant     CEFAZOLIN >=64 RESISTANT Resistant     CEFEPIME 8 RESISTANT Resistant     CEFTAZIDIME 16 RESISTANT Resistant     CEFTRIAXONE >=64 RESISTANT Resistant     CIPROFLOXACIN >=4 RESISTANT Resistant     GENTAMICIN >=16 RESISTANT Resistant     IMIPENEM <=0.25 SENSITIVE Sensitive     TRIMETH/SULFA >=320 RESISTANT Resistant     AMPICILLIN/SULBACTAM >=32 RESISTANT Resistant     PIP/TAZO 8 SENSITIVE Sensitive     *  ESCHERICHIA COLI  Culture, blood (routine x 2)     Status: None   Collection Time: 08/23/14  8:04 PM  Result Value Ref Range Status   Specimen Description BLOOD RIGHT HAND  Final   Special Requests BOTTLES DRAWN AEROBIC AND ANAEROBIC 4CC   Final   Culture  Setup Time   Final    GRAM NEGATIVE RODS CRITICAL RESULT CALLED TO, READ BACK BY AND VERIFIED WITH: T PHILLIPS 08/24/14 @ 1208 M VESTAL IN BOTH AEROBIC AND ANAEROBIC BOTTLES CONFIRMED BY K WOOTEN    Culture   Final    ESCHERICHIA COLI SUSCEPTIBILITIES PERFORMED ON PREVIOUS CULTURE WITHIN THE LAST 5 DAYS.    Report Status 08/26/2014 FINAL  Final  Urine culture     Status: None   Collection Time: 08/23/14  9:13 PM  Result Value Ref Range Status   Specimen Description URINE, CATHETERIZED  Final   Special Requests NONE  Final   Culture   Final    >=100,000 COLONIES/mL ESCHERICHIA COLI Confirmed Extended Spectrum Beta-Lactamase Producer (ESBL)    Report Status 08/26/2014 FINAL  Final   Organism ID, Bacteria ESCHERICHIA COLI  Final      Susceptibility   Escherichia coli - MIC*    AMPICILLIN >=32 RESISTANT Resistant     CEFAZOLIN >=64 RESISTANT Resistant     CEFTRIAXONE >=64 RESISTANT Resistant     CIPROFLOXACIN >=4 RESISTANT Resistant     GENTAMICIN >=  16 RESISTANT Resistant     IMIPENEM <=0.25 SENSITIVE Sensitive     NITROFURANTOIN <=16 SENSITIVE Sensitive     TRIMETH/SULFA >=320 RESISTANT Resistant     AMPICILLIN/SULBACTAM >=32 RESISTANT Resistant     PIP/TAZO 8 SENSITIVE Sensitive     * >=100,000 COLONIES/mL ESCHERICHIA COLI  MRSA PCR Screening     Status: None   Collection Time: 08/24/14  2:26 AM  Result Value Ref Range Status   MRSA by PCR NEGATIVE NEGATIVE Final    Comment:        The GeneXpert MRSA Assay (FDA approved for NASAL specimens only), is one component of a comprehensive MRSA colonization surveillance program. It is not intended to diagnose MRSA infection nor to guide or monitor treatment for MRSA  infections.   Cath Tip Culture     Status: None (Preliminary result)   Collection Time: 08/25/14 12:12 PM  Result Value Ref Range Status   Specimen Description CATH TIP HEMODIALYSIS CATHETER  Final   Special Requests NONE  Final   Culture   Final    Culture reincubated for better growth Performed at [facility]    Report Status PENDING  Incomplete  Culture, blood (routine x 2)     Status: None (Preliminary result)   Collection Time: 08/26/14  2:45 PM  Result Value Ref Range Status   Specimen Description BLOOD RIGHT ANTECUBITAL  Final   Special Requests BOTTLES DRAWN AEROBIC AND ANAEROBIC 10CC  Final   Culture NO GROWTH < 24 HOURS  Final   Report Status PENDING  Incomplete  Culture, blood (routine x 2)     Status: None (Preliminary result)   Collection Time: 08/26/14  3:00 PM  Result Value Ref Range Status   Specimen Description BLOOD RIGHT HAND  Final   Special Requests BOTTLES DRAWN AEROBIC AND ANAEROBIC 10CC  Final   Culture NO GROWTH < 24 HOURS  Final   Report Status PENDING  Incomplete    Medical History: Past Medical History  Diagnosis Date  . ESRD on hemodialysis     Started HD around 2006.   Marland Kitchen Cirrhosis   . Alcohol abuse   . Stroke     left sided deficits  . Hypertension   . Diabetes mellitus     type 2   . Hepatitis     hep c  . Constipation   . Coronary artery disease   . Hypercholesterolemia    Admit Complaint: fever, chills, nausea, vomiting   Infectious Disease:  #3 Carbapenem for ESBL+ Ecoli bacteremia + left 5th toe gangrene + ESBL+ Ecoli UTI, hx recent MSSA osteo of 3rd toe - Afebrile, WBC 6.3 down,   Vanc 8/13 >> 8/15 Fortaz 8/13 >>8/16 Primaxin 8/16>>8/18 Merrem 8/18>>  8/13 BCx x2 - ESBL+ Ecoli 8/13 UCx -ESBL + Ecoli 8/14 MRSA PCR - negative 8/15: HD cath tip>> 8/16: BC x 2>>  Goal of Therapy:  Eradication of infection  Plan:  Still no HD yet D/c Primaxin Merrem 500mg  IV q24. Give AFTER HD on hemo days.   Chavon Lucarelli S.  Merilynn Finland, PharmD, BCPS Clinical Staff Pharmacist Pager [phone]  Misty Stanley Stillinger 08/28/2014,9:43 AM

## 2014-08-29 ENCOUNTER — Encounter (HOSPITAL_COMMUNITY): Payer: Self-pay | Admitting: *Deleted

## 2014-08-29 ENCOUNTER — Inpatient Hospital Stay (HOSPITAL_COMMUNITY): Payer: Medicare Other | Admitting: Anesthesiology

## 2014-08-29 ENCOUNTER — Encounter (HOSPITAL_COMMUNITY)
Admission: EM | Disposition: A | Discharge status: Home Health | Payer: Self-pay | Source: Home / Self Care | Attending: Internal Medicine

## 2014-08-29 DIAGNOSIS — K269 Duodenal ulcer, unspecified as acute or chronic, without hemorrhage or perforation: Secondary | ICD-10-CM

## 2014-08-29 DIAGNOSIS — R1084 Generalized abdominal pain: Secondary | ICD-10-CM | POA: Clinically undetermined

## 2014-08-29 DIAGNOSIS — K253 Acute gastric ulcer without hemorrhage or perforation: Secondary | ICD-10-CM

## 2014-08-29 DIAGNOSIS — K259 Gastric ulcer, unspecified as acute or chronic, without hemorrhage or perforation: Secondary | ICD-10-CM

## 2014-08-29 DIAGNOSIS — Z515 Encounter for palliative care: Secondary | ICD-10-CM | POA: Insufficient documentation

## 2014-08-29 DIAGNOSIS — E43 Unspecified severe protein-calorie malnutrition: Secondary | ICD-10-CM

## 2014-08-29 DIAGNOSIS — K2971 Gastritis, unspecified, with bleeding: Secondary | ICD-10-CM

## 2014-08-29 HISTORY — PX: ESOPHAGOGASTRODUODENOSCOPY (EGD) WITH PROPOFOL: SHX5813

## 2014-08-29 LAB — GLUCOSE, CAPILLARY
GLUCOSE-CAPILLARY: 87 mg/dL (ref 65–99)
GLUCOSE-CAPILLARY: 91 mg/dL (ref 65–99)
Glucose-Capillary: 158 mg/dL — ABNORMAL HIGH (ref 65–99)
Glucose-Capillary: 124 mg/dL — ABNORMAL HIGH (ref 65–99)
Glucose-Capillary: 105 mg/dL — ABNORMAL HIGH (ref 65–99)
Glucose-Capillary: 91 mg/dL (ref 65–99)

## 2014-08-29 LAB — CBC
HCT: 26.9 % — ABNORMAL LOW (ref 39.0–52.0)
HEMATOCRIT: 24.3 % — AB (ref 39.0–52.0)
HEMOGLOBIN: 7.7 g/dL — AB (ref 13.0–17.0)
Hemoglobin: 8.8 g/dL — ABNORMAL LOW (ref 13.0–17.0)
MCH: 26.9 pg (ref 26.0–34.0)
MCH: 27.7 pg (ref 26.0–34.0)
MCHC: 31.7 g/dL (ref 30.0–36.0)
MCHC: 32.7 g/dL (ref 30.0–36.0)
MCV: 85 fL (ref 78.0–100.0)
MCV: 84.6 fL (ref 78.0–100.0)
PLATELETS: 59 10*3/uL — AB (ref 150–400)
PLATELETS: 55 10*3/uL — AB (ref 150–400)
RBC: 2.86 MIL/uL — AB (ref 4.22–5.81)
RBC: 3.18 MIL/uL — AB (ref 4.22–5.81)
RDW: 16.2 % — ABNORMAL HIGH (ref 11.5–15.5)
RDW: 15.6 % — ABNORMAL HIGH (ref 11.5–15.5)
WBC: 6.4 10*3/uL (ref 4.0–10.5)
WBC: 5.9 10*3/uL (ref 4.0–10.5)

## 2014-08-29 LAB — PREPARE FRESH FROZEN PLASMA

## 2014-08-29 LAB — PREPARE RBC (CROSSMATCH)

## 2014-08-29 SURGERY — ESOPHAGOGASTRODUODENOSCOPY (EGD) WITH PROPOFOL
Anesthesia: Monitor Anesthesia Care

## 2014-08-29 MED ORDER — ALTEPLASE 2 MG IJ SOLR
2.0000 mg | Freq: Once | INTRAMUSCULAR | Status: AC
  Administered 2014-08-29: 2 mg
  Filled 2014-08-29: qty 2

## 2014-08-29 MED ORDER — POLYETHYLENE GLYCOL 3350 17 G PO PACK
17.0000 g | PACK | Freq: Two times a day (BID) | ORAL | Status: DC
  Filled 2014-08-29: qty 1

## 2014-08-29 MED ORDER — FENTANYL CITRATE (PF) 100 MCG/2ML IJ SOLN
INTRAMUSCULAR | Status: DC | PRN
  Administered 2014-08-29 (×2): 50 ug via INTRAVENOUS

## 2014-08-29 MED ORDER — DARBEPOETIN ALFA 200 MCG/0.4ML IJ SOSY
200.0000 ug | PREFILLED_SYRINGE | INTRAMUSCULAR | Status: DC
  Filled 2014-08-29 (×2): qty 0.4

## 2014-08-29 MED ORDER — RENA-VITE PO TABS
1.0000 | ORAL_TABLET | Freq: Every day | ORAL | Status: DC
  Administered 2014-08-29 – 2014-08-30 (×2): 1 via ORAL
  Filled 2014-08-29 (×3): qty 1

## 2014-08-29 MED ORDER — PROPOFOL INFUSION 10 MG/ML OPTIME
INTRAVENOUS | Status: DC | PRN
  Administered 2014-08-29: 75 ug/kg/min via INTRAVENOUS

## 2014-08-29 MED ORDER — LANTHANUM CARBONATE 500 MG PO CHEW
1000.0000 mg | CHEWABLE_TABLET | Freq: Three times a day (TID) | ORAL | Status: DC
  Filled 2014-08-29 (×10): qty 2

## 2014-08-29 MED ORDER — HALOPERIDOL LACTATE 5 MG/ML IJ SOLN
1.0000 mg | Freq: Four times a day (QID) | INTRAMUSCULAR | Status: DC | PRN
  Filled 2014-08-29: qty 0.2

## 2014-08-29 MED ORDER — PANTOPRAZOLE SODIUM 40 MG IV SOLR: 40.0000 mg | Freq: Two times a day (BID) | INTRAVENOUS | Status: DC

## 2014-08-29 MED ORDER — FLUCONAZOLE IN SODIUM CHLORIDE 200-0.9 MG/100ML-% IV SOLN
200.0000 mg | INTRAVENOUS | Status: DC
  Administered 2014-08-29 – 2014-08-31 (×3): 200 mg via INTRAVENOUS
  Filled 2014-08-29 (×3): qty 100

## 2014-08-29 MED ORDER — RESOURCE THICKENUP CLEAR PO POWD
ORAL | Status: DC | PRN
  Filled 2014-08-29: qty 125

## 2014-08-29 MED ORDER — SODIUM CHLORIDE 0.9 % IV SOLN
80.0000 mg | Freq: Once | INTRAVENOUS | Status: AC
  Administered 2014-08-29: 80 mg via INTRAVENOUS
  Filled 2014-08-29: qty 80

## 2014-08-29 MED ORDER — SODIUM CHLORIDE 0.9 % IV SOLN
Freq: Once | INTRAVENOUS | Status: AC
  Administered 2014-08-29: 12:00:00 via INTRAVENOUS

## 2014-08-29 MED ORDER — SODIUM CHLORIDE 0.9 % IV SOLN
8.0000 mg/h | INTRAVENOUS | Status: DC
  Administered 2014-08-29 – 2014-08-30 (×2): 8 mg/h via INTRAVENOUS
  Filled 2014-08-29 (×8): qty 80

## 2014-08-29 MED ORDER — BUTAMBEN-TETRACAINE-BENZOCAINE 2-2-14 % EX AERO
INHALATION_SPRAY | CUTANEOUS | Status: DC | PRN
  Administered 2014-08-29: 2 via TOPICAL

## 2014-08-29 NOTE — Care Management Important Message (Signed)
Important Message  Patient Details  Name: Angeldaniel Pepitone MRN: [id number] Date of Birth: May 10, 1945   Medicare Important Message Given:  Yes-second notification given    Cherylann Parr, RN 08/29/2014, 11:33 AM

## 2014-08-29 NOTE — Transfer of Care (Signed)
Immediate Anesthesia Transfer of Care Note  Patient: Greg [name]  Procedure(s) Performed: Procedure(s): ESOPHAGOGASTRODUODENOSCOPY (EGD) WITH PROPOFOL (N/A)  Patient Location: Endoscopy Unit  Anesthesia Type:MAC  Level of Consciousness: awake, alert  and oriented  Airway & Oxygen Therapy: Patient Spontanous Breathing and Patient connected to nasal cannula oxygen  Post-op Assessment: Report given to RN, Post -op Vital signs reviewed and stable and Patient moving all extremities  Post vital signs: Reviewed and stable  Last Vitals:  Filed Vitals:   08/29/14 0924  BP: 142/44  Pulse: 68  Temp: 36.8 C  Resp: 16    Complications: No apparent anesthesia complications

## 2014-08-29 NOTE — Progress Notes (Addendum)
PT Cancellation Note  Patient Details Name: Greg [name] MRN: [id number] DOB: 1945/01/28   Cancelled Treatment:    Reason Treat Not Completed: Pt's sons are not present to discuss discharge plans. Wife reports they will be back ~3:00 pm. Will attempt to see when sons present to assist with translating and discharge/equipment decisions.  Addendum (15:20) Sons not yet arrived. 8/18 did discuss ?need for [facility] to incr ease of toileting at home and sons declined. Noted insurance has denied transport chair due to pt obtaining wheelchair one year ago (despite wheelchair not fitting in bathroom). Noted Case Manager offered information re: price/purchase transport chair and sons were not interested. Updated PT recommendations continue to include hoyer lift for bed to wheelchair transfers and HHPT.    Nelani Schmelzle 08/29/2014, 2:08 PM  Pager [phone]

## 2014-08-29 NOTE — Anesthesia Preprocedure Evaluation (Signed)
Anesthesia Evaluation    Airway Mallampati: II  TM Distance: >3 FB Neck ROM: Full    Dental no notable dental hx.    Pulmonary former smoker,  breath sounds clear to auscultation  Pulmonary exam normal       Cardiovascular hypertension, Pt. on medications + CAD, + Past MI and + Peripheral Vascular Disease Normal cardiovascular examRhythm:Regular Rate:Normal     Neuro/Psych CVA    GI/Hepatic (+) Hepatitis -  Endo/Other  diabetes  Renal/GU Renal disease     Musculoskeletal   Abdominal   Peds  Hematology   Anesthesia Other Findings   Reproductive/Obstetrics                             Anesthesia Physical  Anesthesia Plan  ASA: III  Anesthesia Plan: MAC   Post-op Pain Management:    Induction: Intravenous  Airway Management Planned:   Additional Equipment:   Intra-op Plan:   Post-operative Plan:   Informed Consent: I have reviewed the patients History and Physical, chart, labs and discussed the procedure including the risks, benefits and alternatives for the proposed anesthesia with the patient or authorized representative who has indicated his/her understanding and acceptance.   Dental advisory given  Plan Discussed with: CRNA  Anesthesia Plan Comments:         Anesthesia Quick Evaluation

## 2014-08-29 NOTE — Interval H&P Note (Signed)
History and Physical Interval Note:  08/29/2014 8:34 AM  Greg [name]  has presented today for surgery, with the diagnosis of abd pian   The various methods of treatment have been discussed with the patient and family. After consideration of risks, benefits and other options for treatment, the patient has consented to  Procedure(s): ESOPHAGOGASTRODUODENOSCOPY (EGD) WITH PROPOFOL (N/A) as a surgical intervention .  The patient's history has been reviewed, patient examined, no change in status, stable for surgery.  I have reviewed the patient's chart and labs.  Questions were answered to the patient's satisfaction.     [name] C.

## 2014-08-29 NOTE — Brief Op Note (Signed)
Severe erosive esophagitis. Gastric ulcer (small) with bleeding stigmata. Hemorrhagic gastritis. See endopro note for details/recs.

## 2014-08-29 NOTE — Anesthesia Postprocedure Evaluation (Signed)
Anesthesia Post Note  Patient: Greg [name]  Procedure(s) Performed: Procedure(s) (LRB): ESOPHAGOGASTRODUODENOSCOPY (EGD) WITH PROPOFOL (N/A)  Anesthesia type: MAC  Patient location: PACU  Post pain: Pain level controlled  Post assessment: Post-op Vital signs reviewed  Last Vitals: BP 160/47 mmHg  Pulse 73  Temp(Src) 36.8 C (Oral)  Resp 16  Ht 5\' 8"  (1.727 m)  Wt 143 lb (64.864 kg)  BMI 21.75 kg/m2  SpO2 100%  Post vital signs: Reviewed  Level of consciousness: awake  Complications: No apparent anesthesia complications

## 2014-08-29 NOTE — Care Management Note (Addendum)
Case Management Note  Patient Details  Name: Luccas Strycker MRN: [id number] Date of Birth: 12/23/1945  Subjective/Objective:      Pt admitted with sepsis              Action/Plan:  Pt is from home with family, family is at bedside translating as pt speaks minimum english.  CM will continue to monitor for disposition needs   Expected Discharge Date:                  Expected Discharge Plan:  Home/Self Care  In-House Referral:     Discharge planning Services  CM Consult  Post Acute Care Choice:    Choice offered to:     DME Arranged:   [facility]) DME Agency:     HH Arranged:  PT HH Agency:  [facility]  Status of Service:  In process, will continue to follow  Medicare Important Message Given:  Yes-second notification given Date Medicare IM Given:    Medicare IM give by:    Date Additional Medicare IM Given:    Additional Medicare Important Message give by:     If discussed at Long Length of Stay Meetings, dates discussed:    Additional Comments: CM requested HH and DME orders from MD during progression rounds.  CM assessed pt, wife and son at bedside.  CM offered choice, pts son chose [facility] for DME and [facility], agency contacted and referral accepted.  Pt can not get approval via insurance for wheelchair/transport as pt received one a year ago per agency.  Agency discussed specifics with purchasing wheelchair, family is not interested.  Family is also not interested in SNF for pt, states they have been lifting pt at home and will continue to do so, MD and PT are aware.  PT recommended bedside commode to pt and family, pt and family refused.  Family will provide recommended supervision. Cherylann Parr, RN 08/29/2014, 1:41 PM

## 2014-08-29 NOTE — Progress Notes (Signed)
Spoke to Dr. Robb Matar about potential dc plans 08-30-14. Have an appt with son, Mr. Kyall Wesby in the morning, 08-30-14 at 1100 for GOC discussion and what to anticipate going forward for Mr. Pinnow. Thank you, Eduard Roux, ANP Palliative Medicine Team

## 2014-08-29 NOTE — H&P (View-Only) (Signed)
Referring Provider: Dr. David Stall Primary Care Physician:  Rinaldo Cloud, MD Primary Gastroenterologist:  Gentry Fitz  Reason for Consultation:  Abdominal pain  HPI: Greg [name] is a 69 y.o. male being seen for a consult due to abdominal pain. He is inconsistent in his history at first saying the abdominal pain started today and later saying it has been going on for a long period of time but cannot tell me how long. His wife does not know the extent of his abdominal pain. His pain is periumbilical and denies epigastric pain. Denies any change in his BMs. Denies melena or hematochezia and denies heartburn, N/V. Admitted for sepsis due to E. Coli infection of dialysis catheter and on IV Abx. He does not recall whether he has had a colonoscopy or endoscopy. On Plavix. History of Hep C cirrhosis.    Past Medical History  Diagnosis Date  . ESRD on hemodialysis     Started HD around 2006.   Marland Kitchen Cirrhosis   . Alcohol abuse   . Stroke     left sided deficits  . Hypertension   . Diabetes mellitus     type 2   . Hepatitis     hep c  . Constipation   . Coronary artery disease   . Hypercholesterolemia     Past Surgical History  Procedure Laterality Date  . Cataract extraction w/ intraocular lens implant    . Av fistula placement Left 2006    upper arm  . Peripheral vascular catheterization N/A 06/06/2014    Procedure: Abdominal Aortogram;  Surgeon: Sherren Kerns, MD;  Location: [facility] INVASIVE CV LAB;  Service: Cardiovascular;  Laterality: N/A;  . Lower extremity angiogram Bilateral 06/06/2014    Procedure: Lower Extremity Angiogram;  Surgeon: Sherren Kerns, MD;  Location: [facility] INVASIVE CV LAB;  Service: Cardiovascular;  Laterality: Bilateral;  . Peripheral vascular catheterization Left 06/06/2014    Procedure: Peripheral Vascular Intervention;  Surgeon: Sherren Kerns, MD;  Location: [facility] INVASIVE CV LAB;  Service: Cardiovascular;  Laterality: Left;  pta drug coated x3  . Revison of  arteriovenous fistula Left 08/04/2014    Procedure: PLICATION OF  LEFT UPPER ARM ARTERIOVENOUS FISTULA;  Surgeon: Sherren Kerns, MD;  Location: [facility] OR;  Service: Vascular;  Laterality: Left;  . Insertion of dialysis catheter Right 08/04/2014    Procedure: INSERTION OF DIALYSIS CATHETER RIGHT INTERNAL JUGULAR VEIN;  Surgeon: Sherren Kerns, MD;  Location: [facility] OR;  Service: Vascular;  Laterality: Right;  . Insertion of dialysis catheter Right 08/27/2014    Procedure: INSERTION OF DIALYSIS CATHETER RIGHT INTERNAL JUGULAR VEIN;  Surgeon: Pryor Ochoa, MD;  Location: [facility] OR;  Service: Vascular;  Laterality: Right;    Prior to Admission medications   Medication Sig Start Date End Date Taking? Authorizing Provider  aspirin 325 MG EC tablet Take 325 mg by mouth daily.   Yes Historical Provider, MD  atorvastatin (LIPITOR) 40 MG tablet Take 40 mg by mouth at bedtime.   Yes Historical Provider, MD  calcium acetate (PHOSLO) 667 MG capsule Take 1,334 mg by mouth 2 (two) times daily with a meal.  03/26/14  Yes Historical Provider, MD  carvedilol (COREG CR) 10 MG 24 hr capsule Take 10 mg by mouth as needed (for high HR). Takes on days he does not have dialysis TUES, THURS AND SAT   Yes Historical Provider, MD  clopidogrel (PLAVIX) 75 MG tablet Take 75 mg by mouth every other day.    Yes Historical  Provider, MD  insulin NPH-insulin regular (HUMULIN 70/30) (70-30) 100 UNIT/ML injection Inject 10 Units into the skin daily.    Yes Historical Provider, MD  naproxen sodium (ANAPROX) 220 MG tablet Take 220 mg by mouth 2 (two) times daily as needed (for pain).   Yes Historical Provider, MD  sorbitol 70 % solution Take 30 mLs by mouth daily as needed (for laxative).  07/17/14  Yes Historical Provider, MD  clindamycin (CLEOCIN) 150 MG capsule Take 3 capsules (450 mg total) by mouth 3 (three) times daily. 04/24/14   Charlestine Night, PA-C  docusate sodium (COLACE) 100 MG capsule Take 1 capsule (100 mg total) by mouth 2  (two) times daily. 07/10/13   Reuben Likes, MD    Scheduled Meds: . sodium chloride   Intravenous Once  . amiodarone  200 mg Oral Daily  . atorvastatin  40 mg Oral QHS  . clopidogrel  75 mg Oral QODAY  . insulin aspart  0-9 Units Subcutaneous 6 times per day  . lanthanum  500 mg Oral TID WC  . LORazepam  1 mg Intravenous Once  . meropenem (MERREM) IV  500 mg Intravenous Q24H  . pantoprazole (PROTONIX) IV  40 mg Intravenous Q12H  . polyethylene glycol  17 g Oral Daily  . sodium chloride  10-40 mL Intracatheter Q12H   Continuous Infusions:  PRN Meds:.sodium chloride, acetaminophen **OR** acetaminophen, alum & mag hydroxide-simeth, ondansetron **OR** ondansetron (ZOFRAN) IV, oxyCODONE, sodium chloride  Allergies as of 08/23/2014  . (No Known Allergies)    Family History  Problem Relation Age of Onset  . Diabetes Mother   . Hypertension Mother   . Diabetes Father   . Hypertension Father     Social History   Social History  . Marital Status: Married    Spouse Name: N/A  . Number of Children: N/A  . Years of Education: N/A   Occupational History  . Not on file.   Social History Main Topics  . Smoking status: Former Smoker    Quit date: 01/10/2006  . Smokeless tobacco: Never Used  . Alcohol Use: No     Comment: Former  . Drug Use: No  . Sexual Activity: Not on file   Other Topics Concern  . Not on file   Social History Narrative    Review of Systems: All negative except as stated above in HPI.  Physical Exam: Vital signs: Filed Vitals:   08/27/14 2017  BP: 168/58  Pulse: 61  Resp: 18   Last BM Date:  (Unknown) General:   Lethargic, no acute distress, well-nourished Head: atraumatic Eyes: anicteric ENT: oropharynx clear Neck: supple, nontender Lungs:  Clear throughout to auscultation.   No wheezes, crackles, or rhonchi. No acute distress. Heart:  Regular rate and rhythm; no murmurs, clicks, rubs,  or gallops. Abdomen: diffusely tender with minimal  guarding, soft, nondistended, +BS  Rectal:  Deferred Ext: no edema Skin: multiple excoriations noted  GI:  Lab Results:  Recent Labs  08/26/14 0517 08/27/14 0442  WBC 8.0 6.3  HGB 8.3* 8.0*  HCT 25.1* 25.2*  PLT 82* 60*   BMET  Recent Labs  08/26/14 0517 08/27/14 0442  NA 137 134*  K 3.7 4.0  CL 99* 99*  CO2 28 25  GLUCOSE 107* 103*  BUN 20 31*  CREATININE 3.47* 4.59*  CALCIUM 9.3 8.6*   LFT  Recent Labs  08/28/14 1200  PROT 6.2*  ALBUMIN 2.0*  AST 21  ALT 9*  ALKPHOS 121  BILITOT 0.9  BILIDIR 0.1  IBILI 0.8   PT/INR  Recent Labs  08/27/14 0442  LABPROT 23.4*  INR 2.09*     Studies/Results: Dg Chest Port 1 View  08/27/2014   CLINICAL DATA:  Status post dialysis catheter insertion  EXAM: PORTABLE CHEST - 1 VIEW  COMPARISON:  08/23/2014  FINDINGS: Mild bilateral interstitial prominence. No focal consolidation, pleural effusion or pneumothorax. Stable cardiomegaly. Thoracic aortic atherosclerosis. Left jugular central venous catheter with the tip projecting over the SVC. Dual lumen right-sided dialysis catheter.  IMPRESSION: 1. Right-sided dual-lumen dialysis catheter in.  No pneumothorax. 2. Mild pulmonary vascular congestion.   Electronically Signed   By: Elige Ko   On: 08/27/2014 15:38   Dg Fluoro Guide Cv Line-no Report  08/27/2014   CLINICAL DATA:    FLOURO GUIDE CV LINE  Fluoroscopy was utilized by the requesting physician.  No radiographic  interpretation.     Impression/Plan: 69 yo with vague periumbilical abdominal pain in the setting of anemia and cirrhosis. No evidence of an active GI bleed. Needs FOBT checked. Will do EGD to look for peptic ulcer disease, vascular ectasias, and arteriovenous malformations. If EGD unrevealing, then f/u as outpt for colonoscopy. Source of anemia likely multifactorial with ESRD contributing to it. If EGD negative, then no further inpt GI workup and defer to cards/primary team when to start anticoagulation for  Afib. NPO p MN. EGD tomorrow morning. Give 1 U FFP.    LOS: 5 days   Bader Stubblefield C.  08/28/2014, 3:52 PM  Pager [phone]  If no answer or after 5 PM call [phone]

## 2014-08-29 NOTE — Op Note (Signed)
[facility] [address] [place], [address]   ENDOSCOPY PROCEDURE REPORT  PATIENT: Greg [name], Greg [name]  MR#: [id number] BIRTHDATE: 08-30-1945 , 68  yrs. old GENDER: male ENDOSCOPIST: Charlott Rakes, MD REFERRED BY: PROCEDURE DATE:  September 24, 2014 PROCEDURE:  EGD, diagnostic with brushing ASA CLASS:     Class III INDICATIONS:  abdominal pain. MEDICATIONS: Propofol, Per Anesthesia , and Monitored anesthesia care TOPICAL ANESTHETIC:  DESCRIPTION OF PROCEDURE: After the risks benefits and alternatives of the procedure were thoroughly explained, informed consent was obtained.  The [employer] GASTOROSCOPE [id number] endoscope was introduced through the mouth and advanced to the second portion of the duodenum , Without limitations.  The instrument was slowly withdrawn as the mucosa was fully examined. Estimated blood loss is zero unless otherwise noted in this procedure report.    Severe distal erosive esophagitis with circumferential ulcers, edema, erythema, and friability. GEJ 35 cm from the incisors. Clear fluid in stomach. A 8 mm superficial antral ulcer seen with a small black flat spot noted in the ulcer without active bleeding. Small clean9-based duodenal ulcer seen. 2nd portion of duodenum normal. Brushing down of esophagitis to check for Candida. Retroflexed views revealed hemorrhagic gastritis seen in focal area in fundus and cardia. A small hiatal hernia also noted.     The scope was then withdrawn from the patient and the procedure completed.  COMPLICATIONS: There were no immediate complications.  ENDOSCOPIC IMPRESSION:     Severe erosive esophagitis - s/p brushing to rule out Candida Gastric ulcer with bleeding stigmata Hemorrhagic Gastritis Duodenal ulcer Small hiatal hernia  RECOMMENDATIONS:     Protonix drip; IV Diflucan for empiric treatment for Candida esophagitis; Sips of water and ice chips; Check H. pylori serology and treat if positive; F/U  on brushing   eSigned:  Charlott Rakes, MD September 24, 2014 9:24 AM    CC:  CPT CODES: ICD CODES:  The ICD and CPT codes recommended by this software are interpretations from the data that the clinical staff has captured with the software.  The verification of the translation of this report to the ICD and CPT codes and modifiers is the sole responsibility of the health care institution and practicing physician where this report was generated.  [employer] will not be held responsible for the validity of the ICD and CPT codes included on this report.  AMA assumes no liability for data contained or not contained herein. CPT is a Publishing rights manager of the [employer].  PATIENT NAME:  Greg [name], Greg [name] MR#: [id number]

## 2014-08-29 NOTE — Anesthesia Procedure Notes (Signed)
Procedure Name: MAC Date/Time: 08/29/2014 9:00 AM Performed by: Orvilla Fus A Pre-anesthesia Checklist: Emergency Drugs available, Patient identified, Patient being monitored, Suction available and Timeout performed Patient Re-evaluated:Patient Re-evaluated prior to inductionOxygen Delivery Method: Nasal cannula Placement Confirmation: positive ETCO2 Dental Injury: Teeth and Oropharynx as per pre-operative assessment

## 2014-08-29 NOTE — Progress Notes (Signed)
Subjective: Interval History: has no complaint.  Objective: Vital signs in last 24 hours: Temp:  [97.5 F (36.4 C)-98 F (36.7 C)] 98 F (36.7 C) (08/19 0536) Pulse Rate:  [77-91] 85 (08/19 0536) Resp:  [16-20] 20 (08/19 0536) BP: (125-194)/(52-82) 168/74 mmHg (08/19 0536) SpO2:  [100 %] 100 % (08/19 0536) Weight:  [65.3 kg (143 lb 15.4 oz)-66.8 kg (147 lb 4.3 oz)] 65.3 kg (143 lb 15.4 oz) (08/19 7096) Weight change:   Intake/Output from previous day: 08/18 0701 - 08/19 0700 In: 54 [Blood:54] Out: 2000  Intake/Output this shift:    General appearance: cooperative, cachectic and pale Resp: diminished breath sounds bilaterally Chest wall: RIJ cath Cardio: S1, S2 normal and cont M at ULSB GI: soft,liver down 5 cm,soft Extremities: LUA AVF B&T, 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 Iron Studies: No results for input(s): IRON, TIBC, TRANSFERRIN, FERRITIN in the last 72 hours.  Studies/Results: Dg Chest Port 1 View  08/27/2014   CLINICAL DATA:  Status post dialysis catheter insertion  EXAM: PORTABLE CHEST - 1 VIEW  COMPARISON:  08/23/2014  FINDINGS: Mild bilateral interstitial prominence. No focal consolidation, pleural effusion or pneumothorax. Stable cardiomegaly. Thoracic aortic atherosclerosis. Left jugular central venous catheter with the tip projecting over the SVC. Dual lumen right-sided dialysis catheter.  IMPRESSION: 1. Right-sided dual-lumen dialysis catheter in.  No pneumothorax. 2. Mild pulmonary vascular congestion.   Electronically Signed   By: Elige Ko   On: 08/27/2014 15:38   Dg Fluoro Guide Cv Line-no  Report  08/27/2014   CLINICAL DATA:    FLOURO GUIDE CV LINE  Fluoroscopy was utilized by the requesting physician.  No radiographic  interpretation.     I have reviewed the patient's current medications.  Assessment/Plan: 1 ESRD for HD TTS, lower vol 2 HTN fair not opt 3 DM controlled 4 Anemia needs ESA 5HPTH vitD 6 Ecoli sepis Urine vs cath . Cath changed 7 Access use AVF in 2 wk 8 Malnutrition suppl 9 PVD P HD Sat, AB, arranging of [facility] at [facility], ESA, DM control    LOS: 6 days   Greg [name] L 08/29/2014,7:25 AM

## 2014-08-29 NOTE — Progress Notes (Addendum)
[facility] PROGRESS NOTE Interim History: 69 year old male with past medical history of end-stage renal disease with a dietetic cath they ESBL Escherichia coli bacteremia status post removal of his dialysis catheter. Start her empirically on IV antibiotics bank's and Elita Quick now D escalated to imipenem. With elevated troponins in the setting of new onset of atrial fibrillation, which cardiology was consulted and recommended medical management currently on Plavix and amiodarone. With diabetic foot ulcer with dry gangrene. Started complaining of epigastric pain he was started on empiric Protonix and gastroneurology was consulted who recommended an EGD.   Assessment/Plan: Sepsis due to ESBL Escherichia coli associated with dialysis catheter and arterial hypotension: Now on meropenem after HD, will need to be treated for 2 weeks, starting on 08/26/2014 Catheter tip cultures negative to date repeat blood cultures on 08/26/2014 have remained negative.  Elevated troponin in the setting of new onset atrial fibrillation: Cardiology recommended medical management.  Abdominal pain due to erosive gastritis hemorrhagic gastritis and gastric ulcer along with duodenal ulcer: He was started empirically on IV Protonix GI was consulted and recommended an endoscopy that show severe erosive gastritis with bleeding stigma, possible hemorrhagic gastritis, with gastric ulcer with bleeding stigma and duodenal ulcer. GI also recommended IV Diflucan. Plavix was held.  End-stage renal disease with dialysis Tuesday Thursday and Saturdays: Nephrology was consulted, dialysis catheter was removed on 08/25/2014. HD catheter  replacement on 08/27/2014. AP fistula is not mature.  Anemia/thrombocytopenia: Hemoglobin continues to drop with findings at EGD I will go ahead and transfuse him 1 unit of packed red blood cells. 2 CBC tomorrow morning.  Controlled diabetes mellitus type 2: Continue long-acting insulin  plus sliding scale.  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  Severe deconditioning: Physical therapy evaluation is pending.  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  Toxic metabolic encephalopathy: Likely due to sepsis and now resolved.  Code Status: full Family Communication: son's  Disposition Plan: home in am   Consultants:  Vascular  Renal  Procedures:  Dialysis catheter replacement on 08/27/2014  2-D echo that showed an EF of 45%  Antibiotics:  Meropenem  HPI/Subjective: Abdominal pain improved.  Objective: Filed Vitals:   08/29/14 0924 08/29/14 0925 08/29/14 0930 08/29/14 0935  BP: 142/44   160/47  Pulse: 68 66 65 73  Temp: 98.2 F (36.8 C)     TempSrc: Oral     Resp: 16 17 11 16   Height:      Weight:      SpO2: 100% 100% 100% 100%    Intake/Output Summary (Last 24 hours) at 08/29/14 1027 Last data filed at 08/29/14 0912  Gross per 24 hour  Intake    154 ml  Output   2000 ml  Net  -1846 ml   Filed Weights   08/28/14 1804 08/29/14 0712 08/29/14 0813  Weight: 66.8 kg (147 lb 4.3 oz) 65.3 kg (143 lb 15.4 oz) 64.864 kg (143 lb)    Exam:  General: Alert, awake, oriented x2, in no acute distress.  HEENT: No bruits, no goiter.  Heart: Regular rate and rhythm. Lungs: Good air movement, clear Abdomen: Soft, epigastric tenderness nondistended, positive bowel sounds.  Neuro: Grossly intact, nonfocal.   Data Reviewed: Basic Metabolic Panel:  Recent Labs Lab 08/23/14 2227 08/24/14 0448 08/25/14 0230 08/26/14 0517 08/27/14 0442 08/28/14 1837  NA  --  136 134* 137 134* 136  K  --  3.7 4.0 3.7 4.0 3.6  CL  --  99* 95* 99* 99* 99*  CO2  --  GLUCOSE  --  169* 143* 107* 103* 151*  BUN  --  14 22* 20 31* 34*  CREATININE  --  3.56* 4.22* 3.47* 4.59* 5.21*  CALCIUM  --  8.3* 9.6 9.3 8.6* 8.5*  MG 1.0* 2.1  2.6*  --   --   --   PHOS  --  3.2 3.6  --   --  2.5   Liver Function Tests:  Recent Labs Lab 08/23/14 1934 08/24/14 2330 08/28/14 1200 08/28/14 1837  AST 42* 47* 21  --   ALT 10* 11* 9*  --   ALKPHOS 168* 125 121  --   BILITOT 0.8 1.0 0.9  --   PROT 6.2* 6.1* 6.2*  --   ALBUMIN 2.1* 1.8* 2.0* 1.9*    Recent Labs Lab 08/24/14 2330 08/28/14 1200  LIPASE 14* 22   No results for input(s): AMMONIA in the last 168 hours. CBC:  Recent Labs Lab 08/23/14 1934 08/24/14 0448 08/25/14 0230 08/26/14 0517 08/27/14 0442 08/29/14 0646  WBC 6.0 16.0* 12.3* 8.0 6.3 6.4  NEUTROABS 5.8  --   --  6.3  --   --   HGB 8.9* 9.0* 8.5* 8.3* 8.0* 7.7*  HCT 28.3* 27.8* 26.0* 25.1* 25.2* 24.3*  MCV 86.0 86.6 83.9 85.1 85.1 85.0  PLT 77* 68* 68* 82* 60* 59*   Cardiac Enzymes:  Recent Labs Lab 08/23/14 2227 08/24/14 0427 08/24/14 1050 08/25/14 0230 08/26/14 0517  TROPONINI 0.32* 1.13* 2.56* 3.21* 2.20*   BNP (last 3 results) No results for input(s): BNP in the last 8760 hours.  ProBNP (last 3 results) No results for input(s): PROBNP in the last 8760 hours.  CBG:  Recent Labs Lab 08/27/14 1615 08/27/14 2026 08/28/14 2224 08/29/14 0024 08/29/14 0527  GLUCAP 106* 96 103* 87 158*    Recent Results (from the past 240 hour(s))  Culture, blood (routine x 2)     Status: None   Collection Time: 08/23/14  7:36 PM  Result Value Ref Range Status   Specimen Description BLOOD RIGHT ANTECUBITAL  Final   Special Requests BOTTLES DRAWN AEROBIC AND ANAEROBIC 5CC   Final   Culture  Setup Time   Final    GRAM NEGATIVE RODS IN BOTH AEROBIC AND ANAEROBIC BOTTLES CRITICAL RESULT CALLED TO, READ BACK BY AND VERIFIED WITH: P CLAUDIO 08/24/14  M VESTAL    Culture   Final    ESCHERICHIA COLI Confirmed Extended Spectrum Beta-Lactamase Producer (ESBL)    Report Status 08/26/2014 FINAL  Final   Organism ID, Bacteria ESCHERICHIA COLI  Final      Susceptibility   Escherichia coli -  MIC*    AMPICILLIN >=32 RESISTANT Resistant     CEFAZOLIN >=64 RESISTANT Resistant     CEFEPIME 8 RESISTANT Resistant     CEFTAZIDIME 16 RESISTANT Resistant     CEFTRIAXONE >=64 RESISTANT Resistant     CIPROFLOXACIN >=4 RESISTANT Resistant     GENTAMICIN >=16 RESISTANT Resistant     IMIPENEM <=0.25 SENSITIVE Sensitive     TRIMETH/SULFA >=320 RESISTANT Resistant     AMPICILLIN/SULBACTAM >=32 RESISTANT Resistant     PIP/TAZO 8 SENSITIVE Sensitive     * ESCHERICHIA COLI  Culture, blood (routine x 2)     Status: None   Collection Time: 08/23/14  8:04 PM  Result Value  Ref Range Status   Specimen Description BLOOD RIGHT HAND  Final   Special Requests BOTTLES DRAWN AEROBIC AND ANAEROBIC 4CC   Final   Culture  Setup Time   Final    GRAM NEGATIVE RODS CRITICAL RESULT CALLED TO, READ BACK BY AND VERIFIED WITH: T PHILLIPS 08/24/14 @ 1208 M VESTAL IN BOTH AEROBIC AND ANAEROBIC BOTTLES CONFIRMED BY K WOOTEN    Culture   Final    ESCHERICHIA COLI SUSCEPTIBILITIES PERFORMED ON PREVIOUS CULTURE WITHIN THE LAST 5 DAYS.    Report Status 08/26/2014 FINAL  Final  Urine culture     Status: None   Collection Time: 08/23/14  9:13 PM  Result Value Ref Range Status   Specimen Description URINE, CATHETERIZED  Final   Special Requests NONE  Final   Culture   Final    >=100,000 COLONIES/mL ESCHERICHIA COLI Confirmed Extended Spectrum Beta-Lactamase Producer (ESBL)    Report Status 08/26/2014 FINAL  Final   Organism ID, Bacteria ESCHERICHIA COLI  Final      Susceptibility   Escherichia coli - MIC*    AMPICILLIN >=32 RESISTANT Resistant     CEFAZOLIN >=64 RESISTANT Resistant     CEFTRIAXONE >=64 RESISTANT Resistant     CIPROFLOXACIN >=4 RESISTANT Resistant     GENTAMICIN >=16 RESISTANT Resistant     IMIPENEM <=0.25 SENSITIVE Sensitive     NITROFURANTOIN <=16 SENSITIVE Sensitive     TRIMETH/SULFA >=320 RESISTANT Resistant     AMPICILLIN/SULBACTAM >=32 RESISTANT Resistant     PIP/TAZO 8 SENSITIVE  Sensitive     * >=100,000 COLONIES/mL ESCHERICHIA COLI  MRSA PCR Screening     Status: None   Collection Time: 08/24/14  2:26 AM  Result Value Ref Range Status   MRSA by PCR NEGATIVE NEGATIVE Final    Comment:        The GeneXpert MRSA Assay (FDA approved for NASAL specimens only), is one component of a comprehensive MRSA colonization surveillance program. It is not intended to diagnose MRSA infection nor to guide or monitor treatment for MRSA infections.   Cath Tip Culture     Status: None (Preliminary result)   Collection Time: 08/25/14 12:12 PM  Result Value Ref Range Status   Specimen Description CATH TIP HEMODIALYSIS CATHETER  Final   Special Requests NONE  Final   Culture   Final    Culture reincubated for better growth Performed at [facility]    Report Status PENDING  Incomplete  Culture, blood (routine x 2)     Status: None (Preliminary result)   Collection Time: 08/26/14  2:45 PM  Result Value Ref Range Status   Specimen Description BLOOD RIGHT ANTECUBITAL  Final   Special Requests BOTTLES DRAWN AEROBIC AND ANAEROBIC 10CC  Final   Culture NO GROWTH 2 DAYS  Final   Report Status PENDING  Incomplete  Culture, blood (routine x 2)     Status: None (Preliminary result)   Collection Time: 08/26/14  3:00 PM  Result Value Ref Range Status   Specimen Description BLOOD RIGHT HAND  Final   Special Requests BOTTLES DRAWN AEROBIC AND ANAEROBIC 10CC  Final   Culture NO GROWTH 2 DAYS  Final   Report Status PENDING  Incomplete     Studies: Dg Chest Port 1 View  08/27/2014   CLINICAL DATA:  Status post dialysis catheter insertion  EXAM: PORTABLE CHEST - 1 VIEW  COMPARISON:  08/23/2014  FINDINGS: Mild bilateral interstitial prominence. No focal consolidation, pleural effusion or pneumothorax.  Stable cardiomegaly. Thoracic aortic atherosclerosis. Left jugular central venous catheter with the tip projecting over the SVC. Dual lumen right-sided dialysis catheter.   IMPRESSION: 1. Right-sided dual-lumen dialysis catheter in.  No pneumothorax. 2. Mild pulmonary vascular congestion.   Electronically Signed   By: Elige Ko   On: 08/27/2014 15:38   Dg Fluoro Guide Cv Line-no Report  08/27/2014   CLINICAL DATA:    FLOURO GUIDE CV LINE  Fluoroscopy was utilized by the requesting physician.  No radiographic  interpretation.     Scheduled Meds: . sodium chloride   Intravenous Once  . amiodarone  200 mg Oral Daily  . atorvastatin  40 mg Oral QHS  . clopidogrel  75 mg Oral QODAY  . [START ON 08/30/2014] darbepoetin (ARANESP) injection - DIALYSIS  200 mcg Intravenous Q Sat-HD  . insulin aspart  0-9 Units Subcutaneous 6 times per day  . lanthanum  1,000 mg Oral TID WC  . LORazepam  1 mg Intravenous Once  . meropenem (MERREM) IV  500 mg Intravenous Q24H  . multivitamin  1 tablet Oral QHS  . pantoprazole (PROTONIX) IV  40 mg Intravenous Q12H  . polyethylene glycol  17 g Oral Daily  . sodium chloride  10-40 mL Intracatheter Q12H   Continuous Infusions:    Time Spent: 25 min   Marinda Elk  [facility] Pager [phone]. If 7PM-7AM, please contact night-coverage at [email or web address], password [facility] 08/29/2014, 10:27 AM  LOS: 6 days

## 2014-08-30 DIAGNOSIS — R1084 Generalized abdominal pain: Secondary | ICD-10-CM

## 2014-08-30 DIAGNOSIS — A4151 Sepsis due to Escherichia coli [E. coli]: Secondary | ICD-10-CM

## 2014-08-30 DIAGNOSIS — Z515 Encounter for palliative care: Secondary | ICD-10-CM

## 2014-08-30 LAB — CBC
HEMATOCRIT: 27.3 % — AB (ref 39.0–52.0)
HEMOGLOBIN: 8.8 g/dL — AB (ref 13.0–17.0)
MCH: 27.3 pg (ref 26.0–34.0)
MCHC: 32.2 g/dL (ref 30.0–36.0)
MCV: 84.8 fL (ref 78.0–100.0)
Platelets: 71 10*3/uL — ABNORMAL LOW (ref 150–400)
RBC: 3.22 MIL/uL — ABNORMAL LOW (ref 4.22–5.81)
RDW: 15.8 % — AB (ref 11.5–15.5)
WBC: 6.1 10*3/uL (ref 4.0–10.5)

## 2014-08-30 LAB — RENAL FUNCTION PANEL
ALBUMIN: 1.8 g/dL — AB (ref 3.5–5.0)
ANION GAP: 9 (ref 5–15)
BUN: 16 mg/dL (ref 6–20)
CALCIUM: 8.7 mg/dL — AB (ref 8.9–10.3)
CO2: 28 mmol/L (ref 22–32)
Chloride: 100 mmol/L — ABNORMAL LOW (ref 101–111)
Creatinine, Ser: 4.07 mg/dL — ABNORMAL HIGH (ref 0.61–1.24)
GFR calc non Af Amer: 14 mL/min — ABNORMAL LOW (ref >60)
GFR, EST AFRICAN AMERICAN: 16 mL/min — AB (ref >60)
Glucose, Bld: 86 mg/dL (ref 65–99)
PHOSPHORUS: 3.2 mg/dL (ref 2.5–4.6)
POTASSIUM: 3.2 mmol/L — AB (ref 3.5–5.1)
SODIUM: 137 mmol/L (ref 135–145)

## 2014-08-30 LAB — GLUCOSE, CAPILLARY
GLUCOSE-CAPILLARY: 119 mg/dL — AB (ref 65–99)
GLUCOSE-CAPILLARY: 175 mg/dL — AB (ref 65–99)
Glucose-Capillary: 73 mg/dL (ref 65–99)
Glucose-Capillary: 88 mg/dL (ref 65–99)
Glucose-Capillary: 99 mg/dL (ref 65–99)

## 2014-08-30 LAB — TYPE AND SCREEN
ABO/RH(D): A POS
ANTIBODY SCREEN: NEGATIVE
Unit division: 0

## 2014-08-30 MED ORDER — CLOPIDOGREL BISULFATE 75 MG PO TABS: 75.0000 mg | ORAL_TABLET | ORAL | Status: AC

## 2014-08-30 MED ORDER — SODIUM CHLORIDE 0.9 % IV SOLN: 100.0000 mL | INTRAVENOUS | Status: DC | PRN

## 2014-08-30 MED ORDER — NEPRO/CARBSTEADY PO LIQD: 237.0000 mL | ORAL | Status: DC | PRN

## 2014-08-30 MED ORDER — POLYETHYLENE GLYCOL 3350 17 G PO PACK: 17.0000 g | PACK | Freq: Every day | ORAL | Status: DC | PRN

## 2014-08-30 MED ORDER — AMIODARONE HCL 200 MG PO TABS: 200.0000 mg | ORAL_TABLET | Freq: Every day | ORAL | Status: DC

## 2014-08-30 MED ORDER — CLOPIDOGREL BISULFATE 75 MG PO TABS: 75.0000 mg | ORAL_TABLET | ORAL | Status: DC

## 2014-08-30 MED ORDER — PANTOPRAZOLE SODIUM 40 MG PO TBEC: 40.0000 mg | DELAYED_RELEASE_TABLET | Freq: Two times a day (BID) | ORAL | Status: DC

## 2014-08-30 MED ORDER — HEPARIN SODIUM (PORCINE) 1000 UNIT/ML DIALYSIS: 1000.0000 [IU] | INTRAMUSCULAR | Status: DC | PRN

## 2014-08-30 MED ORDER — LANTHANUM CARBONATE 1000 MG PO CHEW: 1000.0000 mg | CHEWABLE_TABLET | Freq: Three times a day (TID) | ORAL | Status: AC

## 2014-08-30 MED ORDER — OXYCODONE HCL 5 MG PO TABS: 5.0000 mg | ORAL_TABLET | ORAL | Status: DC | PRN

## 2014-08-30 MED ORDER — LIDOCAINE-PRILOCAINE 2.5-2.5 % EX CREA: 1.0000 "application " | TOPICAL_CREAM | CUTANEOUS | Status: DC | PRN

## 2014-08-30 MED ORDER — SODIUM CHLORIDE 0.9 % IV SOLN: 500.0000 mg | INTRAVENOUS | Status: DC

## 2014-08-30 MED ORDER — DARBEPOETIN ALFA 200 MCG/0.4ML IJ SOSY
200.0000 ug | PREFILLED_SYRINGE | Freq: Once | INTRAMUSCULAR | Status: AC
  Administered 2014-08-30: 200 ug via SUBCUTANEOUS
  Filled 2014-08-30: qty 0.4

## 2014-08-30 MED ORDER — NEPRO/CARBSTEADY PO LIQD
237.0000 mL | ORAL | Status: DC | PRN
Start: 2014-08-30 — End: 2014-10-21

## 2014-08-30 MED ORDER — LIDOCAINE HCL (PF) 1 % IJ SOLN: 5.0000 mL | INTRAMUSCULAR | Status: DC | PRN

## 2014-08-30 MED ORDER — HEPARIN SODIUM (PORCINE) 1000 UNIT/ML DIALYSIS: 100.0000 [IU]/kg | INTRAMUSCULAR | Status: DC | PRN

## 2014-08-30 MED ORDER — PENTAFLUOROPROP-TETRAFLUOROETH EX AERO: 1.0000 "application " | INHALATION_SPRAY | CUTANEOUS | Status: DC | PRN

## 2014-08-30 MED ORDER — FLUCONAZOLE 100 MG PO TABS: 100.0000 mg | ORAL_TABLET | Freq: Every day | ORAL | Status: DC

## 2014-08-30 MED ORDER — ALTEPLASE 2 MG IJ SOLR
2.0000 mg | Freq: Once | INTRAMUSCULAR | Status: DC | PRN
  Filled 2014-08-30: qty 2

## 2014-08-30 NOTE — Procedures (Signed)
I was present at this session.  I have reviewed the session itself and made appropriate changes.  HD via PC.  Cath funtion ok, bp ^ to start,lower vol  Greg [name] L 8/20/20169:57 AM

## 2014-08-30 NOTE — Progress Notes (Signed)
Subjective: Interval History: has no complaint .  Objective: Vital signs in last 24 hours: Temp:  [97.3 F (36.3 C)-98.4 F (36.9 C)] 97.6 F (36.4 C) (08/20 0432) Pulse Rate:  [63-73] 64 (08/20 0445) Resp:  [11-20] 20 (08/20 0432) BP: (142-189)/(44-76) 154/51 mmHg (08/20 0445) SpO2:  [97 %-100 %] 100 % (08/20 0432) Weight:  [64.864 kg (143 lb)-65.3 kg (143 lb 15.4 oz)] 64.864 kg (143 lb) (08/19 0813) Weight change: -1.5 kg (-3 lb 4.9 oz)  Intake/Output from previous day: 08/19 0701 - 08/20 0700 In: 671 [I.V.:200; Blood:321; IV Piggyback:150] Out: -  Intake/Output this shift:    General appearance: cooperative, cachectic and pale Resp: diminished breath sounds bilaterally Chest wall: RIJcath Cardio: S1, S2 normal and cont M ULSB GI: soft, liver down 5 cm, pos bs Extremities: AVF LUA B&T, gangrene L 5th toe, ischemic changes L foot, punctate lesions on legs  Lab Results:  Recent Labs  08/29/14 0646 08/29/14 1857  WBC 6.4 5.9  HGB 7.7* 8.8*  HCT 24.3* 26.9*  PLT 59* 55*   BMET:  Recent Labs  08/28/14 1837  NA 136  K 3.6  CL 99*  CO2 26  GLUCOSE 151*  BUN 34*  CREATININE 5.21*  CALCIUM 8.5*   No results for input(s): PTH in the last 72 hours. Iron Studies: No results for input(s): IRON, TIBC, TRANSFERRIN, FERRITIN in the last 72 hours.  Studies/Results: No results found.  I have reviewed the patient's current medications.  Assessment/Plan: 1 ESRD for HD 2 HTN lower vol todlay 3 E coli sepsis cath change, ? Urine sourc 4 Gastritis Hb stable 5 Anemia ESA/Fe 6 HPHT meds 7 PVD with gangrene,  ??? Amp 8 malnutrition suppl 9 Debill outlook grim P HD, ESA, suppl, PPI, getting Merum for [facility], Needs EOL discussions    LOS: 7 days   Axzel Rockhill L 08/30/2014,6:58 AM

## 2014-08-30 NOTE — Progress Notes (Signed)
Cm received ETA of equipment will be tomorrow 08/31/14.  Son (english speaking) will receive hospital bed and hoyer lift between 8-10am.  HH services arranged with [facility].  Pt's insurance will not cover wheelchair.  No other CM needs were communicated.

## 2014-08-30 NOTE — Consult Note (Signed)
Consultation Note Date: 08/30/2014   Patient Name: Greg [name]  DOB: 01/08/1946  MRN: [id number]  Age / Sex: 69 y.o., male   PCP: Charolette Forward, MD Referring Physician: Charlynne Cousins, MD  Reason for Consultation: Establishing goals of care  Palliative Care Assessment and Plan Summary of Established Goals of Care and Medical Treatment Preferences   Clinical Assessment/Narrative: Pt is a 69 yo Greg [name] man with multiple medical problems: ESRD on HD for 8-9 years, DM, Hep c leading to cirrhosis, HTN, CAD, CVA, admitted to the hospital with fever, and sepsis. Pt's CBG in ED was less than 30. He was placed on pressors and given D50. Pt's dialysis catheter removed thinking this maybe the source of infection as well as he had a UTI. Blood cx came back positive for ESBL e.coli. He also has gangrenous left 5th toe and h/o osteomyelitis of left foot. Pt while hospitalized began to c/o epigastric pain and his Hgb was dropping. He underwent EGD and was found to have ulcerative esophagitis as well as bleeding gastric ulcer and duodenal ulcer. Placed on Protonix drip and epigastric pain improved. He also developed new onset of a-fib.  Met with son Burtis Imhoff, his uncle and his wife this morning for goals of care discussion. Family acknowledges how sick he is but remains hopefull despite his mounting medical problems and worsening clinical status, for pt to go to [place] and get a kidney transplant. Per pt's brother he took him to [place] about 6 months ago to be evaluated and was told " he needs to get his heart stronger" Pt's brother has a kidney transplant and did very well which I think is some of the driving force behind aggressive GOC. SIL, Judeen Hammans stated that she feels that the family wants him to have transplant and continue HD more than pt. Just this week in the hospital he was telling family he was tired and to let him go. Pt has been refusing transplant for years. Family feels he is afraid but does  want the procedure. Pt is chronically non-compliant with dialysis, missing days and asking to cut short his treatment. He also has not walked in 2 years. I discussed frankly that a transplant at this point even if he was a candidate would be unlikely to improve his function. Family states they understand this but feel that longevity is just as important. I did try and broach topic of when they might accept that he in fact is tired and would not want to pursue more aggressive treatment but we did not reach a conclusion. At this point, hoping to establish and maintain a relationship with the [facility] family and convey medical information as requested was outcome of this initial consult. Family is hopeful for DC home today or tomorrow with home health  Contacts/Participants in Discussion: Primary Decision Maker: Sons Pt is married. He and his wife speak very limited English   HCPOA: no  Legally his wife is but she cannot speak [place] and defers to her sons. They make decisions as a family  Code Status/Advance Care Planning:  Full. As pt's family was speaking in terms of transplant this topic in terms of DNR was not addressed.  Family wants all aggressive measures with ultimate goal to get stronger and go to [place] to have kidney transplant  Home with [facility]  Dialysis will be administering IV antibiotics every other day for approx 2 weeks  Pt need FU with cardiology as well as new GI appt  for bleeding ulcer managment  Symptom Management:   Pt does not speak [place]. Family states he denies pain, dyspnea  Palliative Prophylaxis: Needs bowel regimen . Chronic constipation secondary to poor mobility. Recommend adding senna 2-4 tabs daily. Enema today and may need disimpaction. Last BM per family 7 days ago  Additional Recommendations (Limitations, Scope, Preferences):  Needs on-going [place] discussion by community physcians Psycho-social/Spiritual:   Support System: yes  Desire for  further Chaplaincy support:no  Prognosis: Unable to determine. Would not be surprised however with his multiple medical problems, new infections, ESRD on HD if his prognosis despite all aggressive measures is 6 months or less. I did share this with family.   Discharge Planning:  Home with Home Health       Chief Complaint/History of Present Illness: Pt is a 69 yo Greg [name] man adm with fever chills, n/v. He acutely decompensated in ED requiring pressors as well as D50 for hypoglycemia, CBG less than 30  Primary Diagnoses  Present on Admission:  . Sepsis . Hypoglycemia . Gangrene of toe . Diabetic foot infection . Coronary atherosclerosis . Diabetes mellitus . Essential hypertension . Cirrhosis . Anemia . Thrombocytopenia . Nausea and vomiting . Osteomyelitis . Shock . Protein-calorie malnutrition, severe  Palliative Review of Systems: Pt does not speak English I have reviewed the medical record, interviewed the patient and family, and examined the patient. The following aspects are pertinent.  Past Medical History  Diagnosis Date  . ESRD on hemodialysis     Started HD around 2006.   Marland Kitchen Cirrhosis   . Alcohol abuse   . Stroke     left sided deficits  . Hypertension   . Diabetes mellitus     type 2   . Hepatitis     hep c  . Constipation   . Coronary artery disease   . Hypercholesterolemia    Social History   Social History  . Marital Status: Married    Spouse Name: N/A  . Number of Children: N/A  . Years of Education: N/A   Social History Main Topics  . Smoking status: Former Smoker    Quit date: 01/10/2006  . Smokeless tobacco: Never Used  . Alcohol Use: No     Comment: Former  . Drug Use: No  . Sexual Activity: Not Asked   Other Topics Concern  . None   Social History Narrative   Family History  Problem Relation Age of Onset  . Diabetes Mother   . Hypertension Mother   . Diabetes Father   . Hypertension Father    Scheduled Meds: . sodium  chloride   Intravenous Once  . amiodarone  200 mg Oral Daily  . atorvastatin  40 mg Oral QHS  . darbepoetin (ARANESP) injection - DIALYSIS  200 mcg Intravenous Q Sat-HD  . fluconazole (DIFLUCAN) IV  200 mg Intravenous Q24H  . insulin aspart  0-9 Units Subcutaneous 6 times per day  . lanthanum  1,000 mg Oral TID WC  . meropenem (MERREM) IV  500 mg Intravenous Q24H  . multivitamin  1 tablet Oral QHS  . [START ON 09/01/2014] pantoprazole (PROTONIX) IV  40 mg Intravenous Q12H  . polyethylene glycol  17 g Oral BID  . sodium chloride  10-40 mL Intracatheter Q12H   Continuous Infusions: . pantoprozole (PROTONIX) infusion 8 mg/hr (08/30/14 1030)   PRN Meds:.sodium chloride, sodium chloride, sodium chloride, acetaminophen **OR** acetaminophen, alteplase, feeding supplement (NEPRO CARB STEADY), haloperidol lactate, heparin, heparin, lidocaine (PF), lidocaine-prilocaine, ondansetron **  OR** ondansetron (ZOFRAN) IV, oxyCODONE, pentafluoroprop-tetrafluoroeth, RESOURCE THICKENUP CLEAR, sodium chloride Medications Prior to Admission:  Prior to Admission medications   Medication Sig Start Date End Date Taking? Authorizing Provider  aspirin 325 MG EC tablet Take 325 mg by mouth daily.   Yes Historical Provider, MD  atorvastatin (LIPITOR) 40 MG tablet Take 40 mg by mouth at bedtime.   Yes Historical Provider, MD  calcium acetate (PHOSLO) 667 MG capsule Take 1,334 mg by mouth 2 (two) times daily with a meal.  03/26/14  Yes Historical Provider, MD  carvedilol (COREG CR) 10 MG 24 hr capsule Take 10 mg by mouth as needed (for high HR). Takes on days he does not have dialysis TUES, THURS AND SAT   Yes Historical Provider, MD  insulin NPH-insulin regular (HUMULIN 70/30) (70-30) 100 UNIT/ML injection Inject 10 Units into the skin daily.    Yes Historical Provider, MD  naproxen sodium (ANAPROX) 220 MG tablet Take 220 mg by mouth 2 (two) times daily as needed (for pain).   Yes Historical Provider, MD  sorbitol 70 %  solution Take 30 mLs by mouth daily as needed (for laxative).  07/17/14  Yes Historical Provider, MD  amiodarone (PACERONE) 200 MG tablet Take 1 tablet (200 mg total) by mouth daily. 08/30/14   Charlynne Cousins, MD  clindamycin (CLEOCIN) 150 MG capsule Take 3 capsules (450 mg total) by mouth 3 (three) times daily. 04/24/14   Dalia Heading, PA-C  clopidogrel (PLAVIX) 75 MG tablet Take 1 tablet (75 mg total) by mouth every other day. 09/08/14   Charlynne Cousins, MD  docusate sodium (COLACE) 100 MG capsule Take 1 capsule (100 mg total) by mouth 2 (two) times daily. 07/10/13   Harden Mo, MD  fluconazole (DIFLUCAN) 100 MG tablet Take 1 tablet (100 mg total) by mouth daily. 08/30/14   Charlynne Cousins, MD  lanthanum (FOSRENOL) 1000 MG chewable tablet Chew 1 tablet (1,000 mg total) by mouth 3 (three) times daily with meals. 08/30/14   Charlynne Cousins, MD  meropenem 500 mg in sodium chloride 0.9 % 50 mL Inject 500 mg into the vein daily. 08/30/14   Charlynne Cousins, MD  Nutritional Supplements (FEEDING SUPPLEMENT, NEPRO CARB STEADY,) LIQD Take 237 mLs by mouth as needed (missed meal during dialysis.). 08/30/14   Charlynne Cousins, MD  oxyCODONE (OXY IR/ROXICODONE) 5 MG immediate release tablet Take 1 tablet (5 mg total) by mouth every 4 (four) hours as needed for moderate pain. 08/30/14   Charlynne Cousins, MD  pantoprazole (PROTONIX) 40 MG tablet Take 1 tablet (40 mg total) by mouth 2 (two) times daily. 08/30/14   Charlynne Cousins, MD  polyethylene glycol [facility] / Floria Raveling) packet Take 17 g by mouth daily as needed. 08/30/14   Charlynne Cousins, MD   No Known Allergies CBC:    Component Value Date/Time   WBC 6.1 08/30/2014 0932   HGB 8.8* 08/30/2014 0932   HCT 27.3* 08/30/2014 0932   PLT 71* 08/30/2014 0932   MCV 84.8 08/30/2014 0932   NEUTROABS 6.3 08/26/2014 0517   LYMPHSABS 0.8 08/26/2014 0517   MONOABS 0.8 08/26/2014 0517   EOSABS 0.0 08/26/2014 0517   BASOSABS 0.0  08/26/2014 0517   Comprehensive Metabolic Panel:    Component Value Date/Time   NA 137 08/30/2014 0932   K 3.2* 08/30/2014 0932   CL 100* 08/30/2014 0932   CO2 28 08/30/2014 0932   BUN 16 08/30/2014 0932   CREATININE 4.07*  08/30/2014 0932   GLUCOSE 86 08/30/2014 0932   CALCIUM 8.7* 08/30/2014 0932   AST 21 08/28/2014 1200   ALT 9* 08/28/2014 1200   ALKPHOS 121 08/28/2014 1200   BILITOT 0.9 08/28/2014 1200   PROT 6.2* 08/28/2014 1200   ALBUMIN 1.8* 08/30/2014 0932    Physical Exam: Vital Signs: BP 143/48 mmHg  Pulse 68  Temp(Src) 98.1 F (36.7 C) (Oral)  Resp 18  Ht _0  (1.727 m)  Wt 62.8 kg (138 lb 7.2 oz)  BMI 21.06 kg/m2  SpO2 100% SpO2: SpO2: 100 % O2 Device: O2 Device: Not Delivered O2 Flow Rate: O2 Flow Rate (L/min): 2 L/min Intake/output summary:  Intake/Output Summary (Last 24 hours) at 08/30/14 1450 Last data filed at 08/30/14 1312  Gross per 24 hour  Intake    291 ml  Output   3374 ml  Net  -3083 ml   LBM: Last BM Date:  (Unknown) Baseline Weight: Weight: 67.1 kg (147 lb 14.9 oz) Most recent weight: Weight: 62.8 kg (138 lb 7.2 oz)  Exam Findings:  General: Frail older man. Somnolent, speech soft Resp: No work of breathing Musculoskeletal: LE atrophy, knees beginning to draw upwards         Palliative Performance Scale: 30%              Additional Data Reviewed: Recent Labs     08/28/14  1837   08/29/14  1857  08/30/14  0932  WBC   --    < >  5.9  6.1  HGB   --    < >  8.8*  8.8*  PLT   --    < >  55*  71*  NA  136   --    --   137  BUN  34*   --    --   16  CREATININE  5.21*   --    --   4.07*   < > = values in this interval not displayed.     Time In: 1100 Time Out: 1230 Time Total: 90 min Greater than 50%  of this time was spent counseling and coordinating care related to the above assessment and plan. Discussed with Dr. Aileen Fass  Signed by: Dory Horn, NP  Dory Horn, NP  08/30/2014, 2:50 PM  Please  contact Palliative Medicine Team phone at [phone] for questions and concerns.

## 2014-08-30 NOTE — Discharge Summary (Signed)
Physician Discharge Summary  Greg [name] VHQ:[id number] DOB: [birth year] DOA: 08/23/2014  PCP: Rinaldo Cloud, MD  Admit date: 08/23/2014 Discharge date: 08/30/2014  Time spent: 35 minutes  Recommendations for Outpatient Follow-up:  1. Follow up with GI Dr. Evette Cristal in 2 weeks.  Discharge Diagnoses:  Principal Problem:   Sepsis Active Problems:   Essential hypertension   Coronary atherosclerosis   Gangrene of toe   Diabetic foot infection   Osteomyelitis   Hypoglycemia   Diabetes mellitus   ESRD on hemodialysis   Cirrhosis   Anemia   Thrombocytopenia   Nausea and vomiting   Shock   Hyperlipidemia   Coffee ground emesis   ESRD (end stage renal disease)   Protein-calorie malnutrition, severe   Abdominal pain, generalized   Duodenal ulcer   Hemorrhagic gastritis   Gastric ulcer   Palliative care encounter   Discharge Condition: stable  Diet recommendation: regular  Filed Weights   08/29/14 0712 08/29/14 0813 08/30/14 0859  Weight: 65.3 kg (143 lb 15.4 oz) 64.864 kg (143 lb) 64.8 kg (142 lb 13.7 oz)    History of present illness:  69 y.o. male with a history of ESRD on Dialysis (T, TH, Sat), DM2, HTN, CAD S/P CAth Cirrhosis, and Osteomyelitis of the Left foot who began to have fevers and chills after dialysis today along with Nausea and Vomiting. His temperature was rep[rted as 102, and he was taken to an area [facility] and from there he was sent to the ED. A Sepsis workup was initiated, and he was placed on IV Vancomycin and Ceftazidime and referred for admission. His family is at the bedside and give the history due to language barrier.  Hospital Course:  Sepsis due to ESBL Escherichia coli associated with dialysis catheter and arterial hypotension: - Started on admission empirically on Vanco and Fortaz, his dialysis catheter was removed. His blood culture came back with ESBL Escherichia coli, so he was changed to imipenem and Vancomycin wasstopped., Cultures from the  dietetic tip have remained negative. - Now on meropenem after HD, will need to be treated for 2 weeks, starting on 08/26/2014 - Repeat blood cultures on 08/26/2014 have remained negative.  Elevated troponin in the setting of new onset atrial fibrillation: - Cardiology recommended medical management. Due to his infectious etiology, discuss with GI will resume Plavix in 2 weeks.  Abdominal pain due to erosive gastritis hemorrhagic gastritis and gastric ulcer along with duodenal ulcer: - He was started empirically on IV Protonix GI was consulted and recommended an endoscopy that show severe erosive gastritis with bleeding stigma, possible hemorrhagic gastritis, with gastric ulcer with bleeding stigma and duodenal ulcer. GI also recommended IV Diflucan. - Once to tolerate orals he was changed to Protonix orally twice a day and Diflucan orally for 7 days.  End-stage renal disease with dialysis Tuesday Thursday and Saturdays: - Nephrology was consulted, dialysis catheter was removed on 08/25/2014. - HD catheter replacement on 08/27/2014. - AP fistula is not mature.  Anemia/thrombocytopenia: - Start on aspirin and Plavix due to elevation in troponins, but due to his abdominal pain is aspirin and Plavix was held he was done with results as above, he was transfused 1 unit of packed red blood cells and his hemoglobin remained stable.  Controlled diabetes mellitus type 2: - Continue long-acting insulin plus sliding scale.  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  Severe deconditioning: - Physical therapy evaluation is pending.  Cirrhosis with a history of hepatitis C: - Will need to follow-up as an outpatient with infectious disease.  Toxic metabolic encephalopathy: - Likely due to sepsis, resolved   Procedures:  Endoscopy that show multiple sources of bleeding site.  Dialysis catheter replacement on  08/27/2014  Ejection fraction on echo that showed an EF of 45%.  Consultations:  Renal  Cardiology  Gastroenterology  Nephrology  Discharge Exam: Filed Vitals:   08/30/14 1122  BP: 134/70  Pulse: 69  Temp:   Resp:     General: A&O x3 Cardiovascular: RRR Respiratory: good air movement CTA B/L  Discharge Instructions   Discharge Instructions    Diet - low sodium heart healthy    Complete by:  As directed      Increase activity slowly    Complete by:  As directed           Current Discharge Medication List    START taking these medications   Details  amiodarone (PACERONE) 200 MG tablet Take 1 tablet (200 mg total) by mouth daily. Qty: 30 tablet, Refills: 0    fluconazole (DIFLUCAN) 100 MG tablet Take 1 tablet (100 mg total) by mouth daily. Qty: 7 tablet, Refills: 0    lanthanum (FOSRENOL) 1000 MG chewable tablet Chew 1 tablet (1,000 mg total) by mouth 3 (three) times daily with meals. Qty: 90 tablet, Refills: 0    meropenem 500 mg in sodium chloride 0.9 % 50 mL Inject 500 mg into the vein daily. Qty: 10 Bottle, Refills: 0    Nutritional Supplements (FEEDING SUPPLEMENT, NEPRO CARB STEADY,) LIQD Take 237 mLs by mouth as needed (missed meal during dialysis.). Refills: 0    oxyCODONE (OXY IR/ROXICODONE) 5 MG immediate release tablet Take 1 tablet (5 mg total) by mouth every 4 (four) hours as needed for moderate pain. Qty: 30 tablet, Refills: 0    pantoprazole (PROTONIX) 40 MG tablet Take 1 tablet (40 mg total) by mouth 2 (two) times daily. Qty: 60 tablet, Refills: 0    polyethylene glycol (MIRALAX / GLYCOLAX) packet Take 17 g by mouth daily as needed. Qty: 14 each, Refills: 0      CONTINUE these medications which have CHANGED   Details  clopidogrel (PLAVIX) 75 MG tablet Take 1 tablet (75 mg total) by mouth every other day. Qty: 30 tablet, Refills: 0      CONTINUE these medications which have NOT CHANGED   Details  atorvastatin (LIPITOR) 40 MG tablet  Take 40 mg by mouth at bedtime.    calcium acetate (PHOSLO) 667 MG capsule Take 1,334 mg by mouth 2 (two) times daily with a meal.  Refills: 0    insulin NPH-insulin regular (HUMULIN 70/30) (70-30) 100 UNIT/ML injection Inject 10 Units into the skin daily.     docusate sodium (COLACE) 100 MG capsule Take 1 capsule (100 mg total) by mouth 2 (two) times daily. Qty: 10 capsule, Refills: 0      STOP taking these medications     aspirin 325 MG EC tablet      carvedilol (COREG CR) 10 MG 24 hr capsule      naproxen sodium (ANAPROX) 220 MG tablet      sorbitol 70 % solution      clindamycin (CLEOCIN) 150 MG capsule        No Known Allergies Follow-up Information    Follow up with [facility].   Why:  hoyer Secondary school teacher information:   [employer]  [address] [place] [address] [phone]       Follow up with [facility].   Why:  Physical therapy   Contact information:   [address] [place] [address] [phone]        The results of significant diagnostics from this hospitalization (including imaging, microbiology, ancillary and laboratory) are listed below for reference.    Significant Diagnostic Studies: Dg Chest Port 1 View  08/27/2014   CLINICAL DATA:  Status post dialysis catheter insertion  EXAM: PORTABLE CHEST - 1 VIEW  COMPARISON:  08/23/2014  FINDINGS: Mild bilateral interstitial prominence. No focal consolidation, pleural effusion or pneumothorax. Stable cardiomegaly. Thoracic aortic atherosclerosis. Left jugular central venous catheter with the tip projecting over the SVC. Dual lumen right-sided dialysis catheter.  IMPRESSION: 1. Right-sided dual-lumen dialysis catheter in.  No pneumothorax. 2. Mild pulmonary vascular congestion.   Electronically Signed   By: Elige Ko   On: 08/27/2014 15:38   Dg Chest Portable 1 View  08/24/2014   CLINICAL DATA:  Initial valuation for central line placement.  EXAM:  PORTABLE CHEST - 1 VIEW  COMPARISON:  Prior radiograph from earlier the same day.  FINDINGS: There has been interval placement of a left IJ approach central venous catheter with tip overlying the cavoatrial junction. Right-sided dialysis catheter is unchanged. Cardiomegaly stable. Mediastinal silhouette within normal limits. Atheromatous plaque within the aortic arch.  Lungs are normally inflated. Diffuse pulmonary vascular congestion and indistinctness of the interstitial markings again seen, suggesting mild edema. More patchy and linear right basilar opacity may reflect atelectasis or superimposed infiltrate. No pneumothorax.  IMPRESSION: 1. Interval placement of left IJ approach central venous catheter with tip overlying the cavoatrial junction. 2. Stable cardiomegaly with diffuse pulmonary vascular congestion and indistinctness of the interstitial markings, suggesting mild edema. 3. Superimposed mild patchy and linear right basilar opacity, which may reflect atelectasis or possible infiltrate.   Electronically Signed   By: Rise Mu M.D.   On: 08/24/2014 00:26   Dg Chest Portable 1 View  08/23/2014   CLINICAL DATA:  Code sepsis.  No chest complaints.  EXAM: PORTABLE CHEST - 1 VIEW  COMPARISON:  One-view chest x-ray 08/04/2014.  FINDINGS: The heart is enlarged. A right IJ dialysis catheter is in place. Atherosclerotic calcifications are present at the aortic arch. Slight increased interstitial markings suggests mild edema. No focal airspace consolidation is evident.  IMPRESSION: 1. Cardiomegaly and slight increased interstitial pattern compatible with edema. 2. No focal airspace consolidation to suggest infection. 3. Right IJ Port-A-Cath is stable in position. 4. Atherosclerosis of the thoracic aorta.   Electronically Signed   By: Marin Roberts M.D.   On: 08/23/2014 19:35   Dg Chest Port 1 View  08/04/2014   CLINICAL DATA:  Dialysis catheter insertion  EXAM: PORTABLE CHEST - 1 VIEW   COMPARISON:  07/19/2014  FINDINGS: Right jugular dual-lumen catheter is been placed with tips in the SVC and right atrium. No pneumothorax  Cardiac enlargement without heart failure or edema. Mild bibasilar atelectasis. Negative for pneumonia.  IMPRESSION: Satisfactory dual-lumen catheter placement in the right atrium. No pneumothorax  Mild atelectasis in the lung bases.   Electronically Signed   By: Marlan Palau M.D.   On: 08/04/2014 10:51   Dg Abd Portable 1v  08/25/2014   CLINICAL DATA:  Coffee-ground emesis.  Vomiting yesterday and today.  EXAM: PORTABLE ABDOMEN - 1 VIEW  COMPARISON:  Chest radiograph 08/23/2014  FINDINGS: Moderate distension of the stomach  with gas. There is a nonobstructive bowel gas pattern. Moderate amount of stool in the right abdomen. Gas and stool in the rectal region. Vascular calcifications in the aorta and iliac arteries. Limited evaluation for free air on these portable views.  IMPRESSION: Nonobstructive bowel gas pattern.  Moderate distention of the stomach.   Electronically Signed   By: Richarda Overlie M.D.   On: 08/25/2014 12:57   Dg Fluoro Guide Cv Line-no Report  08/27/2014   CLINICAL DATA:    FLOURO GUIDE CV LINE  Fluoroscopy was utilized by the requesting physician.  No radiographic  interpretation.    Dg Fluoro Guide Cv Line-no Report  08/04/2014   CLINICAL DATA:    FLOURO GUIDE CV LINE  Fluoroscopy was utilized by the requesting physician.  No radiographic  interpretation.     Microbiology: Recent Results (from the past 240 hour(s))  Culture, blood (routine x 2)     Status: None   Collection Time: 08/23/14  7:36 PM  Result Value Ref Range Status   Specimen Description BLOOD RIGHT ANTECUBITAL  Final   Special Requests BOTTLES DRAWN AEROBIC AND ANAEROBIC 5CC   Final   Culture  Setup Time   Final    GRAM NEGATIVE RODS IN BOTH AEROBIC AND ANAEROBIC BOTTLES CRITICAL RESULT CALLED TO, READ BACK BY AND VERIFIED WITH: P CLAUDIO 08/24/14 @1035  M VESTAL    Culture    Final    ESCHERICHIA COLI Confirmed Extended Spectrum Beta-Lactamase Producer (ESBL)    Report Status 08/26/2014 FINAL  Final   Organism ID, Bacteria ESCHERICHIA COLI  Final      Susceptibility   Escherichia coli - MIC*    AMPICILLIN >=32 RESISTANT Resistant     CEFAZOLIN >=64 RESISTANT Resistant     CEFEPIME 8 RESISTANT Resistant     CEFTAZIDIME 16 RESISTANT Resistant     CEFTRIAXONE >=64 RESISTANT Resistant     CIPROFLOXACIN >=4 RESISTANT Resistant     GENTAMICIN >=16 RESISTANT Resistant     IMIPENEM <=0.25 SENSITIVE Sensitive     TRIMETH/SULFA >=320 RESISTANT Resistant     AMPICILLIN/SULBACTAM >=32 RESISTANT Resistant     PIP/TAZO 8 SENSITIVE Sensitive     * ESCHERICHIA COLI  Culture, blood (routine x 2)     Status: None   Collection Time: 08/23/14  8:04 PM  Result Value Ref Range Status   Specimen Description BLOOD RIGHT HAND  Final   Special Requests BOTTLES DRAWN AEROBIC AND ANAEROBIC 4CC   Final   Culture  Setup Time   Final    GRAM NEGATIVE RODS CRITICAL RESULT CALLED TO, READ BACK BY AND VERIFIED WITH: T PHILLIPS 08/24/14 @ 1208 M VESTAL IN BOTH AEROBIC AND ANAEROBIC BOTTLES CONFIRMED BY K WOOTEN    Culture   Final    ESCHERICHIA COLI SUSCEPTIBILITIES PERFORMED ON PREVIOUS CULTURE WITHIN THE LAST 5 DAYS.    Report Status 08/26/2014 FINAL  Final  Urine culture     Status: None   Collection Time: 08/23/14  9:13 PM  Result Value Ref Range Status   Specimen Description URINE, CATHETERIZED  Final   Special Requests NONE  Final   Culture   Final    >=100,000 COLONIES/mL ESCHERICHIA COLI Confirmed Extended Spectrum Beta-Lactamase Producer (ESBL)    Report Status 08/26/2014 FINAL  Final   Organism ID, Bacteria ESCHERICHIA COLI  Final      Susceptibility   Escherichia coli - MIC*    AMPICILLIN >=32 RESISTANT Resistant     CEFAZOLIN >=64 RESISTANT  Resistant     CEFTRIAXONE >=64 RESISTANT Resistant     CIPROFLOXACIN >=4 RESISTANT Resistant     GENTAMICIN >=16  RESISTANT Resistant     IMIPENEM <=0.25 SENSITIVE Sensitive     NITROFURANTOIN <=16 SENSITIVE Sensitive     TRIMETH/SULFA >=320 RESISTANT Resistant     AMPICILLIN/SULBACTAM >=32 RESISTANT Resistant     PIP/TAZO 8 SENSITIVE Sensitive     * >=100,000 COLONIES/mL ESCHERICHIA COLI  MRSA PCR Screening     Status: None   Collection Time: 08/24/14  2:26 AM  Result Value Ref Range Status   MRSA by PCR NEGATIVE NEGATIVE Final    Comment:        The GeneXpert MRSA Assay (FDA approved for NASAL specimens only), is one component of a comprehensive MRSA colonization surveillance program. It is not intended to diagnose MRSA infection nor to guide or monitor treatment for MRSA infections.   Cath Tip Culture     Status: None (Preliminary result)   Collection Time: 08/25/14 12:12 PM  Result Value Ref Range Status   Specimen Description CATH TIP HEMODIALYSIS CATHETER  Final   Special Requests NONE  Final   Culture   Final    Culture reincubated for better growth Performed at [facility]    Report Status PENDING  Incomplete  Culture, blood (routine x 2)     Status: None (Preliminary result)   Collection Time: 08/26/14  2:45 PM  Result Value Ref Range Status   Specimen Description BLOOD RIGHT ANTECUBITAL  Final   Special Requests BOTTLES DRAWN AEROBIC AND ANAEROBIC 10CC  Final   Culture NO GROWTH 3 DAYS  Final   Report Status PENDING  Incomplete  Culture, blood (routine x 2)     Status: None (Preliminary result)   Collection Time: 08/26/14  3:00 PM  Result Value Ref Range Status   Specimen Description BLOOD RIGHT HAND  Final   Special Requests BOTTLES DRAWN AEROBIC AND ANAEROBIC 10CC  Final   Culture NO GROWTH 3 DAYS  Final   Report Status PENDING  Incomplete     Labs: Basic Metabolic Panel:  Recent Labs Lab 08/23/14 2227 08/24/14 0448 08/25/14 0230 08/26/14 0517 08/27/14 0442 08/28/14 1837 08/30/14 0932  NA  --  136 134* 137 134* 136 137  K  --  3.7 4.0 3.7 4.0 3.6  3.2*  CL  --  99* 95* 99* 99* 99* 100*  CO2  --  27 28 28 25 26 28   GLUCOSE  --  169* 143* 107* 103* 151* 86  BUN  --  14 22* 20 31* 34* 16  CREATININE  --  3.56* 4.22* 3.47* 4.59* 5.21* 4.07*  CALCIUM  --  8.3* 9.6 9.3 8.6* 8.5* 8.7*  MG 1.0* 2.1 2.6*  --   --   --   --   PHOS  --  3.2 3.6  --   --  2.5 3.2   Liver Function Tests:  Recent Labs Lab 08/23/14 1934 08/24/14 2330 08/28/14 1200 08/28/14 1837 08/30/14 0932  AST 42* 47* 21  --   --   ALT 10* 11* 9*  --   --   ALKPHOS 168* 125 121  --   --   BILITOT 0.8 1.0 0.9  --   --   PROT 6.2* 6.1* 6.2*  --   --   ALBUMIN 2.1* 1.8* 2.0* 1.9* 1.8*    Recent Labs Lab 08/24/14 2330 08/28/14 1200  LIPASE 14* 22   No  results for input(s): AMMONIA in the last 168 hours. CBC:  Recent Labs Lab 08/23/14 1934  08/26/14 0517 08/27/14 0442 08/29/14 0646 08/29/14 1857 08/30/14 0932  WBC 6.0  < > 8.0 6.3 6.4 5.9 6.1  NEUTROABS 5.8  --  6.3  --   --   --   --   HGB 8.9*  < > 8.3* 8.0* 7.7* 8.8* 8.8*  HCT 28.3*  < > 25.1* 25.2* 24.3* 26.9* 27.3*  MCV 86.0  < > 85.1 85.1 85.0 84.6 84.8  PLT 77*  < > 82* 60* 59* 55* 71*  < > = values in this interval not displayed. Cardiac Enzymes:  Recent Labs Lab 08/23/14 2227 08/24/14 0427 08/24/14 1050 08/25/14 0230 08/26/14 0517  TROPONINI 0.32* 1.13* 2.56* 3.21* 2.20*   BNP: BNP (last 3 results) No results for input(s): BNP in the last 8760 hours.  ProBNP (last 3 results) No results for input(s): PROBNP in the last 8760 hours.  CBG:  Recent Labs Lab 08/29/14 1657 08/29/14 2005 08/30/14 0008 08/30/14 0434 08/30/14 0741  GLUCAP 91 91 88 99 73     Signed:  FELIZ ORTIZ, ABRAHAM  [facility] 08/30/2014, 12:17 PM

## 2014-08-30 NOTE — Progress Notes (Signed)
[facility] Progress Note  Subjective: The patient has no complaints today. He denies abdominal pain today. EGD findings were reviewed. He had severe erosive esophagitis, a gastric ulcer, and a duodenal ulcer.  Objective: Vital signs in last 24 hours: Temp:  [96.9 F (36.1 C)-98.2 F (36.8 C)] 96.9 F (36.1 C) (08/20 0859) Pulse Rate:  [63-82] 82 (08/20 1018) Resp:  [16-20] 18 (08/20 0859) BP: (137-186)/(51-85) 137/85 mmHg (08/20 1018) SpO2:  [98 %-100 %] 99 % (08/20 0859) Weight:  [64.8 kg (142 lb 13.7 oz)] 64.8 kg (142 lb 13.7 oz) (08/20 0859) Weight change: -1.5 kg (-3 lb 4.9 oz)   PE:  Patient is in dialysis  No distress  Abdomen soft and nontender  Lab Results: Results for orders placed or performed during the hospital encounter of 08/23/14 (from the past 24 hour(s))  Prepare RBC     Status: None   Collection Time: 08/29/14 10:36 AM  Result Value Ref Range   Order Confirmation ORDER PROCESSED BY BLOOD BANK   Glucose, capillary     Status: Abnormal   Collection Time: 08/29/14 11:17 AM  Result Value Ref Range   Glucose-Capillary 105 (H) 65 - 99 mg/dL  Glucose, capillary     Status: None   Collection Time: 08/29/14  4:57 PM  Result Value Ref Range   Glucose-Capillary 91 65 - 99 mg/dL  CBC     Status: Abnormal   Collection Time: 08/29/14  6:57 PM  Result Value Ref Range   WBC 5.9 4.0 - 10.5 K/uL   RBC 3.18 (L) 4.22 - 5.81 MIL/uL   Hemoglobin 8.8 (L) 13.0 - 17.0 g/dL   HCT 23.5 (L) 36.1 - 44.3 %   MCV 84.6 78.0 - 100.0 fL   MCH 27.7 26.0 - 34.0 pg   MCHC 32.7 30.0 - 36.0 g/dL   RDW 15.4 (H) 00.8 - 67.6 %   Platelets 55 (L) 150 - 400 K/uL  Glucose, capillary     Status: None   Collection Time: 08/29/14  8:05 PM  Result Value Ref Range   Glucose-Capillary 91 65 - 99 mg/dL  Glucose, capillary     Status: None   Collection Time: 08/30/14 12:08 AM  Result Value Ref Range   Glucose-Capillary 88 65 - 99 mg/dL  Glucose, capillary     Status: None   Collection Time: 08/30/14  4:34 AM  Result Value Ref Range   Glucose-Capillary 99 65 - 99 mg/dL  Glucose, capillary     Status: None   Collection Time: 08/30/14  7:41 AM  Result Value Ref Range   Glucose-Capillary 73 65 - 99 mg/dL   Comment 1 Notify RN   CBC     Status: Abnormal   Collection Time: 08/30/14  9:32 AM  Result Value Ref Range   WBC 6.1 4.0 - 10.5 K/uL   RBC 3.22 (L) 4.22 - 5.81 MIL/uL   Hemoglobin 8.8 (L) 13.0 - 17.0 g/dL   HCT 19.5 (L) 09.3 - 26.7 %   MCV 84.8 78.0 - 100.0 fL   MCH 27.3 26.0 - 34.0 pg   MCHC 32.2 30.0 - 36.0 g/dL   RDW 12.4 (H) 58.0 - 99.8 %   Platelets 71 (L) 150 - 400 K/uL    Studies/Results: No results found.    Assessment: Erosive esophagitis, gastric ulcer, duodenal ulcer  Plan:   PPI therapy. We will sign off. Call us if needed.    Gwenevere Abbot 08/30/2014, 10:24 AM  Pager: [phone]  If no answer or after 5 PM call [phone]

## 2014-08-30 NOTE — Care Management Note (Signed)
Case Management Note  Patient Details  Name: Greg [name] MRN: [id number] Date of Birth: 04-30-1945  Subjective/Objective:                   ESRD Action/Plan: Discharge planning  Expected Discharge Date:  08/30/14               Expected Discharge Plan:  Home w Home Health Services  In-House Referral:     Discharge planning Services  CM Consult  Post Acute Care Choice:  Home Health Choice offered to:     DME Arranged:  Hospital bed, Wheelchair manual, Other see comment Secondary school teacher) DME Agency:  [facility]  HH Arranged:  PT HH Agency:  [facility]  Status of Service:  Completed, signed off  Medicare Important Message Given:  Yes-second notification given Date Medicare IM Given:    Medicare IM give by:    Date Additional Medicare IM Given:    Additional Medicare Important Message give by:     If discussed at Long Length of Stay Meetings, dates discussed:    Additional Comments: CM received call from NP, Eduard Roux, requesting I arrange home IV ABX.  CM notes pt is an active dialysis pt with no PICC or portacath which is required for home health agency to administer Home IV ABX.  CM explained dialysis usually get their IV ABX at dialysis and, no, HH agencies do not run peripheral IVs.  CM received a callback from NP stating pt would be getting ABX at dialysis.  CM notes previous began a [facility] service with [facility] and order was placed for hoyer lift, wheelchair with cushion, and hospital bed.  CM called [facility] rep, Tiffany to arrange delivery of equipment to the home so pt can be discharged.  CM went to room to touch base with pt and family but pt is off unit at this time.  [facility] rep, Elmarie Shiley has English-speaking son's contact number to arrange for delivery and notes ordered HHPT will be aware of need for interpreter services.  No other CM needs were communicated. Yves Dill, RN 08/30/2014, 1:11 PM

## 2014-08-31 LAB — CBC
HCT: 28.5 % — ABNORMAL LOW (ref 39.0–52.0)
HEMOGLOBIN: 9.3 g/dL — AB (ref 13.0–17.0)
MCH: 27.8 pg (ref 26.0–34.0)
MCHC: 32.6 g/dL (ref 30.0–36.0)
MCV: 85.1 fL (ref 78.0–100.0)
PLATELETS: 75 10*3/uL — AB (ref 150–400)
RBC: 3.35 MIL/uL — ABNORMAL LOW (ref 4.22–5.81)
RDW: 16.2 % — ABNORMAL HIGH (ref 11.5–15.5)
WBC: 5.9 10*3/uL (ref 4.0–10.5)

## 2014-08-31 LAB — CULTURE, BLOOD (ROUTINE X 2)
CULTURE: NO GROWTH
Culture: NO GROWTH

## 2014-08-31 LAB — GLUCOSE, CAPILLARY
GLUCOSE-CAPILLARY: 103 mg/dL — AB (ref 65–99)
Glucose-Capillary: 77 mg/dL (ref 65–99)
Glucose-Capillary: 106 mg/dL — ABNORMAL HIGH (ref 65–99)
Glucose-Capillary: 95 mg/dL (ref 65–99)

## 2014-08-31 MED ORDER — SODIUM CHLORIDE 0.9 % IV SOLN: 500.0000 mg | INTRAVENOUS | Status: DC

## 2014-08-31 NOTE — Progress Notes (Signed)
Subjective: Interval History: has complaints wants to get home.  Objective: Vital signs in last 24 hours: Temp:  [96.6 F (35.9 C)-99 F (37.2 C)] 97.5 F (36.4 C) (08/21 0400) Pulse Rate:  [63-82] 72 (08/21 0400) Resp:  [18] 18 (08/21 0400) BP: (73-186)/(48-85) 168/56 mmHg (08/21 0400) SpO2:  [98 %-100 %] 98 % (08/21 0400) Weight:  [62.8 kg (138 lb 7.2 oz)-64.8 kg (142 lb 13.7 oz)] 63 kg (138 lb 14.2 oz) (08/21 0424) Weight change: -0.5 kg (-1 lb 1.6 oz)  Intake/Output from previous day: 08/20 0701 - 08/21 0700 In: 120 [P.O.:120] Out: 3374  Intake/Output this shift:    General appearance: alert, cooperative and cachectic Resp: diminished breath sounds bilaterally Chest wall: RIJ cath Cardio: S1, S2 normal and systolic murmur: holosystolic 2/6, blowing at apex GI: soft, pos bs,liver down 5 cm Extremities: AVF LUA B&T, gangrenous L 5th toe, ischemic L foot, sores on legs to knees  Lab Results:  Recent Labs  08/30/14 0932 08/31/14 0510  WBC 6.1 5.9  HGB 8.8* 9.3*  HCT 27.3* 28.5*  PLT 71* 75*   BMET:  Recent Labs  08/28/14 1837 08/30/14 0932  NA 136 137  K 3.6 3.2*  CL 99* 100*  CO2 26 28  GLUCOSE 151* 86  BUN 34* 16  CREATININE 5.21* 4.07*  CALCIUM 8.5* 8.7*   No results for input(s): PTH in the last 72 hours. Iron Studies: No results for input(s): IRON, TIBC, TRANSFERRIN, FERRITIN in the last 72 hours.  Studies/Results: No results found.  I have reviewed the patient's current medications.  Assessment/Plan: 1 ESRD HD TTS stable .  2 E coli sepsis awaiting confirmation from [facility] that [facility] avail 3 DM controlled 4 Bp low at HD acceptable now 5 PVD severe 6 HPTH meds 7 Anemia stable to improving 8 Malnutrit on suppl P HD TTS, arrange Merum, control DM    LOS: 8 days   Greg [name] L 08/31/2014,7:43 AM

## 2014-08-31 NOTE — Progress Notes (Addendum)
[facility] PROGRESS NOTE Interim History: 69 year old male with past medical history of end-stage renal disease with a dietetic cath they ESBL Escherichia coli bacteremia status post removal of his dialysis catheter. Start her empirically on IV antibiotics bank's and Elita Quick now D escalated to imipenem. With elevated troponins in the setting of new onset of atrial fibrillation, which cardiology was consulted and recommended medical management currently on Plavix and amiodarone. With diabetic foot ulcer with dry gangrene. Started complaining of epigastric pain he was started on empiric Protonix and gastroenterology was consulted who recommended an EGD, EGD showed multiple sites of GI bleed Plavix was held, to be resumed a future date will follow-up with GI as an outpatient.   Assessment/Plan: Sepsis due to ESBL Escherichia coli associated with dialysis catheter and arterial hypotension: Now on meropenem after HD, will need to be treated for 2 weeks, starting on 08/26/2014, continue to get meropenem with HD, spoke with renal who relates he can get antibiotics every other day at HD.  Elevated troponin in the setting of new onset atrial fibrillation: Cardiology recommended medical management.  Abdominal pain due to erosive gastritis hemorrhagic gastritis and gastric ulcer along with duodenal ulcer: He was started empirically on IV Protonix GI was consulted and recommended an endoscopy that show severe erosive gastritis with bleeding stigma, possible hemorrhagic gastritis, with gastric ulcer with bleeding stigma and duodenal ulcer. GI also recommended  Diflucan continue for 5 days. Plavix was held, resuming 2 weeks.  End-stage renal disease with dialysis Tuesday Thursday and Saturdays: Nephrology was consulted, dialysis catheter was removed on 08/25/2014. HD catheter  replacement on 08/27/2014. AP fistula is not mature.  Anemia/thrombocytopenia: Hemoglobin remained stable.  Controlled  diabetes mellitus type 2: No changes made to his home regimen.  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  Severe deconditioning: Physical therapy evaluation is pending.  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  Toxic metabolic encephalopathy: Likely due to sepsis and now resolved.  Code Status: full Family Communication: son's  Disposition Plan: home in am   Consultants:  Vascular  Renal  Procedures:  Dialysis catheter replacement on 08/27/2014  2-D echo that showed an EF of 45%  Antibiotics:  Meropenem  HPI/Subjective: No complains.  Objective: Filed Vitals:   08/30/14 1351 08/30/14 2039 08/31/14 0400 08/31/14 0424  BP: 143/48 150/50 168/56   Pulse: 68 73 72   Temp: 98.1 F (36.7 C) 99 F (37.2 C) 97.5 F (36.4 C)   TempSrc: Oral Oral Oral   Resp:  18 18   Height:      Weight:    63 kg (138 lb 14.2 oz)  SpO2: 100% 100% 98%     Intake/Output Summary (Last 24 hours) at 08/31/14 0852 Last data filed at 08/30/14 1312  Gross per 24 hour  Intake    120 ml  Output   3374 ml  Net  -3254 ml   Filed Weights   08/30/14 0859 08/30/14 1312 08/31/14 0424  Weight: 64.8 kg (142 lb 13.7 oz) 62.8 kg (138 lb 7.2 oz) 63 kg (138 lb 14.2 oz)    Exam:  General: Alert, awake, oriented x2, in no acute distress.  HEENT: No bruits, no goiter.  Heart: Regular rate and rhythm. Lungs: Good air movement, clear Abdomen: Soft, epigastric tenderness nondistended, positive bowel sounds.  Neuro: Grossly intact, nonfocal.   Data Reviewed:  Basic Metabolic Panel:  Recent Labs Lab 08/25/14 0230 08/26/14 0517 08/27/14 0442 08/28/14 1837 08/30/14 0932  NA 134* 137 134* 136 137  K 4.0 3.7 4.0 3.6 3.2*  CL 95* 99* 99* 99* 100*  CO2 28 28 25 26 28   GLUCOSE 143* 107* 103* 151* 86  BUN 22* 20 31* 34* 16  CREATININE 4.22* 3.47*  4.59* 5.21* 4.07*  CALCIUM 9.6 9.3 8.6* 8.5* 8.7*  MG 2.6*  --   --   --   --   PHOS 3.6  --   --  2.5 3.2   Liver Function Tests:  Recent Labs Lab 08/24/14 2330 08/28/14 1200 08/28/14 1837 08/30/14 0932  AST 47* 21  --   --   ALT 11* 9*  --   --   ALKPHOS 125 121  --   --   BILITOT 1.0 0.9  --   --   PROT 6.1* 6.2*  --   --   ALBUMIN 1.8* 2.0* 1.9* 1.8*    Recent Labs Lab 08/24/14 2330 08/28/14 1200  LIPASE 14* 22   No results for input(s): AMMONIA in the last 168 hours. CBC:  Recent Labs Lab 08/26/14 0517 08/27/14 0442 08/29/14 0646 08/29/14 1857 08/30/14 0932 08/31/14 0510  WBC 8.0 6.3 6.4 5.9 6.1 5.9  NEUTROABS 6.3  --   --   --   --   --   HGB 8.3* 8.0* 7.7* 8.8* 8.8* 9.3*  HCT 25.1* 25.2* 24.3* 26.9* 27.3* 28.5*  MCV 85.1 85.1 85.0 84.6 84.8 85.1  PLT 82* 60* 59* 55* 71* 75*   Cardiac Enzymes:  Recent Labs Lab 08/24/14 1050 08/25/14 0230 08/26/14 0517  TROPONINI 2.56* 3.21* 2.20*   BNP (last 3 results) No results for input(s): BNP in the last 8760 hours.  ProBNP (last 3 results) No results for input(s): PROBNP in the last 8760 hours.  CBG:  Recent Labs Lab 08/30/14 1617 08/30/14 2035 08/31/14 0117 08/31/14 0410 08/31/14 0801  GLUCAP 119* 175* 77 106* 95    Recent Results (from the past 240 hour(s))  Culture, blood (routine x 2)     Status: None   Collection Time: 08/23/14  7:36 PM  Result Value Ref Range Status   Specimen Description BLOOD RIGHT ANTECUBITAL  Final   Special Requests BOTTLES DRAWN AEROBIC AND ANAEROBIC 5CC   Final   Culture  Setup Time   Final    GRAM NEGATIVE RODS IN BOTH AEROBIC AND ANAEROBIC BOTTLES CRITICAL RESULT CALLED TO, READ BACK BY AND VERIFIED WITH: P CLAUDIO 08/24/14 @1035  M VESTAL    Culture   Final    ESCHERICHIA COLI Confirmed Extended Spectrum Beta-Lactamase Producer (ESBL)    Report Status 08/26/2014 FINAL  Final   Organism ID, Bacteria ESCHERICHIA COLI  Final      Susceptibility    Escherichia coli - MIC*    AMPICILLIN >=32 RESISTANT Resistant     CEFAZOLIN >=64 RESISTANT Resistant     CEFEPIME 8 RESISTANT Resistant     CEFTAZIDIME 16 RESISTANT Resistant     CEFTRIAXONE >=64 RESISTANT Resistant     CIPROFLOXACIN >=4 RESISTANT Resistant     GENTAMICIN >=16 RESISTANT Resistant     IMIPENEM <=0.25 SENSITIVE Sensitive     TRIMETH/SULFA >=320 RESISTANT Resistant     AMPICILLIN/SULBACTAM >=32 RESISTANT Resistant     PIP/TAZO 8 SENSITIVE Sensitive     * ESCHERICHIA COLI  Culture, blood (routine x 2)     Status: None   Collection  Time: 08/23/14  8:04 PM  Result Value Ref Range Status   Specimen Description BLOOD RIGHT HAND  Final   Special Requests BOTTLES DRAWN AEROBIC AND ANAEROBIC 4CC   Final   Culture  Setup Time   Final    GRAM NEGATIVE RODS CRITICAL RESULT CALLED TO, READ BACK BY AND VERIFIED WITH: T PHILLIPS 08/24/14 @ 1208 M VESTAL IN BOTH AEROBIC AND ANAEROBIC BOTTLES CONFIRMED BY K WOOTEN    Culture   Final    ESCHERICHIA COLI SUSCEPTIBILITIES PERFORMED ON PREVIOUS CULTURE WITHIN THE LAST 5 DAYS.    Report Status 08/26/2014 FINAL  Final  Urine culture     Status: None   Collection Time: 08/23/14  9:13 PM  Result Value Ref Range Status   Specimen Description URINE, CATHETERIZED  Final   Special Requests NONE  Final   Culture   Final    >=100,000 COLONIES/mL ESCHERICHIA COLI Confirmed Extended Spectrum Beta-Lactamase Producer (ESBL)    Report Status 08/26/2014 FINAL  Final   Organism ID, Bacteria ESCHERICHIA COLI  Final      Susceptibility   Escherichia coli - MIC*    AMPICILLIN >=32 RESISTANT Resistant     CEFAZOLIN >=64 RESISTANT Resistant     CEFTRIAXONE >=64 RESISTANT Resistant     CIPROFLOXACIN >=4 RESISTANT Resistant     GENTAMICIN >=16 RESISTANT Resistant     IMIPENEM <=0.25 SENSITIVE Sensitive     NITROFURANTOIN <=16 SENSITIVE Sensitive     TRIMETH/SULFA >=320 RESISTANT Resistant     AMPICILLIN/SULBACTAM >=32 RESISTANT Resistant      PIP/TAZO 8 SENSITIVE Sensitive     * >=100,000 COLONIES/mL ESCHERICHIA COLI  MRSA PCR Screening     Status: None   Collection Time: 08/24/14  2:26 AM  Result Value Ref Range Status   MRSA by PCR NEGATIVE NEGATIVE Final    Comment:        The GeneXpert MRSA Assay (FDA approved for NASAL specimens only), is one component of a comprehensive MRSA colonization surveillance program. It is not intended to diagnose MRSA infection nor to guide or monitor treatment for MRSA infections.   Cath Tip Culture     Status: None (Preliminary result)   Collection Time: 08/25/14 12:12 PM  Result Value Ref Range Status   Specimen Description CATH TIP HEMODIALYSIS CATHETER  Final   Special Requests NONE  Final   Culture   Final    Culture reincubated for better growth Performed at [facility]    Report Status PENDING  Incomplete  Culture, blood (routine x 2)     Status: None (Preliminary result)   Collection Time: 08/26/14  2:45 PM  Result Value Ref Range Status   Specimen Description BLOOD RIGHT ANTECUBITAL  Final   Special Requests BOTTLES DRAWN AEROBIC AND ANAEROBIC 10CC  Final   Culture NO GROWTH 4 DAYS  Final   Report Status PENDING  Incomplete  Culture, blood (routine x 2)     Status: None (Preliminary result)   Collection Time: 08/26/14  3:00 PM  Result Value Ref Range Status   Specimen Description BLOOD RIGHT HAND  Final   Special Requests BOTTLES DRAWN AEROBIC AND ANAEROBIC 10CC  Final   Culture NO GROWTH 4 DAYS  Final   Report Status PENDING  Incomplete     Studies: No results found.  Scheduled Meds: . sodium chloride   Intravenous Once  . amiodarone  200 mg Oral Daily  . atorvastatin  40 mg Oral QHS  . darbepoetin (ARANESP)  injection - DIALYSIS  200 mcg Intravenous Q Sat-HD  . fluconazole (DIFLUCAN) IV  200 mg Intravenous Q24H  . insulin aspart  0-9 Units Subcutaneous 6 times per day  . lanthanum  1,000 mg Oral TID WC  . meropenem (MERREM) IV  500 mg Intravenous  Q24H  . multivitamin  1 tablet Oral QHS  . [START ON 09/01/2014] pantoprazole (PROTONIX) IV  40 mg Intravenous Q12H  . polyethylene glycol  17 g Oral BID  . sodium chloride  10-40 mL Intracatheter Q12H   Continuous Infusions: . pantoprozole (PROTONIX) infusion 8 mg/hr (08/30/14 1030)    Time Spent: 25 min   FELIZ Rosine Beat  [facility] Pager [phone]. If 7PM-7AM, please contact night-coverage at [email or web address], password [facility] 08/31/2014, 8:52 AM  LOS: 8 days

## 2014-08-31 NOTE — Progress Notes (Addendum)
ANTIBIOTIC CONSULT NOTE - FOLLOW UP  Pharmacy Consult for Meropenem Indication: ESBL E coli sepsis  No Known Allergies  Patient Measurements: Height:  (172.7 cm) Weight: 138 lb 14.2 oz (63 kg) IBW/kg (Calculated) : 68.4  Vital Signs Temp: 97.5 F (36.4 C) (08/21 0400) Temp Source: Oral (08/21 0400) BP: 168/56 mmHg (08/21 0400) Pulse Rate: 72 (08/21 0400)  Labs:  Recent Labs  08/28/14 1837  08/29/14 1857 08/30/14 0932 08/31/14 0510  WBC  --   < > 5.9 6.1 5.9  HGB  --   < > 8.8* 8.8* 9.3*  PLT  --   < > 55* 71* 75*  CREATININE 5.21*  --   --  4.07*  --   < > = values in this interval not displayed. Estimated Creatinine Clearance: 15.7 mL/min (by C-G formula based on Cr of 4.07). ESRD  Microbiology: Recent Results (from the past 720 hour(s))  Culture, blood (routine x 2)     Status: None   Collection Time: 08/23/14  7:36 PM  Result Value Ref Range Status   Specimen Description BLOOD RIGHT ANTECUBITAL  Final   Special Requests BOTTLES DRAWN AEROBIC AND ANAEROBIC 5CC   Final   Culture  Setup Time   Final    GRAM NEGATIVE RODS IN BOTH AEROBIC AND ANAEROBIC BOTTLES CRITICAL RESULT CALLED TO, READ BACK BY AND VERIFIED WITH: P CLAUDIO 08/24/14  M VESTAL    Culture   Final    ESCHERICHIA COLI Confirmed Extended Spectrum Beta-Lactamase Producer (ESBL)    Report Status 08/26/2014 FINAL  Final   Organism ID, Bacteria ESCHERICHIA COLI  Final      Susceptibility   Escherichia coli - MIC*    AMPICILLIN >=32 RESISTANT Resistant     CEFAZOLIN >=64 RESISTANT Resistant     CEFEPIME 8 RESISTANT Resistant     CEFTAZIDIME 16 RESISTANT Resistant     CEFTRIAXONE >=64 RESISTANT Resistant     CIPROFLOXACIN >=4 RESISTANT Resistant     GENTAMICIN >=16 RESISTANT Resistant     IMIPENEM <=0.25 SENSITIVE Sensitive     TRIMETH/SULFA >=320 RESISTANT Resistant     AMPICILLIN/SULBACTAM >=32 RESISTANT Resistant     PIP/TAZO 8 SENSITIVE Sensitive     * ESCHERICHIA COLI  Culture,  blood (routine x 2)     Status: None   Collection Time: 08/23/14  8:04 PM  Result Value Ref Range Status   Specimen Description BLOOD RIGHT HAND  Final   Special Requests BOTTLES DRAWN AEROBIC AND ANAEROBIC 4CC   Final   Culture  Setup Time   Final    GRAM NEGATIVE RODS CRITICAL RESULT CALLED TO, READ BACK BY AND VERIFIED WITH: T PHILLIPS 08/24/14 @ 1208 M VESTAL IN BOTH AEROBIC AND ANAEROBIC BOTTLES CONFIRMED BY K WOOTEN    Culture   Final    ESCHERICHIA COLI SUSCEPTIBILITIES PERFORMED ON PREVIOUS CULTURE WITHIN THE LAST 5 DAYS.    Report Status 08/26/2014 FINAL  Final  Urine culture     Status: None   Collection Time: 08/23/14  9:13 PM  Result Value Ref Range Status   Specimen Description URINE, CATHETERIZED  Final   Special Requests NONE  Final   Culture   Final    >=100,000 COLONIES/mL ESCHERICHIA COLI Confirmed Extended Spectrum Beta-Lactamase Producer (ESBL)    Report Status 08/26/2014 FINAL  Final   Organism ID, Bacteria ESCHERICHIA COLI  Final      Susceptibility   Escherichia coli - MIC*    AMPICILLIN >=32 RESISTANT  Resistant     CEFAZOLIN >=64 RESISTANT Resistant     CEFTRIAXONE >=64 RESISTANT Resistant     CIPROFLOXACIN >=4 RESISTANT Resistant     GENTAMICIN >=16 RESISTANT Resistant     IMIPENEM <=0.25 SENSITIVE Sensitive     NITROFURANTOIN <=16 SENSITIVE Sensitive     TRIMETH/SULFA >=320 RESISTANT Resistant     AMPICILLIN/SULBACTAM >=32 RESISTANT Resistant     PIP/TAZO 8 SENSITIVE Sensitive     * >=100,000 COLONIES/mL ESCHERICHIA COLI  MRSA PCR Screening     Status: None   Collection Time: 08/24/14  2:26 AM  Result Value Ref Range Status   MRSA by PCR NEGATIVE NEGATIVE Final    Comment:        The GeneXpert MRSA Assay (FDA approved for NASAL specimens only), is one component of a comprehensive MRSA colonization surveillance program. It is not intended to diagnose MRSA infection nor to guide or monitor treatment for MRSA infections.   Cath Tip Culture      Status: None (Preliminary result)   Collection Time: 08/25/14 12:12 PM  Result Value Ref Range Status   Specimen Description CATH TIP HEMODIALYSIS CATHETER  Final   Special Requests NONE  Final   Culture   Final    Culture reincubated for better growth Performed at [facility]    Report Status PENDING  Incomplete  Culture, blood (routine x 2)     Status: None (Preliminary result)   Collection Time: 08/26/14  2:45 PM  Result Value Ref Range Status   Specimen Description BLOOD RIGHT ANTECUBITAL  Final   Special Requests BOTTLES DRAWN AEROBIC AND ANAEROBIC 10CC  Final   Culture NO GROWTH 4 DAYS  Final   Report Status PENDING  Incomplete  Culture, blood (routine x 2)     Status: None (Preliminary result)   Collection Time: 08/26/14  3:00 PM  Result Value Ref Range Status   Specimen Description BLOOD RIGHT HAND  Final   Special Requests BOTTLES DRAWN AEROBIC AND ANAEROBIC 10CC  Final   Culture NO GROWTH 4 DAYS  Final   Report Status PENDING  Incomplete   Assessment:  day # 6 of 14 carbapenem for ESBL E coli sepsis.  Primaxin begun on 8/16 then changed to Meropenem on 8/18.  8/13 urine culture with ESBL E coli  8/13 blood cultures x 2 with ESBL E coli  8/16 blood cultures x 2 no growth x 4 days  8/15 catheter tip culture pending  Goal of Therapy:  appropriate Meropenem dose for renal function and infection  Plan:   Continue Meropenem 500 mg IV q24hrs.   Expected to stop after dose on 09/09/14.  Doses to be given after dialysis on dialysis days.  Noted working on availability of Meropenem at [facility].  Follow up final cultures.  Dennie Fetters, RPh Pager: [phone] 08/31/2014,3:31 PM

## 2014-08-31 NOTE — Clinical Social Work Note (Signed)
Per MD patient ready for DC home. RN and patient/family notified of DC home. Address confirmed by patient/family. DC packet on chart. Ambulance transport will be requested by RN once patient can DC (number on DC packet). CSW signing off at this time.    Roddie Mc MSW, [place], [place], [phone]

## 2014-08-31 NOTE — Progress Notes (Addendum)
Reviewed discharge with family. Ambulance has been called. Central line removed. Spoke with MD confirmed that dialysis center has the antibioitc he needs for his ecoli. Ok to discharge. Awaiting ambulance at this time.   Family canceled ambulance will take patient home in there car. MD notified.   Athol Bolds, Charlaine Dalton

## 2014-09-01 ENCOUNTER — Encounter (HOSPITAL_COMMUNITY): Payer: Self-pay | Admitting: Gastroenterology

## 2014-09-02 LAB — CATH TIP CULTURE: Culture: >100

## 2014-09-09 ENCOUNTER — Encounter: Payer: Self-pay | Admitting: Vascular Surgery

## 2014-09-10 ENCOUNTER — Ambulatory Visit (INDEPENDENT_AMBULATORY_CARE_PROVIDER_SITE_OTHER): Payer: Self-pay | Admitting: Vascular Surgery

## 2014-09-10 ENCOUNTER — Encounter: Payer: Self-pay | Admitting: Vascular Surgery

## 2014-09-10 VITALS — BP 160/80 | HR 70 | Temp 97.2°F | Resp 18 | Ht 69.0 in | Wt 150.0 lb

## 2014-09-10 DIAGNOSIS — Z992 Dependence on renal dialysis: Secondary | ICD-10-CM

## 2014-09-10 DIAGNOSIS — N186 End stage renal disease: Secondary | ICD-10-CM

## 2014-09-10 MED ORDER — OXYCODONE-ACETAMINOPHEN 5-325 MG PO TABS: 1.0000 | ORAL_TABLET | ORAL | Status: DC | PRN

## 2014-09-10 NOTE — Progress Notes (Signed)
    Postoperative Access Visit   History of Present Illness  Greg [name] is a 69 y.o. year old male who presents for postoperative follow-up for: RIJV TDC (Date: 08/27/14) and L BC AVF plication (08/04/14) by Dr. Darrick Penna.  The patient's wounds are nearly healed.  The patient notes no steal symptoms.  The patient is not able to complete their activities of daily living at baseline.  Pt is complaining of pain at [facility] exit site.  For VQI Use Only  PRE-ADM LIVING: Home  AMB STATUS: Ambulatory  Physical Examination Filed Vitals:   09/10/14 1523  BP: 160/80  Pulse: 70  Temp:   Resp:    R chest: bandaged, no bleeding, no obvious signs of cellulitis  LUE: Incision is mostly healed, small area of eschar in distal incision resistant to removal, skin feels warm, hand grip is 5/5, sensation in digits is intact, palpable thrill, bruit can be auscultated   Medical Decision Making  Greg [name] is a 69 y.o. year old male who presents s/p RIJV TDC, L BC AVF plication .  The patient's access is ready for use.  The patient's tunneled dialysis catheter can be removed after two successful cannulations and completed dialysis treatments.  Thank you for allowing [place] to participate in this patient's care.  Leonides Sake, MD [facility] [place] Office: [phone] Pager: [phone]  09/10/2014, 4:05 PM

## 2014-10-15 ENCOUNTER — Encounter (HOSPITAL_COMMUNITY): Payer: Self-pay | Admitting: Emergency Medicine

## 2014-10-15 ENCOUNTER — Encounter (HOSPITAL_BASED_OUTPATIENT_CLINIC_OR_DEPARTMENT_OTHER): Payer: Medicare Other | Attending: Surgery

## 2014-10-15 ENCOUNTER — Emergency Department (HOSPITAL_COMMUNITY): Payer: Medicare Other

## 2014-10-15 ENCOUNTER — Inpatient Hospital Stay (HOSPITAL_COMMUNITY)
Admission: EM | Admit: 2014-10-15 | Discharge: 2014-10-21 | DRG: 727 | Disposition: A | Discharge status: Home Health | Payer: Medicare Other | Attending: Internal Medicine | Admitting: Internal Medicine

## 2014-10-15 DIAGNOSIS — F0390 Unspecified dementia without behavioral disturbance: Secondary | ICD-10-CM | POA: Diagnosis present

## 2014-10-15 DIAGNOSIS — Z833 Family history of diabetes mellitus: Secondary | ICD-10-CM | POA: Diagnosis not present

## 2014-10-15 DIAGNOSIS — Z961 Presence of intraocular lens: Secondary | ICD-10-CM | POA: Diagnosis present

## 2014-10-15 DIAGNOSIS — R627 Adult failure to thrive: Secondary | ICD-10-CM | POA: Diagnosis present

## 2014-10-15 DIAGNOSIS — I120 Hypertensive chronic kidney disease with stage 5 chronic kidney disease or end stage renal disease: Secondary | ICD-10-CM | POA: Diagnosis present

## 2014-10-15 DIAGNOSIS — I69354 Hemiplegia and hemiparesis following cerebral infarction affecting left non-dominant side: Secondary | ICD-10-CM

## 2014-10-15 DIAGNOSIS — I480 Paroxysmal atrial fibrillation: Secondary | ICD-10-CM | POA: Diagnosis present

## 2014-10-15 DIAGNOSIS — E43 Unspecified severe protein-calorie malnutrition: Secondary | ICD-10-CM | POA: Diagnosis present

## 2014-10-15 DIAGNOSIS — I2510 Atherosclerotic heart disease of native coronary artery without angina pectoris: Secondary | ICD-10-CM | POA: Diagnosis present

## 2014-10-15 DIAGNOSIS — D638 Anemia in other chronic diseases classified elsewhere: Secondary | ICD-10-CM | POA: Diagnosis present

## 2014-10-15 DIAGNOSIS — N2581 Secondary hyperparathyroidism of renal origin: Secondary | ICD-10-CM | POA: Diagnosis present

## 2014-10-15 DIAGNOSIS — Z794 Long term (current) use of insulin: Secondary | ICD-10-CM | POA: Diagnosis not present

## 2014-10-15 DIAGNOSIS — N412 Abscess of prostate: Secondary | ICD-10-CM | POA: Diagnosis present

## 2014-10-15 DIAGNOSIS — E785 Hyperlipidemia, unspecified: Secondary | ICD-10-CM | POA: Diagnosis present

## 2014-10-15 DIAGNOSIS — Z992 Dependence on renal dialysis: Secondary | ICD-10-CM

## 2014-10-15 DIAGNOSIS — I10 Essential (primary) hypertension: Secondary | ICD-10-CM | POA: Diagnosis present

## 2014-10-15 DIAGNOSIS — Z7902 Long term (current) use of antithrombotics/antiplatelets: Secondary | ICD-10-CM

## 2014-10-15 DIAGNOSIS — K7460 Unspecified cirrhosis of liver: Secondary | ICD-10-CM | POA: Diagnosis present

## 2014-10-15 DIAGNOSIS — Z9849 Cataract extraction status, unspecified eye: Secondary | ICD-10-CM

## 2014-10-15 DIAGNOSIS — I252 Old myocardial infarction: Secondary | ICD-10-CM

## 2014-10-15 DIAGNOSIS — E1152 Type 2 diabetes mellitus with diabetic peripheral angiopathy with gangrene: Secondary | ICD-10-CM | POA: Diagnosis present

## 2014-10-15 DIAGNOSIS — I255 Ischemic cardiomyopathy: Secondary | ICD-10-CM | POA: Diagnosis present

## 2014-10-15 DIAGNOSIS — Z79899 Other long term (current) drug therapy: Secondary | ICD-10-CM

## 2014-10-15 DIAGNOSIS — Z515 Encounter for palliative care: Secondary | ICD-10-CM | POA: Diagnosis not present

## 2014-10-15 DIAGNOSIS — N419 Inflammatory disease of prostate, unspecified: Secondary | ICD-10-CM | POA: Insufficient documentation

## 2014-10-15 DIAGNOSIS — N186 End stage renal disease: Secondary | ICD-10-CM | POA: Diagnosis present

## 2014-10-15 DIAGNOSIS — Z8249 Family history of ischemic heart disease and other diseases of the circulatory system: Secondary | ICD-10-CM

## 2014-10-15 DIAGNOSIS — E118 Type 2 diabetes mellitus with unspecified complications: Secondary | ICD-10-CM | POA: Diagnosis present

## 2014-10-15 DIAGNOSIS — N410 Acute prostatitis: Principal | ICD-10-CM | POA: Diagnosis present

## 2014-10-15 DIAGNOSIS — R1030 Lower abdominal pain, unspecified: Secondary | ICD-10-CM | POA: Insufficient documentation

## 2014-10-15 DIAGNOSIS — R778 Other specified abnormalities of plasma proteins: Secondary | ICD-10-CM | POA: Diagnosis present

## 2014-10-15 DIAGNOSIS — Z87891 Personal history of nicotine dependence: Secondary | ICD-10-CM

## 2014-10-15 DIAGNOSIS — N400 Enlarged prostate without lower urinary tract symptoms: Secondary | ICD-10-CM | POA: Diagnosis present

## 2014-10-15 DIAGNOSIS — D696 Thrombocytopenia, unspecified: Secondary | ICD-10-CM | POA: Diagnosis present

## 2014-10-15 DIAGNOSIS — I214 Non-ST elevation (NSTEMI) myocardial infarction: Secondary | ICD-10-CM | POA: Diagnosis not present

## 2014-10-15 DIAGNOSIS — E1122 Type 2 diabetes mellitus with diabetic chronic kidney disease: Secondary | ICD-10-CM | POA: Diagnosis present

## 2014-10-15 DIAGNOSIS — R7989 Other specified abnormal findings of blood chemistry: Secondary | ICD-10-CM | POA: Diagnosis not present

## 2014-10-15 DIAGNOSIS — I639 Cerebral infarction, unspecified: Secondary | ICD-10-CM | POA: Diagnosis present

## 2014-10-15 DIAGNOSIS — B1920 Unspecified viral hepatitis C without hepatic coma: Secondary | ICD-10-CM | POA: Diagnosis present

## 2014-10-15 DIAGNOSIS — I219 Acute myocardial infarction, unspecified: Secondary | ICD-10-CM

## 2014-10-15 LAB — COMPREHENSIVE METABOLIC PANEL
ALT: 9 U/L — AB (ref 17–63)
AST: 27 U/L (ref 15–41)
Albumin: 1.9 g/dL — ABNORMAL LOW (ref 3.5–5.0)
Alkaline Phosphatase: 132 U/L — ABNORMAL HIGH (ref 38–126)
Anion gap: 12 (ref 5–15)
BUN: 24 mg/dL — AB (ref 6–20)
CHLORIDE: 91 mmol/L — AB (ref 101–111)
CO2: 29 mmol/L (ref 22–32)
CREATININE: 4.67 mg/dL — AB (ref 0.61–1.24)
Calcium: 8.3 mg/dL — ABNORMAL LOW (ref 8.9–10.3)
GFR calc Af Amer: 14 mL/min — ABNORMAL LOW (ref >60)
GFR calc non Af Amer: 12 mL/min — ABNORMAL LOW (ref >60)
GLUCOSE: 126 mg/dL — AB (ref 65–99)
Potassium: 4.4 mmol/L (ref 3.5–5.1)
SODIUM: 132 mmol/L — AB (ref 135–145)
Total Bilirubin: 0.8 mg/dL (ref 0.3–1.2)
Total Protein: 6.7 g/dL (ref 6.5–8.1)

## 2014-10-15 LAB — CBC WITH DIFFERENTIAL/PLATELET
Basophils Absolute: 0 10*3/uL (ref 0.0–0.1)
Basophils Relative: 0 %
EOS ABS: 0 10*3/uL (ref 0.0–0.7)
EOS PCT: 0 %
HCT: 30.8 % — ABNORMAL LOW (ref 39.0–52.0)
HEMOGLOBIN: 10 g/dL — AB (ref 13.0–17.0)
LYMPHS ABS: 1 10*3/uL (ref 0.7–4.0)
Lymphocytes Relative: 6 %
MCH: 28 pg (ref 26.0–34.0)
MCHC: 32.5 g/dL (ref 30.0–36.0)
MCV: 86.3 fL (ref 78.0–100.0)
MONOS PCT: 6 %
Monocytes Absolute: 1.1 10*3/uL — ABNORMAL HIGH (ref 0.1–1.0)
Neutro Abs: 16.2 10*3/uL — ABNORMAL HIGH (ref 1.7–7.7)
Neutrophils Relative %: 88 %
PLATELETS: 105 10*3/uL — AB (ref 150–400)
RBC: 3.57 MIL/uL — ABNORMAL LOW (ref 4.22–5.81)
RDW: 18.4 % — ABNORMAL HIGH (ref 11.5–15.5)
WBC: 18.4 10*3/uL — ABNORMAL HIGH (ref 4.0–10.5)

## 2014-10-15 LAB — TROPONIN I
TROPONIN I: 0.35 ng/mL — AB (ref <0.031)
Troponin I: 0.24 ng/mL — ABNORMAL HIGH (ref <0.031)

## 2014-10-15 LAB — MRSA PCR SCREENING: MRSA BY PCR: NEGATIVE

## 2014-10-15 LAB — GLUCOSE, CAPILLARY
GLUCOSE-CAPILLARY: 115 mg/dL — AB (ref 65–99)
GLUCOSE-CAPILLARY: 118 mg/dL — AB (ref 65–99)

## 2014-10-15 MED ORDER — INSULIN ASPART 100 UNIT/ML ~~LOC~~ SOLN: 0.0000 [IU] | SUBCUTANEOUS | Status: DC

## 2014-10-15 MED ORDER — CARVEDILOL PHOSPHATE ER 10 MG PO CP24
10.0000 mg | ORAL_CAPSULE | Freq: Every day | ORAL | Status: DC
  Administered 2014-10-17 – 2014-10-20 (×4): 10 mg via ORAL
  Filled 2014-10-15 (×6): qty 1

## 2014-10-15 MED ORDER — LISINOPRIL 5 MG PO TABS
5.0000 mg | ORAL_TABLET | Freq: Every day | ORAL | Status: DC
Start: 2014-10-16 — End: 2014-10-21
  Administered 2014-10-16 – 2014-10-20 (×5): 5 mg via ORAL
  Filled 2014-10-15 (×6): qty 1

## 2014-10-15 MED ORDER — ONDANSETRON HCL 4 MG PO TABS: 4.0000 mg | ORAL_TABLET | Freq: Four times a day (QID) | ORAL | Status: DC | PRN

## 2014-10-15 MED ORDER — INSULIN ASPART PROT & ASPART (70-30 MIX) 100 UNIT/ML ~~LOC~~ SUSP
5.0000 [IU] | Freq: Every day | SUBCUTANEOUS | Status: DC
  Filled 2014-10-15: qty 10

## 2014-10-15 MED ORDER — CALCIUM ACETATE (PHOS BINDER) 667 MG PO CAPS: 1334.0000 mg | ORAL_CAPSULE | Freq: Two times a day (BID) | ORAL | Status: DC | PRN

## 2014-10-15 MED ORDER — SODIUM CHLORIDE 0.9 % IJ SOLN: 3.0000 mL | INTRAMUSCULAR | Status: DC | PRN

## 2014-10-15 MED ORDER — GABAPENTIN 100 MG PO CAPS
100.0000 mg | ORAL_CAPSULE | Freq: Every day | ORAL | Status: DC
Start: 2014-10-16 — End: 2014-10-21
  Administered 2014-10-16 – 2014-10-20 (×5): 100 mg via ORAL
  Filled 2014-10-15 (×6): qty 1

## 2014-10-15 MED ORDER — ATORVASTATIN CALCIUM 40 MG PO TABS
40.0000 mg | ORAL_TABLET | Freq: Every day | ORAL | Status: DC
  Administered 2014-10-15 – 2014-10-19 (×4): 40 mg via ORAL
  Filled 2014-10-15 (×6): qty 1

## 2014-10-15 MED ORDER — LANTHANUM CARBONATE 500 MG PO CHEW
1000.0000 mg | CHEWABLE_TABLET | Freq: Three times a day (TID) | ORAL | Status: DC
  Administered 2014-10-18 – 2014-10-20 (×4): 1000 mg via ORAL
  Filled 2014-10-15 (×6): qty 2

## 2014-10-15 MED ORDER — ONDANSETRON HCL 4 MG/2ML IJ SOLN: 4.0000 mg | Freq: Three times a day (TID) | INTRAMUSCULAR | Status: AC | PRN

## 2014-10-15 MED ORDER — SODIUM CHLORIDE 0.9 % IV SOLN: 250.0000 mL | INTRAVENOUS | Status: DC | PRN

## 2014-10-15 MED ORDER — HYDROMORPHONE HCL 1 MG/ML IJ SOLN: 0.5000 mg | INTRAMUSCULAR | Status: DC | PRN

## 2014-10-15 MED ORDER — ONDANSETRON HCL 4 MG/2ML IJ SOLN: 4.0000 mg | Freq: Four times a day (QID) | INTRAMUSCULAR | Status: DC | PRN

## 2014-10-15 MED ORDER — SODIUM CHLORIDE 0.9 % IJ SOLN
3.0000 mL | Freq: Two times a day (BID) | INTRAMUSCULAR | Status: DC
  Administered 2014-10-15 – 2014-10-18 (×5): 3 mL via INTRAVENOUS
  Administered 2014-10-20: 03:00:00 via INTRAVENOUS

## 2014-10-15 MED ORDER — PANTOPRAZOLE SODIUM 40 MG PO TBEC
40.0000 mg | DELAYED_RELEASE_TABLET | Freq: Two times a day (BID) | ORAL | Status: DC
  Administered 2014-10-15 – 2014-10-20 (×8): 40 mg via ORAL
  Filled 2014-10-15 (×11): qty 1

## 2014-10-15 MED ORDER — DEXTROSE 5 % IV SOLN
1.0000 g | Freq: Once | INTRAVENOUS | Status: AC
  Administered 2014-10-15: 1 g via INTRAVENOUS
  Filled 2014-10-15: qty 10

## 2014-10-15 MED ORDER — VANCOMYCIN HCL 500 MG IV SOLR
500.0000 mg | INTRAVENOUS | Status: DC
  Administered 2014-10-16 – 2014-10-18 (×2): 500 mg via INTRAVENOUS
  Filled 2014-10-15 (×6): qty 500

## 2014-10-15 MED ORDER — DOCUSATE SODIUM 100 MG PO CAPS
100.0000 mg | ORAL_CAPSULE | Freq: Two times a day (BID) | ORAL | Status: DC
  Administered 2014-10-15 – 2014-10-20 (×8): 100 mg via ORAL
  Filled 2014-10-15 (×11): qty 1

## 2014-10-15 MED ORDER — ACETAMINOPHEN 325 MG PO TABS: 650.0000 mg | ORAL_TABLET | Freq: Four times a day (QID) | ORAL | Status: DC | PRN

## 2014-10-15 MED ORDER — VANCOMYCIN HCL 10 G IV SOLR
1250.0000 mg | Freq: Once | INTRAVENOUS | Status: AC
Start: 2014-10-15 — End: 2014-10-16
  Administered 2014-10-16: 1250 mg via INTRAVENOUS
  Filled 2014-10-15: qty 1250

## 2014-10-15 MED ORDER — POLYETHYLENE GLYCOL 3350 17 G PO PACK: 17.0000 g | PACK | Freq: Every day | ORAL | Status: DC | PRN

## 2014-10-15 MED ORDER — PIPERACILLIN-TAZOBACTAM IN DEX 2-0.25 GM/50ML IV SOLN
2.2500 g | Freq: Three times a day (TID) | INTRAVENOUS | Status: DC
  Administered 2014-10-16 (×2): 2.25 g via INTRAVENOUS
  Filled 2014-10-15 (×5): qty 50

## 2014-10-15 MED ORDER — SODIUM CHLORIDE 0.9 % IJ SOLN
3.0000 mL | Freq: Two times a day (BID) | INTRAMUSCULAR | Status: DC
  Administered 2014-10-15 – 2014-10-20 (×5): 3 mL via INTRAVENOUS

## 2014-10-15 MED ORDER — CALCIUM ACETATE (PHOS BINDER) 667 MG PO CAPS
2001.0000 mg | ORAL_CAPSULE | Freq: Three times a day (TID) | ORAL | Status: DC
  Administered 2014-10-16: 2001 mg via ORAL
  Filled 2014-10-15: qty 3

## 2014-10-15 MED ORDER — IOHEXOL 300 MG/ML  SOLN
25.0000 mL | Freq: Once | INTRAMUSCULAR | Status: AC | PRN
  Administered 2014-10-15: 25 mL via ORAL

## 2014-10-15 MED ORDER — ACETAMINOPHEN 650 MG RE SUPP: 650.0000 mg | Freq: Four times a day (QID) | RECTAL | Status: DC | PRN

## 2014-10-15 MED ORDER — OXYCODONE-ACETAMINOPHEN 5-325 MG PO TABS
1.0000 | ORAL_TABLET | ORAL | Status: DC | PRN
  Administered 2014-10-16 – 2014-10-20 (×6): 1 via ORAL
  Filled 2014-10-15 (×5): qty 1

## 2014-10-15 MED ORDER — CALCIUM ACETATE (PHOS BINDER) 667 MG PO CAPS: 1334.0000 mg | ORAL_CAPSULE | ORAL | Status: DC

## 2014-10-15 NOTE — ED Notes (Signed)
Pt to ED via [facility] with complaint of abdominal pain x2 days, reports fatigue; dialysis pt, last dialysis yesterday, dialysis access left arm present. Pt was encouraged by dialysis staff yesterday to come into ED for evaluation, however pt refused. A/o x4. VS - 171/76, HR 72 regular LBBB, 26-28 RR, 98% on RA. CBG 163.

## 2014-10-15 NOTE — ED Notes (Signed)
Phlebotomy at bedside attempting to obtain blood cultures

## 2014-10-15 NOTE — ED Provider Notes (Signed)
Imaging as below. Unfortunately CT not done with contrast. On DRE prostate enlarged and very tender. I could not appreciate any fluctuance. Leukocytosis of 18. ESRD on dialysis. No UA. Will start abx and discuss with urology.   Discussed with Dr Retta Diones, urology. Fine with rocephin. Will see in consultation.  Troponin is mildly elevated. Not sure of clinical significance of this as I cannot clearly relate to presenting symptoms today and patient also has ESRD.   Discussed with Dr Sharyn Lull. Says will be available to see in consultation if needed. Will discuss with hospitalist service for admission.     Raeford Razor, MD 10/16/14 1538

## 2014-10-15 NOTE — H&P (Signed)
PCP: Rinaldo Cloud, MD  [facility] Fabienne Bruns Referring provider [place]   Chief Complaint: Abdominal pain  HPI: Greg [name] is a 69 y.o. male   has a past medical history of ESRD on hemodialysis (HCC); Cirrhosis (HCC); Alcohol abuse; Stroke [facility]); Hypertension; Diabetes mellitus; Hepatitis; Constipation; Coronary artery disease; and Hypercholesterolemia.   Presented with  Patient had been having suprapubic abdominal pain for past 2 days. This was associated with nausea and mild vomiting no diarrhea.  Patient has history of end-stage renal disease in the setting of diabetes. He is currently on dialysis Tuesday Thursday and Saturday last dialysis was yesterday on Tuesday. He reports no problem with HD. He was encouraged by dialysis staff to present to emergency department but waited until today. Patient is still makes occasional urine. She did not have any fevers or chills. Patient denies any chest pain or shortness of breath. In emergency department CT scan ordered abdomen was done showing marked prostatomegaly with rounded area of internal low-attenuation measuring up to 2.6 cm concerning for acute prostatitis potentially  Abscess. Case was discussed with urology Dr. Allene Dillon command admission to hospitalist team and they'll follow up in consult. Of note in emergency department he was found to have elevated troponin up to 0.35. Of note patient has had   elevated troponin in August was as high as 3.21 no results prior to august.  Patient is chest pain-free. Denies any syncope or palpitations. Patient also was discussed with patient's cardiologist Dr. Sharyn Lull 's request and is planned admission to medicine and he can be consulted in the morning if needed  Of note patient was admitted from August 13 to August 21 for Escherichia coli sepsis with source being dialysis catheter. Since then his dialysis catheter has been removed. Patient was discharged home on meropenem for 2 weeks  that she should've completed. He was seen by vascular surgery who felt that he is now ready to use his fistula and this has been used at dialysis.  He has hx of chronic diabetic foot with dry gangrene followed by Vascular surgery   Reports no wound care clinic. But states that Dr. Lorenza Evangelist sees him once a weak.   Hospitalist was called for admission for prostatitis  Review of Systems:    Pertinent positives include:  fatigue, abdominal pain, nausea, vomiting  Constitutional:  No weight loss, night sweats, Fevers, chills,weight loss  HEENT:  No headaches, Difficulty swallowing,Tooth/dental problems,Sore throat,  No sneezing, itching, ear ache, nasal congestion, post nasal drip,  Cardio-vascular:  No chest pain, Orthopnea, PND, anasarca, dizziness, palpitations.no Bilateral lower extremity swelling  GI:  No heartburn, indigestion,  , diarrhea, change in bowel habits, loss of appetite, melena, blood in stool, hematemesis Resp:  no shortness of breath at rest. No dyspnea on exertion, No excess mucus, no productive cough, No non-productive cough, No coughing up of blood.No change in color of mucus.No wheezing. Skin:  no rash or lesions. No jaundice GU:  no dysuria, change in color of urine, no urgency or frequency. No straining to urinate.  No flank pain.  Musculoskeletal:  No joint pain or no joint swelling. No decreased range of motion. No back pain.  Psych:  No change in mood or affect. No depression or anxiety. No memory loss.  Neuro: no localizing neurological complaints, no tingling, no weakness, no double vision, no gait abnormality, no slurred speech, no confusion  Otherwise ROS are negative except for above, 10 systems were reviewed  Past Medical History: Past  Medical History  Diagnosis Date  . ESRD on hemodialysis (HCC)     Started HD around 2006.   Marland Kitchen Cirrhosis (HCC)   . Alcohol abuse   . Stroke [facility])     left sided deficits  . Hypertension   . Diabetes mellitus      type 2   . Hepatitis     hep c  . Constipation   . Coronary artery disease   . Hypercholesterolemia    Past Surgical History  Procedure Laterality Date  . Cataract extraction w/ intraocular lens implant    . Av fistula placement Left 2006    upper arm  . Peripheral vascular catheterization N/A 06/06/2014    Procedure: Abdominal Aortogram;  Surgeon: Sherren Kerns, MD;  Location: [facility] INVASIVE CV LAB;  Service: Cardiovascular;  Laterality: N/A;  . Lower extremity angiogram Bilateral 06/06/2014    Procedure: Lower Extremity Angiogram;  Surgeon: Sherren Kerns, MD;  Location: [facility] INVASIVE CV LAB;  Service: Cardiovascular;  Laterality: Bilateral;  . Peripheral vascular catheterization Left 06/06/2014    Procedure: Peripheral Vascular Intervention;  Surgeon: Sherren Kerns, MD;  Location: [facility] INVASIVE CV LAB;  Service: Cardiovascular;  Laterality: Left;  pta drug coated x3  . Revison of arteriovenous fistula Left 08/04/2014    Procedure: PLICATION OF  LEFT UPPER ARM ARTERIOVENOUS FISTULA;  Surgeon: Sherren Kerns, MD;  Location: [facility] OR;  Service: Vascular;  Laterality: Left;  . Insertion of dialysis catheter Right 08/04/2014    Procedure: INSERTION OF DIALYSIS CATHETER RIGHT INTERNAL JUGULAR VEIN;  Surgeon: Sherren Kerns, MD;  Location: [facility] OR;  Service: Vascular;  Laterality: Right;  . Insertion of dialysis catheter Right 08/27/2014    Procedure: INSERTION OF DIALYSIS CATHETER RIGHT INTERNAL JUGULAR VEIN;  Surgeon: Pryor Ochoa, MD;  Location: [facility] OR;  Service: Vascular;  Laterality: Right;  . Esophagogastroduodenoscopy (egd) with propofol N/A 08/29/2014    Procedure: ESOPHAGOGASTRODUODENOSCOPY (EGD) WITH PROPOFOL;  Surgeon: Charlott Rakes, MD;  Location: [facility] ENDOSCOPY;  Service: Endoscopy;  Laterality: N/A;     Medications: Prior to Admission medications   Medication Sig Start Date End Date Taking? Authorizing Provider  amiodarone (PACERONE) 200 MG tablet Take 1 tablet (200 mg total) by  mouth daily. 08/30/14   Marinda Elk, MD  atorvastatin (LIPITOR) 40 MG tablet Take 40 mg by mouth at bedtime.    Historical Provider, MD  BD INSULIN SYRINGE ULTRAFINE 31G X 15/64" 0.5 ML MISC  08/01/14   Historical Provider, MD  calcium acetate (PHOSLO) 667 MG capsule Take 1,334 mg by mouth 2 (two) times daily with a meal.  03/26/14   Historical Provider, MD  clopidogrel (PLAVIX) 75 MG tablet Take 1 tablet (75 mg total) by mouth every other day. 09/08/14   Marinda Elk, MD  COREG CR 10 MG 24 hr capsule  07/17/14   Historical Provider, MD  docusate sodium (COLACE) 100 MG capsule Take 1 capsule (100 mg total) by mouth 2 (two) times daily. 07/10/13   Reuben Likes, MD  fluconazole (DIFLUCAN) 100 MG tablet Take 1 tablet (100 mg total) by mouth daily. Patient not taking: Reported on 09/10/2014 08/30/14   Marinda Elk, MD  gabapentin (NEURONTIN) 100 MG capsule  07/17/14   Historical Provider, MD  insulin NPH-insulin regular (HUMULIN 70/30) (70-30) 100 UNIT/ML injection Inject 10 Units into the skin daily.     Historical Provider, MD  lanthanum (FOSRENOL) 1000 MG chewable tablet Chew 1 tablet (1,000 mg total) by  mouth 3 (three) times daily with meals. 08/30/14   Marinda Elk, MD  lisinopril (PRINIVIL,ZESTRIL) 5 MG tablet  07/17/14   Historical Provider, MD  meropenem 500 mg in sodium chloride 0.9 % 50 mL Inject 500 mg into the vein every other day. 08/31/14   Marinda Elk, MD  Nutritional Supplements (FEEDING SUPPLEMENT, NEPRO CARB STEADY,) LIQD Take 237 mLs by mouth as needed (missed meal during dialysis.). 08/30/14   Marinda Elk, MD  ONE TOUCH ULTRA TEST test strip  08/10/14   Historical Provider, MD  oxyCODONE (OXY IR/ROXICODONE) 5 MG immediate release tablet Take 1 tablet (5 mg total) by mouth every 4 (four) hours as needed for moderate pain. Patient not taking: Reported on 09/10/2014 08/30/14   Marinda Elk, MD  oxyCODONE-acetaminophen (PERCOCET/ROXICET) 5-325 MG per  tablet Take 1 tablet by mouth every 4 (four) hours as needed for severe pain. 09/10/14   Fransisco Hertz, MD  pantoprazole (PROTONIX) 40 MG tablet Take 1 tablet (40 mg total) by mouth 2 (two) times daily. 08/30/14   Marinda Elk, MD  polyethylene glycol [facility] Ethelene Hal) packet Take 17 g by mouth daily as needed. 08/30/14   Marinda Elk, MD  sorbitol 70 % solution  07/17/14   Historical Provider, MD    Allergies:  No Known Allergies  Social History:  Ambulatory  Walker  Lives at home   With family     reports that he quit smoking about 8 years ago. He has never used smokeless tobacco. He reports that he does not drink alcohol or use illicit drugs.    Family History: family history includes Diabetes in his father and mother; Hypertension in his father and mother.    Physical Exam: Patient Vitals for the past 24 hrs:  BP Temp Temp src Pulse Resp SpO2  10/15/14 1930 196/84 mmHg - - 78 20 98 %  10/15/14 1815 186/65 mmHg - - 75 - 99 %  10/15/14 1800 181/67 mmHg - - 74 - 99 %  10/15/14 1730 184/71 mmHg - - 75 22 99 %  10/15/14 1715 188/65 mmHg - - 75 13 99 %  10/15/14 1645 184/64 mmHg - - 73 12 98 %  10/15/14 1630 184/63 mmHg - - 71 11 99 %  10/15/14 1600 181/58 mmHg - - 70 13 100 %  10/15/14 1541 183/65 mmHg 97.6 F (36.4 C) Oral 70 22 98 %  10/15/14 1500 187/68 mmHg - - 71 11 100 %  10/15/14 1430 186/70 mmHg - - 71 14 98 %  10/15/14 1415 179/64 mmHg - - - - 98 %  10/15/14 1400 176/66 mmHg - - 73 24 100 %  10/15/14 1345 171/64 mmHg - - 72 13 98 %  10/15/14 1335 176/66 mmHg 98.2 F (36.8 C) Oral 73 18 97 %    1. General:  in No Acute distress 2. Psychological: Alert and  Oriented 3. Head/ENT:    Dry Mucous Membranes                          Head Non traumatic, neck supple                          Normal  Dentition 4. SKIN:   decreased Skin turgor,  Skin clean Dry and intact no rash 5. Heart: Regular rate and rhythm no Murmur, Rub or gallop 6. Lungs: Clear to  auscultation bilaterally, no wheezes or crackles   7. Abdomen: Soft, non-tender, Non distended 8. Lower extremities: no clubbing, cyanosis, or edema 9. Neurologically Grossly intact, moving all 4 extremities equally 10. MSK: Normal range of motion  body mass index is unknown because there is no weight on file.   Labs on Admission:   Results for orders placed or performed during the hospital encounter of 10/15/14 (from the past 24 hour(s))  Troponin I     Status: Abnormal   Collection Time: 10/15/14  2:27 PM  Result Value Ref Range   Troponin I 0.35 (H) <0.031 ng/mL  Comprehensive metabolic panel     Status: Abnormal   Collection Time: 10/15/14  2:27 PM  Result Value Ref Range   Sodium 132 (L) 135 - 145 mmol/L   Potassium 4.4 3.5 - 5.1 mmol/L   Chloride 91 (L) 101 - 111 mmol/L   CO2 29 22 - 32 mmol/L   Glucose, Bld 126 (H) 65 - 99 mg/dL   BUN 24 (H) 6 - 20 mg/dL   Creatinine, Ser 4.27 (H) 0.61 - 1.24 mg/dL   Calcium 8.3 (L) 8.9 - 10.3 mg/dL   Total Protein 6.7 6.5 - 8.1 g/dL   Albumin 1.9 (L) 3.5 - 5.0 g/dL   AST 27 15 - 41 U/L   ALT 9 (L) 17 - 63 U/L   Alkaline Phosphatase 132 (H) 38 - 126 U/L   Total Bilirubin 0.8 0.3 - 1.2 mg/dL   GFR calc non Af Amer 12 (L) >60 mL/min   GFR calc Af Amer 14 (L) >60 mL/min   Anion gap 12 5 - 15  CBC with Differential     Status: Abnormal   Collection Time: 10/15/14  2:27 PM  Result Value Ref Range   WBC 18.4 (H) 4.0 - 10.5 K/uL   RBC 3.57 (L) 4.22 - 5.81 MIL/uL   Hemoglobin 10.0 (L) 13.0 - 17.0 g/dL   HCT 06.2 (L) 37.6 - 28.3 %   MCV 86.3 78.0 - 100.0 fL   MCH 28.0 26.0 - 34.0 pg   MCHC 32.5 30.0 - 36.0 g/dL   RDW 15.1 (H) 76.1 - 60.7 %   Platelets 105 (L) 150 - 400 K/uL   Neutrophils Relative % 88 %   Neutro Abs 16.2 (H) 1.7 - 7.7 K/uL   Lymphocytes Relative 6 %   Lymphs Abs 1.0 0.7 - 4.0 K/uL   Monocytes Relative 6 %   Monocytes Absolute 1.1 (H) 0.1 - 1.0 K/uL   Eosinophils Relative 0 %   Eosinophils Absolute 0.0 0.0 - 0.7  K/uL   Basophils Relative 0 %   Basophils Absolute 0.0 0.0 - 0.1 K/uL    UA not obtained  Lab Results  Component Value Date   HGBA1C 5.3 08/24/2014    CrCl cannot be calculated (Unknown ideal weight.).  BNP (last 3 results) No results for input(s): PROBNP in the last 8760 hours.  Other results:  I have pearsonaly reviewed this: ECG REPORT  Rate: 73  Rhythm: Sinus rhythm ST&T Change: T wave inversion in the lateral leads is is a change from prior EKG in August QTC 496  There were no vitals filed for this visit.   Cultures:    Component Value Date/Time   SDES BLOOD RIGHT HAND 08/26/2014 1500   SPECREQUEST BOTTLES DRAWN AEROBIC AND ANAEROBIC 10CC 08/26/2014 1500   CULT NO GROWTH 5 DAYS 08/26/2014 1500   REPTSTATUS 08/31/2014 FINAL 08/26/2014 1500  Radiological Exams on Admission: Ct Abdomen Pelvis Wo Contrast  10/15/2014   CLINICAL DATA:  69 year old male with lower abdominal discomfort since yesterday  EXAM: CT ABDOMEN AND PELVIS WITHOUT CONTRAST  TECHNIQUE: Multidetector CT imaging of the abdomen and pelvis was performed following the standard protocol without IV contrast.  COMPARISON:  Prior abdominal radiographs 08/25/2014  FINDINGS: Lower Chest: Dependent atelectasis bilaterally slightly greater on the right than the left secondary to patient positioning. The heart is enlarged. Calcifications are present within the visualized coronary arteries. Partial calcification of the mitral valve annulus and aortic valve also noted. No pericardial effusion.  Abdomen: Unenhanced CT was performed per clinician order. Lack of IV contrast limits sensitivity and specificity, especially for evaluation of abdominal/pelvic solid viscera. Within nose limitations, unremarkable CT appearance of the stomach, duodenum, spleen, adrenal glands and pancreas. Normal hepatic contour and morphology. Atherosclerotic calcifications noted in the hepatic arteries. Numerous stones layer within the  gallbladder lumen. The gallbladder is mildly distended at nearly 4.9 cm. No definite gallbladder wall thickening or pericholecystic fluid.  The bilateral native kidneys are atrophic. There are numerous calcifications within the renal arteries. Numerous low-attenuation lesions bilaterally are incompletely characterized in the absence of intravenous contrast. The lesions are low in attenuation suggesting simple cysts.  No focal bowel wall thickening or evidence of obstruction. No suspicious adenopathy. Trace free fluid in the right pericolic gutter.  Pelvis: Marked prostatomegaly. The prostate gland measures 6.9 cm in diameter and is heterogeneous with internal areas of low attenuation. This is incompletely evaluated in the absence of intravenous contrast. The seminal vesicles are also enlarged and somewhat indistinct suggesting edema. Additionally, there is a small amounts of stranding extending from the seminal vesicles into the perirectal fat. The bladder is decompressed but appears circumferentially thick walled. This may reflect bladder trabeculation in the setting of outlet obstruction related to the prostatomegaly.  Bones/Soft Tissues: Gallbladder is unremarkable. No acute fracture or aggressive appearing lytic or blastic osseous lesion.  Vascular: Extensive calcification of all visualized arteries most consistent with medial arterial sclerosis. No aneurysm.  IMPRESSION: 1. Marked prostatomegaly with rounded areas of internal low-attenuation measuring up to 2.6 cm. In combination with bilateral enlargement of the edematous seminal vesicles and adjacent inflammatory stranding, these findings are concerning for acute prostatitis. The low-attenuation regions may represent areas of focal edema, or potentially abscesses. 2. Trabeculated bladder wall suggests associated bladder outlet obstruction. The bladder is not distended. 3. Trace free fluid in the right pericolic gutter is nonspecific but abnormal. This may be  secondary to an underlying infectious/inflammatory process of the small or large bowel which is otherwise occult by imaging. 4. Distended gallbladder containing sludge and small stones. 5. Borderline cardiomegaly. 6. Atrophic native kidneys. 7. Extensive atherosclerotic vascular calcifications consistent with medial arterial sclerosis which is often seen in the setting of longstanding chronic renal disease. These results were called by telephone at the time of interpretation on 10/15/2014 at 7:10 pm to Dr. Juleen China, who verbally acknowledged these results.   Electronically Signed   By: Malachy Moan M.D.   On: 10/15/2014 19:11   Dg Chest 2 View  10/15/2014   CLINICAL DATA:  69 year old male with abdominal pain for 2 days, fatigue. Dialysis yesterday. Initial encounter.  EXAM: CHEST  2 VIEW  COMPARISON:  08/27/2014 and earlier.  FINDINGS: Seated AP and lateral views of the chest. Mild calcified plaque along the right hemidiaphragm appears stable. Stable lung volumes. Stable cardiomegaly and mediastinal contours. Extensive calcified atherosclerosis of the aorta,  and including the great vessels on the right. Pulmonary vascularity is increased compared to prior studies. Right IJ approach dialysis catheter is been removed. No pneumothorax or pleural effusion. No consolidation. Osteopenia. No acute osseous abnormality identified.  IMPRESSION: Pulmonary vascular congestion / mild interstitial edema appears increased from prior studies. No definite pleural effusion and otherwise stable chest with cardiomegaly, and calcified aortic atherosclerosis.   Electronically Signed   By: Odessa Fleming M.D.   On: 10/15/2014 15:25    Chart has been reviewed  Family not  at  Bedside    Assessment/Plan  69 year old gentleman with history type 2 diabetes, essential hypertension, coronary artery disease, end-stage renal disease on dialysis Tuesdays Thursdays and Saturdays here with abdominal pain and CT scan worrisome for prostatitis  urology has been consulted. Of all also found to have elevated troponin currently chest pain-free.  Present on Admission:  . Prostatitis, acute - given recent hospitalization diabetes and patient with end-stage renal disease for started on broad-spectrum antibiotics. Appreciate urology consult. Defer to urology if possible abscess needs to be drained in which case culture should be obtained and antibiotics further narrow down. Per his prior admissions he was seen by infectious disease recommend consult during. This admission as well  . DM type 2 causing ESRD (HCC) continue on insulin but decrease the dose to 5 units a day while patient has decreased by mouth intake  . Essential hypertension continue home medications  . Coronary atherosclerosis continue statin. For now will hold off on Plavix in case patient needs a biopsy or drainage of the prostate  . Elevated troponin cycle cardiac enzymes monitor the trend. Would discuss with primary cardiologist in the morning   end-stage renal disease - discuss case with nephrology Dr. Juel Burrow is aware we'll put patient will list for hemodialysis tomorrow   Prophylaxis: SCD in case he needed drainage with potential labs  CODE STATUS:  FULL CODE patient is unable to decide at this time Disposition: To home once workup is complete and patient is stable, States not interested i placement  Other plan as per orders.  I have spent a total of 65 min on this admission extra time was taken to  discuss case with nephrology  Shaquana Buel 10/15/2014, 8:52 PM  [facility]  Pager [phone]   after 2 AM please page floor coverage PA If 7AM-7PM, please contact the day team taking care of the patient  [email or web address]  Password [email or web address]

## 2014-10-15 NOTE — Progress Notes (Signed)
ANTIBIOTIC CONSULT NOTE - INITIAL  Pharmacy Consult for Vancomycin and Zosyn  Indication: Prostatitic abcess  No Known Allergies  Patient Measurements: Weight: 133 lb (60.328 kg)   Vital Signs: Temp: 98.4 F (36.9 C) (10/05 2128) Temp Source: Oral (10/05 1541) BP: 183/63 mmHg (10/05 2128) Pulse Rate: 80 (10/05 2128) Intake/Output from previous day:   Intake/Output from this shift:    Labs:  Recent Labs  10/15/14 1427  WBC 18.4*  HGB 10.0*  PLT 105*  CREATININE 4.67*   Estimated Creatinine Clearance: 13.1 mL/min (by C-G formula based on Cr of 4.67). No results for input(s): VANCOTROUGH, VANCOPEAK, VANCORANDOM, GENTTROUGH, GENTPEAK, GENTRANDOM, TOBRATROUGH, TOBRAPEAK, TOBRARND, AMIKACINPEAK, AMIKACINTROU, AMIKACIN in the last 72 hours.   Microbiology: Recent Results (from the past 720 hour(s))  MRSA PCR Screening     Status: None   Collection Time: 10/15/14  9:49 PM  Result Value Ref Range Status   MRSA by PCR NEGATIVE NEGATIVE Final    Comment:        The GeneXpert MRSA Assay (FDA approved for NASAL specimens only), is one component of a comprehensive MRSA colonization surveillance program. It is not intended to diagnose MRSA infection nor to guide or monitor treatment for MRSA infections.     Medical History: Past Medical History  Diagnosis Date  . ESRD on hemodialysis (HCC)     Started HD around 2006.   Marland Kitchen Cirrhosis (HCC)   . Alcohol abuse   . Stroke [facility])     left sided deficits  . Hypertension   . Diabetes mellitus     type 2   . Hepatitis     hep c  . Constipation   . Coronary artery disease   . Hypercholesterolemia     Medications:  Prescriptions prior to admission  Medication Sig Dispense Refill Last Dose  . atorvastatin (LIPITOR) 40 MG tablet Take 40 mg by mouth at bedtime.   10/14/2014 at Unknown time  . calcium acetate (PHOSLO) 667 MG capsule Take 1,334-2,001 mg by mouth See admin instructions. Take 3 capsules by mouth 3 times daily  and take 1 capsule by mouth with snacks twice daily  0 10/15/2014 at Unknown time  . COREG CR 10 MG 24 hr capsule Take 10 mg by mouth daily.    10/15/2014 at 800  . gabapentin (NEURONTIN) 100 MG capsule Take 100 mg by mouth daily.    10/15/2014 at Unknown time  . lisinopril (PRINIVIL,ZESTRIL) 5 MG tablet Take 5 mg by mouth daily.    10/15/2014 at Unknown time  . NOVOLIN 70/30 (70-30) 100 UNIT/ML injection Inject 10 Units into the skin daily.  0 10/15/2014 at Unknown time  . oxyCODONE-acetaminophen (PERCOCET/ROXICET) 5-325 MG per tablet Take 1 tablet by mouth every 4 (four) hours as needed for severe pain. 10 tablet 0 10/15/2014 at Unknown time  . amiodarone (PACERONE) 200 MG tablet Take 1 tablet (200 mg total) by mouth daily. (Patient not taking: Reported on 10/15/2014) 30 tablet 0 Not Taking at Unknown time  . clopidogrel (PLAVIX) 75 MG tablet Take 1 tablet (75 mg total) by mouth every other day. 30 tablet 0 Taking  . docusate sodium (COLACE) 100 MG capsule Take 1 capsule (100 mg total) by mouth 2 (two) times daily. 10 capsule 0 Taking  . fluconazole (DIFLUCAN) 100 MG tablet Take 1 tablet (100 mg total) by mouth daily. (Patient not taking: Reported on 09/10/2014) 7 tablet 0 Not Taking at Unknown time  . lanthanum (FOSRENOL) 1000 MG chewable tablet  Chew 1 tablet (1,000 mg total) by mouth 3 (three) times daily with meals. 90 tablet 0 Taking  . meropenem 500 mg in sodium chloride 0.9 % 50 mL Inject 500 mg into the vein every other day. 10 Bottle 0 Taking  . Nutritional Supplements (FEEDING SUPPLEMENT, NEPRO CARB STEADY,) LIQD Take 237 mLs by mouth as needed (missed meal during dialysis.).  0 Taking  . oxyCODONE (OXY IR/ROXICODONE) 5 MG immediate release tablet Take 1 tablet (5 mg total) by mouth every 4 (four) hours as needed for moderate pain. (Patient not taking: Reported on 09/10/2014) 30 tablet 0 Not Taking at Unknown time  . pantoprazole (PROTONIX) 40 MG tablet Take 1 tablet (40 mg total) by mouth 2 (two)  times daily. 60 tablet 0 Taking  . polyethylene glycol (MIRALAX / GLYCOLAX) packet Take 17 g by mouth daily as needed. 14 each 0 Taking  . sorbitol 70 % solution       Assessment: 69 y.o. male with prostatitis, possible abcess, for empiric antibiotics  Goal of Therapy:  Vancomcyin pre-HD level 15-25  Plan:  Vancomycin 1250 mg IV now, then 500 mg IV after each HD Zosyn 2.25 g IV q8h  Greg [name], Greg [name] 10/15/2014,11:32 PM

## 2014-10-15 NOTE — ED Provider Notes (Signed)
CSN: [id number]     Arrival date & time 10/15/14  1322 History   First MD Initiated Contact with Patient 10/15/14 1336     Chief Complaint  Patient presents with  . Abdominal Pain     [employer] interpreter translation services utilized  HPI Patient presents to the emergency department because of lower abdominal discomfort which began yesterday.  He denies flank pain.  He denies nausea vomiting.  He still makes occasional urine.  He denies fevers and chills.  He's never had pain like this before.  He is a dialysis patient.  Last dialyzed yesterday.  He does report that earlier today he felt weak.  At one point there was report that he had a syncopal episode however now he is denying syncope.  He denies chest pain or chest pressure.  He reports generalized fatigue.   Past Medical History  Diagnosis Date  . ESRD on hemodialysis (HCC)     Started HD around 2006.   Marland Kitchen Cirrhosis (HCC)   . Alcohol abuse   . Stroke [facility])     left sided deficits  . Hypertension   . Diabetes mellitus     type 2   . Hepatitis     hep c  . Constipation   . Coronary artery disease   . Hypercholesterolemia    Past Surgical History  Procedure Laterality Date  . Cataract extraction w/ intraocular lens implant    . Av fistula placement Left 2006    upper arm  . Peripheral vascular catheterization N/A 06/06/2014    Procedure: Abdominal Aortogram;  Surgeon: Sherren Kerns, MD;  Location: [facility] INVASIVE CV LAB;  Service: Cardiovascular;  Laterality: N/A;  . Lower extremity angiogram Bilateral 06/06/2014    Procedure: Lower Extremity Angiogram;  Surgeon: Sherren Kerns, MD;  Location: [facility] INVASIVE CV LAB;  Service: Cardiovascular;  Laterality: Bilateral;  . Peripheral vascular catheterization Left 06/06/2014    Procedure: Peripheral Vascular Intervention;  Surgeon: Sherren Kerns, MD;  Location: [facility] INVASIVE CV LAB;  Service: Cardiovascular;  Laterality: Left;  pta drug coated x3  . Revison of arteriovenous fistula  Left 08/04/2014    Procedure: PLICATION OF  LEFT UPPER ARM ARTERIOVENOUS FISTULA;  Surgeon: Sherren Kerns, MD;  Location: [facility] OR;  Service: Vascular;  Laterality: Left;  . Insertion of dialysis catheter Right 08/04/2014    Procedure: INSERTION OF DIALYSIS CATHETER RIGHT INTERNAL JUGULAR VEIN;  Surgeon: Sherren Kerns, MD;  Location: [facility] OR;  Service: Vascular;  Laterality: Right;  . Insertion of dialysis catheter Right 08/27/2014    Procedure: INSERTION OF DIALYSIS CATHETER RIGHT INTERNAL JUGULAR VEIN;  Surgeon: Pryor Ochoa, MD;  Location: [facility] OR;  Service: Vascular;  Laterality: Right;  . Esophagogastroduodenoscopy (egd) with propofol N/A 08/29/2014    Procedure: ESOPHAGOGASTRODUODENOSCOPY (EGD) WITH PROPOFOL;  Surgeon: Charlott Rakes, MD;  Location: [facility] ENDOSCOPY;  Service: Endoscopy;  Laterality: N/A;   Family History  Problem Relation Age of Onset  . Diabetes Mother   . Hypertension Mother   . Diabetes Father   . Hypertension Father    Social History  Substance Use Topics  . Smoking status: Former Smoker    Quit date: 01/10/2006  . Smokeless tobacco: Never Used  . Alcohol Use: No     Comment: Former    Review of Systems  All other systems reviewed and are negative.     Allergies  Review of patient's allergies indicates no known allergies.  Home Medications   Prior to  Admission medications   Medication Sig Start Date End Date Taking? Authorizing Provider  amiodarone (PACERONE) 200 MG tablet Take 1 tablet (200 mg total) by mouth daily. 08/30/14   Marinda Elk, MD  atorvastatin (LIPITOR) 40 MG tablet Take 40 mg by mouth at bedtime.    Historical Provider, MD  BD INSULIN SYRINGE ULTRAFINE 31G X 15/64" 0.5 ML MISC  08/01/14   Historical Provider, MD  calcium acetate (PHOSLO) 667 MG capsule Take 1,334 mg by mouth 2 (two) times daily with a meal.  03/26/14   Historical Provider, MD  clopidogrel (PLAVIX) 75 MG tablet Take 1 tablet (75 mg total) by mouth every other day.  09/08/14   Marinda Elk, MD  COREG CR 10 MG 24 hr capsule  07/17/14   Historical Provider, MD  docusate sodium (COLACE) 100 MG capsule Take 1 capsule (100 mg total) by mouth 2 (two) times daily. 07/10/13   Reuben Likes, MD  fluconazole (DIFLUCAN) 100 MG tablet Take 1 tablet (100 mg total) by mouth daily. Patient not taking: Reported on 09/10/2014 08/30/14   Marinda Elk, MD  gabapentin (NEURONTIN) 100 MG capsule  07/17/14   Historical Provider, MD  insulin NPH-insulin regular (HUMULIN 70/30) (70-30) 100 UNIT/ML injection Inject 10 Units into the skin daily.     Historical Provider, MD  lanthanum (FOSRENOL) 1000 MG chewable tablet Chew 1 tablet (1,000 mg total) by mouth 3 (three) times daily with meals. 08/30/14   Marinda Elk, MD  lisinopril (PRINIVIL,ZESTRIL) 5 MG tablet  07/17/14   Historical Provider, MD  meropenem 500 mg in sodium chloride 0.9 % 50 mL Inject 500 mg into the vein every other day. 08/31/14   Marinda Elk, MD  Nutritional Supplements (FEEDING SUPPLEMENT, NEPRO CARB STEADY,) LIQD Take 237 mLs by mouth as needed (missed meal during dialysis.). 08/30/14   Marinda Elk, MD  ONE TOUCH ULTRA TEST test strip  08/10/14   Historical Provider, MD  oxyCODONE (OXY IR/ROXICODONE) 5 MG immediate release tablet Take 1 tablet (5 mg total) by mouth every 4 (four) hours as needed for moderate pain. Patient not taking: Reported on 09/10/2014 08/30/14   Marinda Elk, MD  oxyCODONE-acetaminophen (PERCOCET/ROXICET) 5-325 MG per tablet Take 1 tablet by mouth every 4 (four) hours as needed for severe pain. 09/10/14   Fransisco Hertz, MD  pantoprazole (PROTONIX) 40 MG tablet Take 1 tablet (40 mg total) by mouth 2 (two) times daily. 08/30/14   Marinda Elk, MD  polyethylene glycol [facility] Ethelene Hal) packet Take 17 g by mouth daily as needed. 08/30/14   Marinda Elk, MD  sorbitol 70 % solution  07/17/14   Historical Provider, MD   BP 179/64 mmHg  Pulse 73  Temp(Src)  98.2 F (36.8 C) (Oral)  Resp 24  SpO2 98% Physical Exam  Constitutional: He is oriented to person, place, and time. He appears well-developed and well-nourished.  HENT:  Head: Normocephalic and atraumatic.  Eyes: EOM are normal.  Neck: Normal range of motion.  Cardiovascular: Normal rate, regular rhythm, normal heart sounds and intact distal pulses.   Pulmonary/Chest: Effort normal and breath sounds normal. No respiratory distress.  Abdominal: Soft. He exhibits no distension.  Right lower quadrant tenderness without guarding or rebound.  Musculoskeletal: Normal range of motion.  Neurological: He is alert and oriented to person, place, and time.  Skin: Skin is warm and dry.  Psychiatric: He has a normal mood and affect. Judgment normal.  Nursing  note and vitals reviewed.   ED Course  Procedures (including critical care time) Labs Review Labs Reviewed  TROPONIN I - Abnormal; Notable for the following:    Troponin I 0.35 (*)    All other components within normal limits  COMPREHENSIVE METABOLIC PANEL - Abnormal; Notable for the following:    Sodium 132 (*)    Chloride 91 (*)    Glucose, Bld 126 (*)    BUN 24 (*)    Creatinine, Ser 4.67 (*)    Calcium 8.3 (*)    Albumin 1.9 (*)    ALT 9 (*)    Alkaline Phosphatase 132 (*)    GFR calc non Af Amer 12 (*)    GFR calc Af Amer 14 (*)    All other components within normal limits  CBC WITH DIFFERENTIAL/PLATELET - Abnormal; Notable for the following:    WBC 18.4 (*)    RBC 3.57 (*)    Hemoglobin 10.0 (*)    HCT 30.8 (*)    RDW 18.4 (*)    Platelets 105 (*)    Neutro Abs 16.2 (*)    Monocytes Absolute 1.1 (*)    All other components within normal limits    Imaging Review Dg Chest 2 View  10/15/2014   CLINICAL DATA:  69 year old male with abdominal pain for 2 days, fatigue. Dialysis yesterday. Initial encounter.  EXAM: CHEST  2 VIEW  COMPARISON:  08/27/2014 and earlier.  FINDINGS: Seated AP and lateral views of the chest.  Mild calcified plaque along the right hemidiaphragm appears stable. Stable lung volumes. Stable cardiomegaly and mediastinal contours. Extensive calcified atherosclerosis of the aorta, and including the great vessels on the right. Pulmonary vascularity is increased compared to prior studies. Right IJ approach dialysis catheter is been removed. No pneumothorax or pleural effusion. No consolidation. Osteopenia. No acute osseous abnormality identified.  IMPRESSION: Pulmonary vascular congestion / mild interstitial edema appears increased from prior studies. No definite pleural effusion and otherwise stable chest with cardiomegaly, and calcified aortic atherosclerosis.   Electronically Signed   By: Odessa Fleming M.D.   On: 10/15/2014 15:25   I have personally reviewed and evaluated these images and lab results as part of my medical decision-making.   EKG Interpretation   Date/Time:  Wednesday October 15 2014 14:21:43 EDT Ventricular Rate:  73 PR Interval:  204 QRS Duration: 109 QT Interval:  447 QTC Calculation: 493 R Axis:   -3 Text Interpretation:  Sinus rhythm Inferior infarct, age indeterminate  Probable posterior infarct, recent Abnrm T, consider ischemia,  anterolateral lds nonspecific T wave changes compared to earlier ecg  Confirmed by Abdikadir Fohl  MD, Caryn Bee ([id number]) on 10/15/2014 3:53:25 PM      MDM   Final diagnoses:  None   Patient with tenderness in the right lower quadrant.  Patient be given pain medication this time and undergo CT imaging to better define the etiology of his pain.  Initially there was a history obtained which reported syncope without chest pain or chest pressure or palpitations.  EKG demonstrates nonspecific inferior lateral T wave inversions.  Troponin was obtained and there is a nonspecific elevation in his troponin.  He has had elevated troponins performed the past.  He denies all chest pain or chest pressure this time.  When a detailed history was obtained utilizing  translator services he denies syncope and palpitations.  He denies chest pain or pressure at all today.  Care to Dr Juleen China to follow up with CT abd/pelvis  Azalia Bilis, MD 10/15/14 [phone]

## 2014-10-15 NOTE — ED Notes (Signed)
Per pt request- family called and updated on plan to admit patient

## 2014-10-16 DIAGNOSIS — E43 Unspecified severe protein-calorie malnutrition: Secondary | ICD-10-CM

## 2014-10-16 LAB — COMPREHENSIVE METABOLIC PANEL
ALBUMIN: 1.8 g/dL — AB (ref 3.5–5.0)
ALT: 7 U/L — ABNORMAL LOW (ref 17–63)
AST: 17 U/L (ref 15–41)
Alkaline Phosphatase: 122 U/L (ref 38–126)
Anion gap: 15 (ref 5–15)
BUN: 27 mg/dL — AB (ref 6–20)
CHLORIDE: 91 mmol/L — AB (ref 101–111)
CO2: 27 mmol/L (ref 22–32)
Calcium: 8.2 mg/dL — ABNORMAL LOW (ref 8.9–10.3)
Creatinine, Ser: 5.06 mg/dL — ABNORMAL HIGH (ref 0.61–1.24)
GFR calc Af Amer: 12 mL/min — ABNORMAL LOW (ref >60)
GFR calc non Af Amer: 11 mL/min — ABNORMAL LOW (ref >60)
GLUCOSE: 98 mg/dL (ref 65–99)
POTASSIUM: 3.8 mmol/L (ref 3.5–5.1)
Sodium: 133 mmol/L — ABNORMAL LOW (ref 135–145)
Total Bilirubin: 1.1 mg/dL (ref 0.3–1.2)
Total Protein: 6.3 g/dL — ABNORMAL LOW (ref 6.5–8.1)

## 2014-10-16 LAB — CBC
HEMATOCRIT: 30.7 % — AB (ref 39.0–52.0)
Hemoglobin: 9.9 g/dL — ABNORMAL LOW (ref 13.0–17.0)
MCH: 27.8 pg (ref 26.0–34.0)
MCHC: 32.2 g/dL (ref 30.0–36.0)
MCV: 86.2 fL (ref 78.0–100.0)
Platelets: 115 10*3/uL — ABNORMAL LOW (ref 150–400)
RBC: 3.56 MIL/uL — ABNORMAL LOW (ref 4.22–5.81)
RDW: 18.3 % — AB (ref 11.5–15.5)
WBC: 17.5 10*3/uL — ABNORMAL HIGH (ref 4.0–10.5)

## 2014-10-16 LAB — GLUCOSE, CAPILLARY
GLUCOSE-CAPILLARY: 91 mg/dL (ref 65–99)
GLUCOSE-CAPILLARY: 97 mg/dL (ref 65–99)
GLUCOSE-CAPILLARY: 80 mg/dL (ref 65–99)
Glucose-Capillary: 97 mg/dL (ref 65–99)
Glucose-Capillary: 94 mg/dL (ref 65–99)

## 2014-10-16 LAB — TSH: TSH: 0.951 u[IU]/mL (ref 0.350–4.500)

## 2014-10-16 LAB — MAGNESIUM: Magnesium: 2.2 mg/dL (ref 1.7–2.4)

## 2014-10-16 LAB — TROPONIN I
TROPONIN I: 0.22 ng/mL — AB (ref <0.031)
TROPONIN I: 0.19 ng/mL — AB (ref <0.031)
Troponin I: 0.25 ng/mL — ABNORMAL HIGH (ref <0.031)

## 2014-10-16 LAB — LACTIC ACID, PLASMA: Lactic Acid, Venous: 1.3 mmol/L (ref 0.5–2.0)

## 2014-10-16 LAB — PHOSPHORUS: PHOSPHORUS: 4.3 mg/dL (ref 2.5–4.6)

## 2014-10-16 MED ORDER — HEPARIN SODIUM (PORCINE) 1000 UNIT/ML DIALYSIS: 1000.0000 [IU] | INTRAMUSCULAR | Status: DC | PRN

## 2014-10-16 MED ORDER — HEPARIN SODIUM (PORCINE) 5000 UNIT/ML IJ SOLN
5000.0000 [IU] | Freq: Three times a day (TID) | INTRAMUSCULAR | Status: DC
  Administered 2014-10-16 – 2014-10-19 (×10): 5000 [IU] via SUBCUTANEOUS
  Filled 2014-10-16 (×8): qty 1

## 2014-10-16 MED ORDER — LIDOCAINE-PRILOCAINE 2.5-2.5 % EX CREA
1.0000 "application " | TOPICAL_CREAM | CUTANEOUS | Status: DC | PRN
  Filled 2014-10-16: qty 5

## 2014-10-16 MED ORDER — SODIUM CHLORIDE 0.9 % IV SOLN
62.5000 mg | INTRAVENOUS | Status: DC
  Administered 2014-10-16: 62.5 mg via INTRAVENOUS
  Filled 2014-10-16 (×2): qty 5

## 2014-10-16 MED ORDER — RENA-VITE PO TABS
1.0000 | ORAL_TABLET | Freq: Every day | ORAL | Status: DC
  Administered 2014-10-17 – 2014-10-19 (×3): 1 via ORAL
  Filled 2014-10-16 (×4): qty 1

## 2014-10-16 MED ORDER — CINACALCET HCL 30 MG PO TABS
180.0000 mg | ORAL_TABLET | Freq: Every day | ORAL | Status: DC
  Administered 2014-10-16 – 2014-10-20 (×5): 180 mg via ORAL
  Filled 2014-10-16 (×5): qty 6

## 2014-10-16 MED ORDER — INSULIN ASPART 100 UNIT/ML ~~LOC~~ SOLN: 0.0000 [IU] | Freq: Every day | SUBCUTANEOUS | Status: DC

## 2014-10-16 MED ORDER — SODIUM CHLORIDE 0.9 % IV SOLN: 100.0000 mL | INTRAVENOUS | Status: DC | PRN

## 2014-10-16 MED ORDER — ALTEPLASE 2 MG IJ SOLR
2.0000 mg | Freq: Once | INTRAMUSCULAR | Status: DC | PRN
  Filled 2014-10-16: qty 2

## 2014-10-16 MED ORDER — PIPERACILLIN-TAZOBACTAM IN DEX 2-0.25 GM/50ML IV SOLN
2.2500 g | Freq: Three times a day (TID) | INTRAVENOUS | Status: DC
  Administered 2014-10-16 – 2014-10-17 (×2): 2.25 g via INTRAVENOUS
  Filled 2014-10-16 (×4): qty 50

## 2014-10-16 MED ORDER — DARBEPOETIN ALFA 60 MCG/0.3ML IJ SOSY
PREFILLED_SYRINGE | INTRAMUSCULAR | Status: AC
  Filled 2014-10-16: qty 0.3

## 2014-10-16 MED ORDER — INSULIN ASPART 100 UNIT/ML ~~LOC~~ SOLN
0.0000 [IU] | Freq: Three times a day (TID) | SUBCUTANEOUS | Status: DC
  Administered 2014-10-19: 1 [IU] via SUBCUTANEOUS
  Administered 2014-10-20: 2 [IU] via SUBCUTANEOUS

## 2014-10-16 MED ORDER — CETYLPYRIDINIUM CHLORIDE 0.05 % MT LIQD
7.0000 mL | Freq: Two times a day (BID) | OROMUCOSAL | Status: DC
  Administered 2014-10-16 – 2014-10-20 (×8): 7 mL via OROMUCOSAL

## 2014-10-16 MED ORDER — DARBEPOETIN ALFA 60 MCG/0.3ML IJ SOSY
60.0000 ug | PREFILLED_SYRINGE | INTRAMUSCULAR | Status: DC
  Administered 2014-10-16: 60 ug via INTRAVENOUS
  Filled 2014-10-16: qty 0.3

## 2014-10-16 MED ORDER — HEPARIN SODIUM (PORCINE) 1000 UNIT/ML DIALYSIS: 1900.0000 [IU] | Freq: Once | INTRAMUSCULAR | Status: DC

## 2014-10-16 MED ORDER — PENTAFLUOROPROP-TETRAFLUOROETH EX AERO: 1.0000 "application " | INHALATION_SPRAY | CUTANEOUS | Status: DC | PRN

## 2014-10-16 MED ORDER — OXYCODONE-ACETAMINOPHEN 5-325 MG PO TABS
ORAL_TABLET | ORAL | Status: AC
  Filled 2014-10-16: qty 1

## 2014-10-16 MED ORDER — LIDOCAINE HCL (PF) 1 % IJ SOLN: 5.0000 mL | INTRAMUSCULAR | Status: DC | PRN

## 2014-10-16 NOTE — Consult Note (Signed)
Urology Consult  Consulting LE:XNTZGY  CC: Prostatic abscess  HPI: This is a 69 year old male with multiple medical issues including end-stage renal disease/hemodialysis, hepatitis C with services, history of right-sided CVA, hypertension, diabetes, coronary artery disease, hypercholesterolemia who was admitted yesterday to the medical service with a month-long history of suprapubic pain. History is quite difficult to get this gentleman, and his family member, at his bedside, will not wake up. He was found, on evaluation of his abdominal/suprapubic pain, to have a prostatic abscess. He did have pyuria. He is admitted for management of this. The patient states that the pain has been there for about a month. He thinks it is better now than in the past few days, but again, history giving is somewhat suspect. The patient states that he has not had fever or chills. In his record, I can see that he has recently been on antibiotic for a dialysis catheter Escherichia coli infection. Apparently, he was on meropenem.  PMH: Past Medical History  Diagnosis Date  . ESRD on hemodialysis (HCC)     Started HD around 2006.   Marland Kitchen Cirrhosis (HCC)   . Alcohol abuse   . Stroke [facility])     left sided deficits  . Hypertension   . Diabetes mellitus     type 2   . Hepatitis     hep c  . Constipation   . Coronary artery disease   . Hypercholesterolemia     PSH: Past Surgical History  Procedure Laterality Date  . Cataract extraction w/ intraocular lens implant    . Av fistula placement Left 2006    upper arm  . Peripheral vascular catheterization N/A 06/06/2014    Procedure: Abdominal Aortogram;  Surgeon: Sherren Kerns, MD;  Location: [facility] INVASIVE CV LAB;  Service: Cardiovascular;  Laterality: N/A;  . Lower extremity angiogram Bilateral 06/06/2014    Procedure: Lower Extremity Angiogram;  Surgeon: Sherren Kerns, MD;  Location: [facility] INVASIVE CV LAB;  Service: Cardiovascular;  Laterality: Bilateral;  .  Peripheral vascular catheterization Left 06/06/2014    Procedure: Peripheral Vascular Intervention;  Surgeon: Sherren Kerns, MD;  Location: [facility] INVASIVE CV LAB;  Service: Cardiovascular;  Laterality: Left;  pta drug coated x3  . Revison of arteriovenous fistula Left 08/04/2014    Procedure: PLICATION OF  LEFT UPPER ARM ARTERIOVENOUS FISTULA;  Surgeon: Sherren Kerns, MD;  Location: [facility] OR;  Service: Vascular;  Laterality: Left;  . Insertion of dialysis catheter Right 08/04/2014    Procedure: INSERTION OF DIALYSIS CATHETER RIGHT INTERNAL JUGULAR VEIN;  Surgeon: Sherren Kerns, MD;  Location: [facility] OR;  Service: Vascular;  Laterality: Right;  . Insertion of dialysis catheter Right 08/27/2014    Procedure: INSERTION OF DIALYSIS CATHETER RIGHT INTERNAL JUGULAR VEIN;  Surgeon: Pryor Ochoa, MD;  Location: [facility] OR;  Service: Vascular;  Laterality: Right;  . Esophagogastroduodenoscopy (egd) with propofol N/A 08/29/2014    Procedure: ESOPHAGOGASTRODUODENOSCOPY (EGD) WITH PROPOFOL;  Surgeon: Charlott Rakes, MD;  Location: [facility] ENDOSCOPY;  Service: Endoscopy;  Laterality: N/A;    Allergies: No Known Allergies  Medications: Prescriptions prior to admission  Medication Sig Dispense Refill Last Dose  . atorvastatin (LIPITOR) 40 MG tablet Take 40 mg by mouth at bedtime.   10/14/2014 at Unknown time  . calcium acetate (PHOSLO) 667 MG capsule Take 1,334-2,001 mg by mouth See admin instructions. Take 3 capsules by mouth 3 times daily and take 1 capsule by mouth with snacks twice daily  0 10/15/2014 at  Unknown time  . COREG CR 10 MG 24 hr capsule Take 10 mg by mouth daily.    10/15/2014 at 800  . gabapentin (NEURONTIN) 100 MG capsule Take 100 mg by mouth daily.    10/15/2014 at Unknown time  . lisinopril (PRINIVIL,ZESTRIL) 5 MG tablet Take 5 mg by mouth daily.    10/15/2014 at Unknown time  . NOVOLIN 70/30 (70-30) 100 UNIT/ML injection Inject 10 Units into the skin daily.  0 10/15/2014 at Unknown time  .  oxyCODONE-acetaminophen (PERCOCET/ROXICET) 5-325 MG per tablet Take 1 tablet by mouth every 4 (four) hours as needed for severe pain. 10 tablet 0 10/15/2014 at Unknown time  . amiodarone (PACERONE) 200 MG tablet Take 1 tablet (200 mg total) by mouth daily. (Patient not taking: Reported on 10/15/2014) 30 tablet 0 Not Taking at Unknown time  . clopidogrel (PLAVIX) 75 MG tablet Take 1 tablet (75 mg total) by mouth every other day. 30 tablet 0 Taking  . docusate sodium (COLACE) 100 MG capsule Take 1 capsule (100 mg total) by mouth 2 (two) times daily. 10 capsule 0 Taking  . fluconazole (DIFLUCAN) 100 MG tablet Take 1 tablet (100 mg total) by mouth daily. (Patient not taking: Reported on 09/10/2014) 7 tablet 0 Not Taking at Unknown time  . lanthanum (FOSRENOL) 1000 MG chewable tablet Chew 1 tablet (1,000 mg total) by mouth 3 (three) times daily with meals. 90 tablet 0 Taking  . meropenem 500 mg in sodium chloride 0.9 % 50 mL Inject 500 mg into the vein every other day. 10 Bottle 0 Taking  . Nutritional Supplements (FEEDING SUPPLEMENT, NEPRO CARB STEADY,) LIQD Take 237 mLs by mouth as needed (missed meal during dialysis.).  0 Taking  . oxyCODONE (OXY IR/ROXICODONE) 5 MG immediate release tablet Take 1 tablet (5 mg total) by mouth every 4 (four) hours as needed for moderate pain. (Patient not taking: Reported on 09/10/2014) 30 tablet 0 Not Taking at Unknown time  . pantoprazole (PROTONIX) 40 MG tablet Take 1 tablet (40 mg total) by mouth 2 (two) times daily. 60 tablet 0 Taking  . polyethylene glycol (MIRALAX / GLYCOLAX) packet Take 17 g by mouth daily as needed. 14 each 0 Taking  . sorbitol 70 % solution         Social History: Social History   Social History  . Marital Status: Married    Spouse Name: N/A  . Number of Children: N/A  . Years of Education: N/A   Occupational History  . Not on file.   Social History Main Topics  . Smoking status: Former Smoker    Quit date: 01/10/2006  . Smokeless  tobacco: Never Used  . Alcohol Use: No     Comment: Former  . Drug Use: No  . Sexual Activity: Not on file   Other Topics Concern  . Not on file   Social History Narrative    Family History: Family History  Problem Relation Age of Onset  . Diabetes Mother   . Hypertension Mother   . Diabetes Father   . Hypertension Father     Review of Systems:  Positive:   A further 10 point review of systems was negative except what is listed in the HPI.  Physical Exam: @ General: No acute distress.  Awake. Head:  Normocephalic.  Atraumatic. ENT:  EOMI.  Mucous membranes moist Neck:  Supple.  No lymphadenopathy. Pulmonary: Equal effort bilaterally.  Clear to auscultation bilaterally. Abdomen: Soft.  Mild to moderate suprapubic tenderness.  No palpable mass. No rebound or guarding. Skin:  Normal turgor.  No visible rash. Extremity: No gross deformity of bilateral upper extremities.  No gross deformity of bilateral lower extremities. Neurologic: Alert. Appropriate mood.  Penis:  circumcised.  No lesions. Urethra: Orthotopic meatus. Scrotum: No lesions.  No ecchymosis.  No erythema. Testicles: Descended bilaterally.  No masses bilaterally. Epididymis: Palpable bilaterally.  Non Tender to palpation.  Rectal:  Prostate soft, tender, slightly fluctuant. No rectal masses.  Studies:  Recent Labs     10/15/14  1427  10/16/14  0520  HGB  10.0*  9.9*  WBC  18.4*  17.5*  PLT  105*  115*    Recent Labs     10/15/14  1427  10/16/14  0520  NA  132*  133*  K  4.4  3.8  CL  91*  91*  CO2  29  27  BUN  24*  27*  CREATININE  4.67*  5.06*  CALCIUM  8.3*  8.2*  GFRNONAA  12*  11*  GFRAA  14*  12*     No results for input(s): INR, APTT in the last 72 hours.  Invalid input(s): PT   Invalid input(s): ABG  CT scan was reviewed. There are decreased densities in the prostate, more on the left, consistent with prostatic abscess. Moderate amount of urine within the  bladder.  Assessment:  Prostatic abscess, patient currently not septic.  Plan: I agree with cultures, broad-spectrum antibiotic management. Prostatic abscess management requires, eventually, transurethral unroofing of the prosthetic abscess. At this point, as he is not septic and fairly stable, I would suggest antibiotic management for a few days followed by eventual transurethral unroofing of his abscess. That is done under anesthesia. I would like to get him stabilized, and perhaps take care of this early next week. If his situation destabilizes, we can do that sooner, but I would like cardiology to sign off on him prior to an anesthetic. We will continue to follow.    [phone]

## 2014-10-16 NOTE — Consult Note (Addendum)
WOC wound consult note Reason for Consult:Consult requested for bilat toes and left heel.  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 Wound type: Full thickness eschar to multiple toes on left and right.  Each area is approx .5X.5cm, dry and black, no odor or drainage. Left heel had previous deep tissue injury which has evolved into stage 2 pressure injury when previous 5X5cm dry scab is easily removed, revealing 2X2X.2cm stage 2 pressure injury.  No odor or drainage, pink and dry. Pressure Ulcer POA: Yes Float heel to reduce pressure.  Foam dressing to protect from further injury.  It is best practice to leave dry stable eschar intact to foot; Xeroform gauze to areas of toe eschar.  Pt can resume follow-up with his outpatient physician after discharge. Please re-consult if further assistance is needed.  Thank-you,  Cammie Mcgee MSN, RN, CWOCN, [place], CNS [phone]

## 2014-10-16 NOTE — Progress Notes (Signed)
[facility]                                                                              Patient Demographics  Greg [name], is a 69 y.o. male, DOB - Feb 02, 1945, RCV:[id number]  Admit date - 10/15/2014   Admitting Physician Therisa Doyne, MD  Outpatient Primary MD for the patient is Rinaldo Cloud, MD  LOS - 1   Chief Complaint  Patient presents with  . Abdominal Pain       Brief HPI   Greg [name] is a 69 y.o. Male with ESRD on hemodialysis TTS, Cirrhosis, Alcohol abuse; Stroke,  Hypertension; Diabetes mellitus; Hepatitis; Constipation; Coronary artery disease; and Hypercholesterolemia. Patient had been having suprapubic abdominal pain for past 2 days, associated with nausea, vomiting but no diarrhea.  Patient still makes occasional urine, denied any fevers or chills, chest pain or shortness of breath. CT abdomen and pelvis showed marked prostatomegaly with rounded area of internal low-attenuation measuring up to 2.6 cm concerning for acute prostatitis potentially Abscess. Urology was consulted in the ED and recommended admission. Also noted was elevated troponin of 0.35 but no chest pain. Patient denied any syncope or palpitations. Of note patient was admitted from 8/13-8/21 for Escherichia coli sepsis with source being dialysis catheter. Since then his dialysis catheter has been removed. Patient was discharged home on meropenem for 2 weeks which has been completed. Patient has been dialyzing with his fistula. He has hx of chronic diabetic foot with dry gangrene followed by Vascular surgery Reports no wound care clinic. But states that Dr. Lorenza Evangelist sees him once a weak.   Assessment & Plan    Principal Problem:   Prostatitis, acute - CT abdomen showed marked prostatomegaly with bilateral enlargement of the mediastinal vesicles and the adjacent inflammatory stranding, potentially abscesses or focal edema associated bladder outlet obstruction - Continue IV  Zosyn - Appreciate urology 's recommendations, discussed with Dr Lenn Sink this morning, recommended to continue antibiotics management for a few days and eventually OR for transurethral unroofing of the abscess in the prostrate likely on Monday. Urology requested for cardiology clearance prior to the surgery.  - Follow blood cultures    Active Problems:   Coronary atherosclerosis with elevated troponin; currently no chest pain or shortness of breath - Per patient, he is followed by Dr. Lorenza Evangelist, I have requested for consult. Troponins trending down.  - Recent 2-D echo 8/16 showed EF of 50% with mild LVH - Continue statin, ACE inhibitor, beta blocker, Plavix currently on hold for surgery  ESRD on hemodialysis TTS - Nephrology consult placed, will need dialysis today    Essential hypertension - continue coreg, lisinopril      CVA (cerebral vascular accident)  - Continue statin, Plavix currently on hold for surgery on Monday     Type 2 diabetes mellitus with complication, nephropathy  - Obtain hemoglobin A1c, Place on sliding scale insulin    Hyperlipidemia - Obtain lipid panel, continue statin    Protein-calorie malnutrition, severe -Placed on nutritional supplements, nutrition consult   Code Status: full code   Family Communication: Discussed in detail with the patient, all imaging results, lab  results explained to the patient. Family member at the bedside kept his eyes closed.    Disposition Plan: Not medically ready   Time Spent in minutes  25 mins   Procedures  CT abdomen   Consults   Nephrology   urology  DVT Prophylaxis   heparin  Medications  Scheduled Meds: . antiseptic oral rinse  7 mL Mouth Rinse BID  . atorvastatin  40 mg Oral QHS  . calcium acetate  2,001 mg Oral TID WC  . carvedilol  10 mg Oral Daily  . docusate sodium  100 mg Oral BID  . gabapentin  100 mg Oral Daily  . insulin aspart  0-9 Units Subcutaneous 6 times per day  . insulin aspart  protamine- aspart  5 Units Subcutaneous Q breakfast  . lanthanum  1,000 mg Oral TID WC  . lisinopril  5 mg Oral Daily  . pantoprazole  40 mg Oral BID  . piperacillin-tazobactam (ZOSYN)  IV  2.25 g Intravenous 3 times per day  . sodium chloride  3 mL Intravenous Q12H  . sodium chloride  3 mL Intravenous Q12H  . vancomycin  500 mg Intravenous Q T,Th,Sa-HD   Continuous Infusions:  PRN Meds:.sodium chloride, acetaminophen **OR** acetaminophen, calcium acetate, ondansetron (ZOFRAN) IV, ondansetron **OR** ondansetron (ZOFRAN) IV, oxyCODONE-acetaminophen, polyethylene glycol, sodium chloride   Antibiotics   Anti-infectives    Start     Dose/Rate Route Frequency Ordered Stop   10/16/14 1200  vancomycin (VANCOCIN) 500 mg in sodium chloride 0.9 % 100 mL IVPB     500 mg 100 mL/hr over 60 Minutes Intravenous Every T-Th-Sa (Hemodialysis) 10/15/14 2338     10/15/14 2359  vancomycin (VANCOCIN) 1,250 mg in sodium chloride 0.9 % 250 mL IVPB     1,250 mg 166.7 mL/hr over 90 Minutes Intravenous  Once 10/15/14 2338 10/16/14 0225   10/15/14 2359  piperacillin-tazobactam (ZOSYN) IVPB 2.25 g     2.25 g 100 mL/hr over 30 Minutes Intravenous 3 times per day 10/15/14 2338     10/15/14 1915  cefTRIAXone (ROCEPHIN) 1 g in dextrose 5 % 50 mL IVPB     1 g 100 mL/hr over 30 Minutes Intravenous  Once 10/15/14 1915 10/15/14 2037        Subjective:   Davis Ambrosini was seen and examined today.  abdominal pain improving today no fevers or chills, no nausea or vomiting. No chest pain. Patient denies dizziness, shortness of breath,  new weakness, numbess, tingling. No acute events overnight.    Objective:   Blood pressure 156/68, pulse 81, temperature 97.6 F (36.4 C), temperature source Oral, resp. rate 17, weight 60.328 kg (133 lb), SpO2 100 %.  Wt Readings from Last 3 Encounters:  10/15/14 60.328 kg (133 lb)  09/10/14 68.04 kg (150 lb)  08/31/14 63 kg (138 lb 14.2 oz)     Intake/Output Summary (Last 24  hours) at 10/16/14 0837 Last data filed at 10/16/14 0600  Gross per 24 hour  Intake    320 ml  Output      0 ml  Net    320 ml    Exam  General: Alert and oriented x 3, NAD  HEENT:  PERRLA, EOMI, Anicteric Sclera, mucous membranes moist.   Neck: Supple, no JVD, no masses  CVS: S1 S2 auscultated, no rubs, murmurs or gallops. Regular rate and rhythm.  Respiratory: Clear to auscultation bilaterally, no wheezing, rales or rhonchi  Abdomen: Soft, nontender, nondistended, + bowel sounds  Ext: no cyanosis  clubbing or edema  Neuro: AAOx3, Cr N's II- XII. Strength 5/5 upper and lower extremities bilaterally   Skin: No rashes  Psych: Normal affect and demeanor, alert and oriented x3    Data Review   Micro Results Recent Results (from the past 240 hour(s))  MRSA PCR Screening     Status: None   Collection Time: 10/15/14  9:49 PM  Result Value Ref Range Status   MRSA by PCR NEGATIVE NEGATIVE Final    Comment:        The GeneXpert MRSA Assay (FDA approved for NASAL specimens only), is one component of a comprehensive MRSA colonization surveillance program. It is not intended to diagnose MRSA infection nor to guide or monitor treatment for MRSA infections.     Radiology Reports Ct Abdomen Pelvis Wo Contrast  10/15/2014   CLINICAL DATA:  70 year old male with lower abdominal discomfort since yesterday  EXAM: CT ABDOMEN AND PELVIS WITHOUT CONTRAST  TECHNIQUE: Multidetector CT imaging of the abdomen and pelvis was performed following the standard protocol without IV contrast.  COMPARISON:  Prior abdominal radiographs 08/25/2014  FINDINGS: Lower Chest: Dependent atelectasis bilaterally slightly greater on the right than the left secondary to patient positioning. The heart is enlarged. Calcifications are present within the visualized coronary arteries. Partial calcification of the mitral valve annulus and aortic valve also noted. No pericardial effusion.  Abdomen: Unenhanced CT was  performed per clinician order. Lack of IV contrast limits sensitivity and specificity, especially for evaluation of abdominal/pelvic solid viscera. Within nose limitations, unremarkable CT appearance of the stomach, duodenum, spleen, adrenal glands and pancreas. Normal hepatic contour and morphology. Atherosclerotic calcifications noted in the hepatic arteries. Numerous stones layer within the gallbladder lumen. The gallbladder is mildly distended at nearly 4.9 cm. No definite gallbladder wall thickening or pericholecystic fluid.  The bilateral native kidneys are atrophic. There are numerous calcifications within the renal arteries. Numerous low-attenuation lesions bilaterally are incompletely characterized in the absence of intravenous contrast. The lesions are low in attenuation suggesting simple cysts.  No focal bowel wall thickening or evidence of obstruction. No suspicious adenopathy. Trace free fluid in the right pericolic gutter.  Pelvis: Marked prostatomegaly. The prostate gland measures 6.9 cm in diameter and is heterogeneous with internal areas of low attenuation. This is incompletely evaluated in the absence of intravenous contrast. The seminal vesicles are also enlarged and somewhat indistinct suggesting edema. Additionally, there is a small amounts of stranding extending from the seminal vesicles into the perirectal fat. The bladder is decompressed but appears circumferentially thick walled. This may reflect bladder trabeculation in the setting of outlet obstruction related to the prostatomegaly.  Bones/Soft Tissues: Gallbladder is unremarkable. No acute fracture or aggressive appearing lytic or blastic osseous lesion.  Vascular: Extensive calcification of all visualized arteries most consistent with medial arterial sclerosis. No aneurysm.  IMPRESSION: 1. Marked prostatomegaly with rounded areas of internal low-attenuation measuring up to 2.6 cm. In combination with bilateral enlargement of the  edematous seminal vesicles and adjacent inflammatory stranding, these findings are concerning for acute prostatitis. The low-attenuation regions may represent areas of focal edema, or potentially abscesses. 2. Trabeculated bladder wall suggests associated bladder outlet obstruction. The bladder is not distended. 3. Trace free fluid in the right pericolic gutter is nonspecific but abnormal. This may be secondary to an underlying infectious/inflammatory process of the small or large bowel which is otherwise occult by imaging. 4. Distended gallbladder containing sludge and small stones. 5. Borderline cardiomegaly. 6. Atrophic native kidneys. 7. Extensive atherosclerotic  vascular calcifications consistent with medial arterial sclerosis which is often seen in the setting of longstanding chronic renal disease. These results were called by telephone at the time of interpretation on 10/15/2014 at 7:10 pm to Dr. Juleen China, who verbally acknowledged these results.   Electronically Signed   By: Malachy Moan M.D.   On: 10/15/2014 19:11   Dg Chest 2 View  10/15/2014   CLINICAL DATA:  69 year old male with abdominal pain for 2 days, fatigue. Dialysis yesterday. Initial encounter.  EXAM: CHEST  2 VIEW  COMPARISON:  08/27/2014 and earlier.  FINDINGS: Seated AP and lateral views of the chest. Mild calcified plaque along the right hemidiaphragm appears stable. Stable lung volumes. Stable cardiomegaly and mediastinal contours. Extensive calcified atherosclerosis of the aorta, and including the great vessels on the right. Pulmonary vascularity is increased compared to prior studies. Right IJ approach dialysis catheter is been removed. No pneumothorax or pleural effusion. No consolidation. Osteopenia. No acute osseous abnormality identified.  IMPRESSION: Pulmonary vascular congestion / mild interstitial edema appears increased from prior studies. No definite pleural effusion and otherwise stable chest with cardiomegaly, and calcified  aortic atherosclerosis.   Electronically Signed   By: Odessa Fleming M.D.   On: 10/15/2014 15:25    CBC  Recent Labs Lab 10/15/14 1427 10/16/14 0520  WBC 18.4* 17.5*  HGB 10.0* 9.9*  HCT 30.8* 30.7*  PLT 105* 115*  MCV 86.3 86.2  MCH 28.0 27.8  MCHC 32.5 32.2  RDW 18.4* 18.3*  LYMPHSABS 1.0  --   MONOABS 1.1*  --   EOSABS 0.0  --   BASOSABS 0.0  --     Chemistries   Recent Labs Lab 10/15/14 1427 10/16/14 0520  NA 132* 133*  K 4.4 3.8  CL 91* 91*  CO2 29 27  GLUCOSE 126* 98  BUN 24* 27*  CREATININE 4.67* 5.06*  CALCIUM 8.3* 8.2*  MG  --  2.2  AST 27 17  ALT 9* 7*  ALKPHOS 132* 122  BILITOT 0.8 1.1   ------------------------------------------------------------------------------------------------------------------ estimated creatinine clearance is 12.1 mL/min (by C-G formula based on Cr of 5.06). ------------------------------------------------------------------------------------------------------------------ No results for input(s): HGBA1C in the last 72 hours. ------------------------------------------------------------------------------------------------------------------ No results for input(s): CHOL, HDL, LDLCALC, TRIG, CHOLHDL, LDLDIRECT in the last 72 hours. ------------------------------------------------------------------------------------------------------------------  Recent Labs  10/16/14 0520  TSH 0.951   ------------------------------------------------------------------------------------------------------------------ No results for input(s): VITAMINB12, FOLATE, FERRITIN, TIBC, IRON, RETICCTPCT in the last 72 hours.  Coagulation profile No results for input(s): INR, PROTIME in the last 168 hours.  No results for input(s): DDIMER in the last 72 hours.  Cardiac Enzymes  Recent Labs Lab 10/15/14 2211 10/15/14 2317 10/16/14 0520  TROPONINI 0.24* 0.25* 0.22*    ------------------------------------------------------------------------------------------------------------------ Invalid input(s): POCBNP   Recent Labs  10/15/14 2126 10/15/14 2344 10/16/14 0416 10/16/14 0732  GLUCAP 118* 115* 97 91     Deborra Phegley M.D. [facility] 10/16/2014, 8:37 AM  Pager: [phone] Between 7am to 7pm - call Pager - [phone]  After 7pm go to [email or web address] - password [email or web address]  Call night coverage person covering after 7pm

## 2014-10-16 NOTE — Progress Notes (Signed)
Late entry:   New Admission Note:   Arrival: Via bed from ED with tech Mental Orientation: A&Ox4, speaks little English, mostly Urdu per family Telemetry: Placed on box 6e03, NSR Assessment:  See doc flowsheet Skin: Numerous wounds noted on legs bilaterally.  Skin tear, diabetic ulcers, and DTI(?) noted and documented in assessment.  Old scab wounds all over lower legs and arms bilaterally.  Scar on back.  Skin assessment performed with Durenda Hurt, RN. IV: Right hand IV Pain: None at this time Safety Measures:  Call bell placed within reach; patient instructed on use of call bell and verbalized understanding. Bed in lowest position.  Non-skid socks on.  Bed alarm on. [address] Orientation: Patient oriented to staff, room, and unit. Family: Family in room  Orders have been reviewed and implemented.  Admission questions completed with help from family. Will continue to monitor.  Rozann Lesches, RN, BSN

## 2014-10-16 NOTE — Progress Notes (Signed)
Late Entry  Patient speaks Urdu as his primary language. Attempted to use [employer] phone line to communicate with patient regarding medications and physical assessment. Patient continuously repeated "Okay, I understand," in [place].   Leanna Battles, RN.

## 2014-10-16 NOTE — Consult Note (Signed)
Indication for Consultation:  Management of ESRD/hemodialysis; anemia, hypertension/volume and secondary hyperparathyroidism  HPI: Greg [name] is a 69 y.o. male who presented to the ED yesterday with complaints of abdominal pain. He receives HD TTS @ [facility], history of HTN, DM, stroke, admitted 8/13-8/20 for sepsis, he was also found to have ulcerative esophagitis and bleeding gastric and duodenal ulcers during that admission. He was seen by palliative care during that admission and family wanted to continue aggressive care despite his many medical problems and declinging health. He has been complaining of abdominal pain all week and was encouraged by staff at his outpt HD unit to come to the ED since Monday but he kept refusing. He was found to have acute prostatis and antibiotics were initiated. Urology is following and planning for OR Monday for transuretral unroofing of abscess. He was admitted for manangment. Today he denies any pain- said he is feeling well and only came to the hospital because he family brought him.Will arrange HD while admitted.   Past Medical History  Diagnosis Date  . ESRD on hemodialysis (HCC)     Started HD around 2006.   Marland Kitchen Cirrhosis (HCC)   . Alcohol abuse   . Stroke [facility])     left sided deficits  . Hypertension   . Diabetes mellitus     type 2   . Hepatitis     hep c  . Constipation   . Coronary artery disease   . Hypercholesterolemia    Past Surgical History  Procedure Laterality Date  . Cataract extraction w/ intraocular lens implant    . Av fistula placement Left 2006    upper arm  . Peripheral vascular catheterization N/A 06/06/2014    Procedure: Abdominal Aortogram;  Surgeon: Sherren Kerns, MD;  Location: [facility] INVASIVE CV LAB;  Service: Cardiovascular;  Laterality: N/A;  . Lower extremity angiogram Bilateral 06/06/2014    Procedure: Lower Extremity Angiogram;  Surgeon: Sherren Kerns, MD;  Location: [facility] INVASIVE CV LAB;  Service: Cardiovascular;   Laterality: Bilateral;  . Peripheral vascular catheterization Left 06/06/2014    Procedure: Peripheral Vascular Intervention;  Surgeon: Sherren Kerns, MD;  Location: [facility] INVASIVE CV LAB;  Service: Cardiovascular;  Laterality: Left;  pta drug coated x3  . Revison of arteriovenous fistula Left 08/04/2014    Procedure: PLICATION OF  LEFT UPPER ARM ARTERIOVENOUS FISTULA;  Surgeon: Sherren Kerns, MD;  Location: [facility] OR;  Service: Vascular;  Laterality: Left;  . Insertion of dialysis catheter Right 08/04/2014    Procedure: INSERTION OF DIALYSIS CATHETER RIGHT INTERNAL JUGULAR VEIN;  Surgeon: Sherren Kerns, MD;  Location: [facility] OR;  Service: Vascular;  Laterality: Right;  . Insertion of dialysis catheter Right 08/27/2014    Procedure: INSERTION OF DIALYSIS CATHETER RIGHT INTERNAL JUGULAR VEIN;  Surgeon: Pryor Ochoa, MD;  Location: [facility] OR;  Service: Vascular;  Laterality: Right;  . Esophagogastroduodenoscopy (egd) with propofol N/A 08/29/2014    Procedure: ESOPHAGOGASTRODUODENOSCOPY (EGD) WITH PROPOFOL;  Surgeon: Charlott Rakes, MD;  Location: [facility];  Service: Endoscopy;  Laterality: N/A;   Family History  Problem Relation Age of Onset  . Diabetes Mother   . Hypertension Mother   . Diabetes Father   . Hypertension Father    Social History:  reports that he quit smoking about 8 years ago. He has never used smokeless tobacco. He reports that he does not drink alcohol or use illicit drugs. No Known Allergies Prior to Admission medications   Medication Sig Start Date  End Date Taking? Authorizing Provider  atorvastatin (LIPITOR) 40 MG tablet Take 40 mg by mouth at bedtime.   Yes Historical Provider, MD  calcium acetate (PHOSLO) 667 MG capsule Take 1,334-2,001 mg by mouth See admin instructions. Take 3 capsules by mouth 3 times daily and take 1 capsule by mouth with snacks twice daily 03/26/14  Yes Historical Provider, MD  COREG CR 10 MG 24 hr capsule Take 10 mg by mouth daily.  07/17/14  Yes  Historical Provider, MD  gabapentin (NEURONTIN) 100 MG capsule Take 100 mg by mouth daily.  07/17/14  Yes Historical Provider, MD  lisinopril (PRINIVIL,ZESTRIL) 5 MG tablet Take 5 mg by mouth daily.  07/17/14  Yes Historical Provider, MD  NOVOLIN 70/30 (70-30) 100 UNIT/ML injection Inject 10 Units into the skin daily. 09/17/14  Yes Historical Provider, MD  oxyCODONE-acetaminophen (PERCOCET/ROXICET) 5-325 MG per tablet Take 1 tablet by mouth every 4 (four) hours as needed for severe pain. 09/10/14  Yes Fransisco Hertz, MD  amiodarone (PACERONE) 200 MG tablet Take 1 tablet (200 mg total) by mouth daily. Patient not taking: Reported on 10/15/2014 08/30/14   Marinda Elk, MD  clopidogrel (PLAVIX) 75 MG tablet Take 1 tablet (75 mg total) by mouth every other day. 09/08/14   Marinda Elk, MD  docusate sodium (COLACE) 100 MG capsule Take 1 capsule (100 mg total) by mouth 2 (two) times daily. 07/10/13   Reuben Likes, MD  fluconazole (DIFLUCAN) 100 MG tablet Take 1 tablet (100 mg total) by mouth daily. Patient not taking: Reported on 09/10/2014 08/30/14   Marinda Elk, MD  lanthanum (FOSRENOL) 1000 MG chewable tablet Chew 1 tablet (1,000 mg total) by mouth 3 (three) times daily with meals. 08/30/14   Marinda Elk, MD  meropenem 500 mg in sodium chloride 0.9 % 50 mL Inject 500 mg into the vein every other day. 08/31/14   Marinda Elk, MD  Nutritional Supplements (FEEDING SUPPLEMENT, NEPRO CARB STEADY,) LIQD Take 237 mLs by mouth as needed (missed meal during dialysis.). 08/30/14   Marinda Elk, MD  oxyCODONE (OXY IR/ROXICODONE) 5 MG immediate release tablet Take 1 tablet (5 mg total) by mouth every 4 (four) hours as needed for moderate pain. Patient not taking: Reported on 09/10/2014 08/30/14   Marinda Elk, MD  pantoprazole (PROTONIX) 40 MG tablet Take 1 tablet (40 mg total) by mouth 2 (two) times daily. 08/30/14   Marinda Elk, MD  polyethylene glycol [facility])  packet Take 17 g by mouth daily as needed. 08/30/14   Marinda Elk, MD  sorbitol 70 % solution  07/17/14   Historical Provider, MD   Current Facility-Administered Medications  Medication Dose Route Frequency Provider Last Rate Last Dose  . 0.9 %  sodium chloride infusion  250 mL Intravenous PRN Therisa Doyne, MD      . acetaminophen (TYLENOL) tablet 650 mg  650 mg Oral Q6H PRN Therisa Doyne, MD       Or  . acetaminophen (TYLENOL) suppository 650 mg  650 mg Rectal Q6H PRN Therisa Doyne, MD      . antiseptic oral rinse (CPC / CETYLPYRIDINIUM CHLORIDE 0.05%) solution 7 mL  7 mL Mouth Rinse BID Therisa Doyne, MD      . atorvastatin (LIPITOR) tablet 40 mg  40 mg Oral QHS Therisa Doyne, MD   40 mg at 10/15/14 2334  . calcium acetate (PHOSLO) capsule 1,334 mg  1,334 mg Oral BID BM PRN Anastassia  Doutova, MD      . calcium acetate (PHOSLO) capsule 2,001 mg  2,001 mg Oral TID WC Therisa Doyne, MD   2,001 mg at 10/16/14 0910  . carvedilol (COREG CR) 24 hr capsule 10 mg  10 mg Oral Daily Therisa Doyne, MD      . docusate sodium (COLACE) capsule 100 mg  100 mg Oral BID Therisa Doyne, MD   100 mg at 10/15/14 2334  . gabapentin (NEURONTIN) capsule 100 mg  100 mg Oral Daily Therisa Doyne, MD      . heparin injection 5,000 Units  5,000 Units Subcutaneous 3 times per day Ripudeep Jenna Luo, MD      . insulin aspart (novoLOG) injection 0-5 Units  0-5 Units Subcutaneous QHS Ripudeep K Rai, MD      . insulin aspart (novoLOG) injection 0-9 Units  0-9 Units Subcutaneous TID WC Ripudeep K Rai, MD      . lanthanum (FOSRENOL) chewable tablet 1,000 mg  1,000 mg Oral TID WC Therisa Doyne, MD   1,000 mg at 10/16/14 0910  . lisinopril (PRINIVIL,ZESTRIL) tablet 5 mg  5 mg Oral Daily Therisa Doyne, MD      . ondansetron (ZOFRAN) tablet 4 mg  4 mg Oral Q6H PRN Therisa Doyne, MD       Or  . ondansetron (ZOFRAN) injection 4 mg  4 mg Intravenous Q6H PRN Therisa Doyne, MD      . oxyCODONE-acetaminophen (PERCOCET/ROXICET) 5-325 MG per tablet 1 tablet  1 tablet Oral Q4H PRN Therisa Doyne, MD      . pantoprazole (PROTONIX) EC tablet 40 mg  40 mg Oral BID Therisa Doyne, MD   40 mg at 10/15/14 2334  . piperacillin-tazobactam (ZOSYN) IVPB 2.25 g  2.25 g Intravenous 3 times per day Therisa Doyne, MD   2.25 g at 10/16/14 0600  . polyethylene glycol (MIRALAX / GLYCOLAX) packet 17 g  17 g Oral Daily PRN Therisa Doyne, MD      . sodium chloride 0.9 % injection 3 mL  3 mL Intravenous Q12H Therisa Doyne, MD   3 mL at 10/15/14 2300  . sodium chloride 0.9 % injection 3 mL  3 mL Intravenous Q12H Therisa Doyne, MD   3 mL at 10/15/14 2300  . sodium chloride 0.9 % injection 3 mL  3 mL Intravenous PRN Therisa Doyne, MD      . vancomycin (VANCOCIN) 500 mg in sodium chloride 0.9 % 100 mL IVPB  500 mg Intravenous Q T,Th,Sa-HD Therisa Doyne, MD       Labs: Basic Metabolic Panel:  Recent Labs Lab 10/15/14 1427 10/16/14 0520  NA 132* 133*  K 4.4 3.8  CL 91* 91*  CO2 29 27  GLUCOSE 126* 98  BUN 24* 27*  CREATININE 4.67* 5.06*  CALCIUM 8.3* 8.2*  PHOS  --  4.3   Liver Function Tests:  Recent Labs Lab 10/15/14 1427 10/16/14 0520  AST 27 17  ALT 9* 7*  ALKPHOS 132* 122  BILITOT 0.8 1.1  PROT 6.7 6.3*  ALBUMIN 1.9* 1.8*   No results for input(s): LIPASE, AMYLASE in the last 168 hours. No results for input(s): AMMONIA in the last 168 hours. CBC:  Recent Labs Lab 10/15/14 1427 10/16/14 0520  WBC 18.4* 17.5*  NEUTROABS 16.2*  --   HGB 10.0* 9.9*  HCT 30.8* 30.7*  MCV 86.3 86.2  PLT 105* 115*   Cardiac Enzymes:  Recent Labs Lab 10/15/14 1427 10/15/14 2211 10/15/14 2317 10/16/14 0520  TROPONINI 0.35* 0.24* 0.25* 0.22*   CBG:  Recent Labs Lab 10/15/14 2126 10/15/14 2344 10/16/14 0416 10/16/14 0732  GLUCAP 118* 115* 97 91   Iron Studies: No results for input(s): IRON, TIBC, TRANSFERRIN, FERRITIN  in the last 72 hours. Studies/Results: Ct Abdomen Pelvis Wo Contrast  10/15/2014   CLINICAL DATA:  69 year old male with lower abdominal discomfort since yesterday  EXAM: CT ABDOMEN AND PELVIS WITHOUT CONTRAST  TECHNIQUE: Multidetector CT imaging of the abdomen and pelvis was performed following the standard protocol without IV contrast.  COMPARISON:  Prior abdominal radiographs 08/25/2014  FINDINGS: Lower Chest: Dependent atelectasis bilaterally slightly greater on the right than the left secondary to patient positioning. The heart is enlarged. Calcifications are present within the visualized coronary arteries. Partial calcification of the mitral valve annulus and aortic valve also noted. No pericardial effusion.  Abdomen: Unenhanced CT was performed per clinician order. Lack of IV contrast limits sensitivity and specificity, especially for evaluation of abdominal/pelvic solid viscera. Within nose limitations, unremarkable CT appearance of the stomach, duodenum, spleen, adrenal glands and pancreas. Normal hepatic contour and morphology. Atherosclerotic calcifications noted in the hepatic arteries. Numerous stones layer within the gallbladder lumen. The gallbladder is mildly distended at nearly 4.9 cm. No definite gallbladder wall thickening or pericholecystic fluid.  The bilateral native kidneys are atrophic. There are numerous calcifications within the renal arteries. Numerous low-attenuation lesions bilaterally are incompletely characterized in the absence of intravenous contrast. The lesions are low in attenuation suggesting simple cysts.  No focal bowel wall thickening or evidence of obstruction. No suspicious adenopathy. Trace free fluid in the right pericolic gutter.  Pelvis: Marked prostatomegaly. The prostate gland measures 6.9 cm in diameter and is heterogeneous with internal areas of low attenuation. This is incompletely evaluated in the absence of intravenous contrast. The seminal vesicles are also  enlarged and somewhat indistinct suggesting edema. Additionally, there is a small amounts of stranding extending from the seminal vesicles into the perirectal fat. The bladder is decompressed but appears circumferentially thick walled. This may reflect bladder trabeculation in the setting of outlet obstruction related to the prostatomegaly.  Bones/Soft Tissues: Gallbladder is unremarkable. No acute fracture or aggressive appearing lytic or blastic osseous lesion.  Vascular: Extensive calcification of all visualized arteries most consistent with medial arterial sclerosis. No aneurysm.  IMPRESSION: 1. Marked prostatomegaly with rounded areas of internal low-attenuation measuring up to 2.6 cm. In combination with bilateral enlargement of the edematous seminal vesicles and adjacent inflammatory stranding, these findings are concerning for acute prostatitis. The low-attenuation regions may represent areas of focal edema, or potentially abscesses. 2. Trabeculated bladder wall suggests associated bladder outlet obstruction. The bladder is not distended. 3. Trace free fluid in the right pericolic gutter is nonspecific but abnormal. This may be secondary to an underlying infectious/inflammatory process of the small or large bowel which is otherwise occult by imaging. 4. Distended gallbladder containing sludge and small stones. 5. Borderline cardiomegaly. 6. Atrophic native kidneys. 7. Extensive atherosclerotic vascular calcifications consistent with medial arterial sclerosis which is often seen in the setting of longstanding chronic renal disease. These results were called by telephone at the time of interpretation on 10/15/2014 at 7:10 pm to Dr. Juleen China, who verbally acknowledged these results.   Electronically Signed   By: Malachy Moan M.D.   On: 10/15/2014 19:11   Dg Chest 2 View  10/15/2014   CLINICAL DATA:  69 year old male with abdominal pain for 2 days, fatigue. Dialysis yesterday. Initial encounter.  EXAM: CHEST  2 VIEW  COMPARISON:  08/27/2014 and earlier.  FINDINGS: Seated AP and lateral views of the chest. Mild calcified plaque along the right hemidiaphragm appears stable. Stable lung volumes. Stable cardiomegaly and mediastinal contours. Extensive calcified atherosclerosis of the aorta, and including the great vessels on the right. Pulmonary vascularity is increased compared to prior studies. Right IJ approach dialysis catheter is been removed. No pneumothorax or pleural effusion. No consolidation. Osteopenia. No acute osseous abnormality identified.  IMPRESSION: Pulmonary vascular congestion / mild interstitial edema appears increased from prior studies. No definite pleural effusion and otherwise stable chest with cardiomegaly, and calcified aortic atherosclerosis.   Electronically Signed   By: Odessa Fleming M.D.   On: 10/15/2014 15:25    Review of Systems: No complaints Denies fever and chills Denies abdominal pain- says he never had any pain Denies urinary burning/frequency  Physical Exam: Filed Vitals:   10/15/14 1930 10/15/14 2128 10/16/14 0419 10/16/14 0858  BP: 196/84 183/63 156/68 156/59  Pulse: 78 80 81 76  Temp:  98.4 F (36.9 C) 97.6 F (36.4 C) 98.9 F (37.2 C)  TempSrc:    Oral  Resp: 20 18 17 18   Weight:  60.328 kg (133 lb)    SpO2: 98% 100% 100% 97%     General: chronically ill, weak Head: Normocephalic, atraumatic, sclera non-icteric, mucus membranes are moist Neck: Supple. JVD not elevated. Lungs: Clear bilaterally to auscultation without wheezes, rales, or rhonchi. Breathing is unlabored. Heart: RRR with S1 S2. No murmurs, rubs, or gallops appreciated. Abdomen: Soft, non-tender, non-distended with normoactive bowel sounds. No rebound/guarding. No obvious abdominal masses. M-S:  Weak, slumped over in bed, cannot reposition self Lower extremities:without edema or ischemic changes, no open wounds  Neuro: Alert and oriented X 3. Moves all extremities spontaneously. Psych:   Responds to questions appropriately with a normal affect. Dialysis Access:  L AVF +b/t  Dialysis Orders: TTS [facility] 4 hrs   61.5kgs   2K/2Ca    1900 heparin   L AVF venofer 50 q week micera 75 q 2 weeks- last dose given 9/22  Assessment/Plan: 1.  Lower abd pain / acute prostatitis- urology following. Antibiotics started. Plan for OR Monday for transurethral unroofing of abscess.  2.  ESRD -  TTS [facility], HD pending today 3.  Hypertension/volume  - BP elevated. Continue coreg and lisinprol- under edw- lower as toelrated 4.  Anemia  - hgb 9.9  Is due for ESA today.  Cont Fe 5.  Metabolic bone disease -  Continue sensispar and fosrenol. Last phos 4.5 and PTH 1043. No vit D with borderline Ca+/ 10 corrected.  6.  Nutrition - alb 1.8 poor intake, has been losing wt. Vegetarian diet- K+ 3.8 7. DM 8. Dispo- would benefit from palliative care consult again, many comorbidities, noncompliance with HD(missing and short HD) and overall declining health.   Jetty Duhamel, NP [facility] [phone] 10/16/2014, 9:54 AM   Pt seen, examined and agree w A/P as above. ESRD pt with FTT admitted w abd pain and found to prostate abscess.  Urology has seen and now pt admitted for abx and to have a urology procedure on Monday. Chronically ill and doesn't speak much English, nor does his wife.  The sons are making decisions and on the last admission were hopeful for him to get a kidney transplant in [place], this according to the palliative care notes.  Plan HD today, urology procedure on Monday.  Vinson Moselle MD pager [phone]  cell [phone] 10/16/2014, 12:25 PM

## 2014-10-17 LAB — BASIC METABOLIC PANEL
ANION GAP: 19 — AB (ref 5–15)
BUN: 13 mg/dL (ref 6–20)
CO2: 26 mmol/L (ref 22–32)
Calcium: 8.1 mg/dL — ABNORMAL LOW (ref 8.9–10.3)
Chloride: 94 mmol/L — ABNORMAL LOW (ref 101–111)
Creatinine, Ser: 3.27 mg/dL — ABNORMAL HIGH (ref 0.61–1.24)
GFR calc non Af Amer: 18 mL/min — ABNORMAL LOW (ref >60)
GFR, EST AFRICAN AMERICAN: 21 mL/min — AB (ref >60)
GLUCOSE: 105 mg/dL — AB (ref 65–99)
POTASSIUM: 4.4 mmol/L (ref 3.5–5.1)
Sodium: 139 mmol/L (ref 135–145)

## 2014-10-17 LAB — GLUCOSE, CAPILLARY
GLUCOSE-CAPILLARY: 112 mg/dL — AB (ref 65–99)
GLUCOSE-CAPILLARY: 110 mg/dL — AB (ref 65–99)
Glucose-Capillary: 111 mg/dL — ABNORMAL HIGH (ref 65–99)
Glucose-Capillary: 163 mg/dL — ABNORMAL HIGH (ref 65–99)

## 2014-10-17 LAB — CBC
HEMATOCRIT: 33.8 % — AB (ref 39.0–52.0)
HEMOGLOBIN: 10.4 g/dL — AB (ref 13.0–17.0)
MCH: 26.9 pg (ref 26.0–34.0)
MCHC: 30.8 g/dL (ref 30.0–36.0)
MCV: 87.6 fL (ref 78.0–100.0)
Platelets: 142 10*3/uL — ABNORMAL LOW (ref 150–400)
RBC: 3.86 MIL/uL — AB (ref 4.22–5.81)
RDW: 18.3 % — ABNORMAL HIGH (ref 11.5–15.5)
WBC: 17.9 10*3/uL — ABNORMAL HIGH (ref 4.0–10.5)

## 2014-10-17 LAB — HEMOGLOBIN A1C
HEMOGLOBIN A1C: 5.1 % (ref 4.8–5.6)
Mean Plasma Glucose: 100 mg/dL

## 2014-10-17 MED ORDER — SODIUM CHLORIDE 0.9 % IV SOLN
500.0000 mg | Freq: Once | INTRAVENOUS | Status: AC
  Administered 2014-10-17: 500 mg via INTRAVENOUS
  Filled 2014-10-17: qty 0.5

## 2014-10-17 MED ORDER — SODIUM CHLORIDE 0.9 % IV SOLN
500.0000 mg | INTRAVENOUS | Status: DC
  Administered 2014-10-18 – 2014-10-20 (×3): 500 mg via INTRAVENOUS
  Filled 2014-10-17 (×4): qty 0.5

## 2014-10-17 MED ORDER — BOOST / RESOURCE BREEZE PO LIQD
1.0000 | Freq: Three times a day (TID) | ORAL | Status: DC
  Administered 2014-10-17 – 2014-10-21 (×9): 1 via ORAL

## 2014-10-17 NOTE — Progress Notes (Signed)
Pharmacy Antibiotic Time-Out Note  Greg [name] is a 69 y.o. year-old male admitted on 10/15/2014.  The patient is currently on Vancomycin + Zosyn for prostate abscess  Assessment 58 YOM with ESRD who presented on 10/5 with suprapubic abdominal pain for 2 days with imaging that revealed prostatitis and possible prostate abscess. Urology consulted and planning transurethral unroofing of the prostatic abscess on 10/10.   After discussion with Dr. Isidoro Donning, Zosyn will be changed to Meropenem for empiric coverage based on recent cultures from August showing ESBL E.coli that seeded from an HD cath infection - considered at risk for MDR pathogens. Necessity of Vancomycin was also discussed but the MD desires for Vancomycin to be continued at this time. No stop dates are to be entered yet as the patient is awaiting a procedure with urology on 10/10.   Plan 1. Discontinue Zosyn 2. Meropenem 500 mg x 1 dose now followed by 500 mg every 24 hours 3. Continue Vancomycin 500 mg/HD-TTS 4. Will continue to follow HD schedule/duration, culture results, LOT, and antibiotic de-escalation plans    Recent Labs Lab 10/15/14 1427 10/16/14 0520 10/17/14 0553  WBC 18.4* 17.5* 17.9*    Recent Labs Lab 10/15/14 1427 10/16/14 0520 10/17/14 0553  CREATININE 4.67* 5.06* 3.27*   Estimated Creatinine Clearance: 18.7 mL/min (by C-G formula based on Cr of 3.27).   Tmax/24h: Afebrile  Antimicrobial allergies: NKA  Antimicrobials this admission: CTX x 1 10/5 Vanc 10/6 >> Zosyn 10/6 >> 10/7 Mero 10/7 >>  Levels/dose changes this admission: n/a  Microbiology Results: 10/5 BCx >> ngtd 10/5 MRSA PCR >> neg  Thank you for allowing pharmacy to be a part of this patient's care.  Georgina Pillion, PharmD, BCPS Clinical Pharmacist Pager: [phone] 10/17/2014 10:13 AM

## 2014-10-17 NOTE — Consult Note (Signed)
Reason for Consult:surgical clearance for urological procedure/minimally elevated troponin I Referring Physician: [facility] hospitalist  Greg [name] is an 69 y.o. male.  HPI: Patient is 69 year old male with past medical history significant for multiple medical problems i.e. 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  Patient was also noted to have minimally elevated troponin I just trending down.  Due to multiple comorbidities, prior MI and occluded to coronary vessels and minimally elevated troponin I a Cardiologic consultation is called for surgical clearance for unroofing of the prostatic abscess.  Patient presently denies any chest pain, shortness of breath, nausea or vomiting.  Denies any shortness of breath.  Denies any palpitations.  Patient is wheelchair bound.  Past Medical History  Diagnosis Date  . ESRD on hemodialysis ([place])     Started HD around 2006.   Marland Kitchen Cirrhosis ([place])   . Alcohol abuse   . Stroke [facility])     left sided deficits  . Hypertension   . Diabetes mellitus     type 2   . Hepatitis     hep c  . Constipation   . Coronary artery disease   . Hypercholesterolemia     Past Surgical History  Procedure Laterality Date  . Cataract extraction w/ intraocular lens implant    . Av fistula placement Left 2006     upper arm  . Peripheral vascular catheterization N/A 06/06/2014    Procedure: Abdominal Aortogram;  Surgeon: Elam Dutch, MD;  Location: [place] [facility];  Service: Cardiovascular;  Laterality: N/A;  . Lower extremity angiogram Bilateral 06/06/2014    Procedure: Lower Extremity Angiogram;  Surgeon: Elam Dutch, MD;  Location: [place] [facility];  Service: Cardiovascular;  Laterality: Bilateral;  . Peripheral vascular catheterization Left 06/06/2014    Procedure: Peripheral Vascular Intervention;  Surgeon: Elam Dutch, MD;  Location: [place] [facility];  Service: Cardiovascular;  Laterality: Left;  pta drug coated x3  . Revison of arteriovenous fistula Left 0/08/6759    Procedure: PLICATION OF  LEFT UPPER ARM ARTERIOVENOUS FISTULA;  Surgeon: Elam Dutch, MD;  Location: [place];  Service: Vascular;  Laterality: Left;  . Insertion of dialysis catheter Right 08/04/2014    Procedure: INSERTION OF DIALYSIS CATHETER RIGHT INTERNAL JUGULAR VEIN;  Surgeon: Elam Dutch, MD;  Location: [place];  Service: Vascular;  Laterality: Right;  . Insertion of dialysis catheter Right 08/27/2014    Procedure: INSERTION OF DIALYSIS CATHETER RIGHT INTERNAL JUGULAR VEIN;  Surgeon: Mal Misty, MD;  Location: [place];  Service: Vascular;  Laterality: Right;  . Esophagogastroduodenoscopy (egd) with propofol N/A 08/29/2014    Procedure: ESOPHAGOGASTRODUODENOSCOPY (EGD) WITH PROPOFOL;  Surgeon: Wilford Corner, MD;  Location: [facility];  Service: Endoscopy;  Laterality: N/A;    Family History  Problem Relation Age of Onset  . Diabetes Mother   . Hypertension Mother   . Diabetes Father   .  Hypertension Father     Social History:  reports that he quit smoking about 8 years ago. He has never used smokeless tobacco. He reports that he does not drink alcohol or use illicit drugs.  Allergies: No Known Allergies  Medications: I have reviewed the patient's current medications.  Results for orders  placed or performed during the hospital encounter of 10/15/14 (from the past 48 hour(s))  Blood culture (routine x 2)     Status: None (Preliminary result)   Collection Time: 10/15/14  7:40 PM  Result Value Ref Range   Specimen Description BLOOD RIGHT ARM    Special Requests BOTTLES DRAWN AEROBIC AND ANAEROBIC 5CC    Culture NO GROWTH 2 DAYS    Report Status PENDING   Blood culture (routine x 2)     Status: None (Preliminary result)   Collection Time: 10/15/14  7:52 PM  Result Value Ref Range   Specimen Description BLOOD RIGHT HAND    Special Requests BOTTLES DRAWN AEROBIC AND ANAEROBIC 5CC    Culture NO GROWTH 2 DAYS    Report Status PENDING   Glucose, capillary     Status: Abnormal   Collection Time: 10/15/14  9:26 PM  Result Value Ref Range   Glucose-Capillary 118 (H) 65 - 99 mg/dL  MRSA PCR Screening     Status: None   Collection Time: 10/15/14  9:49 PM  Result Value Ref Range   MRSA by PCR NEGATIVE NEGATIVE    Comment:        The GeneXpert MRSA Assay (FDA approved for NASAL specimens only), is one component of a comprehensive MRSA colonization surveillance program. It is not intended to diagnose MRSA infection nor to guide or monitor treatment for MRSA infections.   Troponin I     Status: Abnormal   Collection Time: 10/15/14 10:11 PM  Result Value Ref Range   Troponin I 0.24 (H) <0.031 ng/mL    Comment:        PERSISTENTLY INCREASED TROPONIN VALUES IN THE RANGE OF 0.04-0.49 ng/mL CAN BE SEEN IN:       -UNSTABLE ANGINA       -CONGESTIVE HEART FAILURE       -MYOCARDITIS       -CHEST TRAUMA       -ARRYHTHMIAS       -LATE PRESENTING MYOCARDIAL INFARCTION       -COPD   CLINICAL FOLLOW-UP RECOMMENDED.   Troponin I     Status: Abnormal   Collection Time: 10/15/14 11:17 PM  Result Value Ref Range   Troponin I 0.25 (H) <0.031 ng/mL    Comment:        PERSISTENTLY INCREASED TROPONIN VALUES IN THE RANGE OF 0.04-0.49 ng/mL CAN BE SEEN IN:       -UNSTABLE ANGINA        -CONGESTIVE HEART FAILURE       -MYOCARDITIS       -CHEST TRAUMA       -ARRYHTHMIAS       -LATE PRESENTING MYOCARDIAL INFARCTION       -COPD   CLINICAL FOLLOW-UP RECOMMENDED.   Glucose, capillary     Status: Abnormal   Collection Time: 10/15/14 11:44 PM  Result Value Ref Range   Glucose-Capillary 115 (H) 65 - 99 mg/dL  Glucose, capillary     Status: None   Collection Time: 10/16/14  4:16 AM  Result Value Ref Range   Glucose-Capillary 97 65 - 99 mg/dL  Hemoglobin A1c     Status:  None   Collection Time: 10/16/14  5:20 AM  Result Value Ref Range   Hgb A1c MFr Bld 5.1 4.8 - 5.6 %    Comment: (NOTE)         Pre-diabetes: 5.7 - 6.4         Diabetes: >6.4         Glycemic control for adults with diabetes: <7.0    Mean Plasma Glucose 100 mg/dL    Comment: (NOTE) Performed At: [facility] [place], [place] [id number] Lindon Romp MD [id number]   Magnesium     Status: None   Collection Time: 10/16/14  5:20 AM  Result Value Ref Range   Magnesium 2.2 1.7 - 2.4 mg/dL  Phosphorus     Status: None   Collection Time: 10/16/14  5:20 AM  Result Value Ref Range   Phosphorus 4.3 2.5 - 4.6 mg/dL  TSH     Status: None   Collection Time: 10/16/14  5:20 AM  Result Value Ref Range   TSH 0.951 0.350 - 4.500 uIU/mL  Comprehensive metabolic panel     Status: Abnormal   Collection Time: 10/16/14  5:20 AM  Result Value Ref Range   Sodium 133 (L) 135 - 145 mmol/L   Potassium 3.8 3.5 - 5.1 mmol/L   Chloride 91 (L) 101 - 111 mmol/L   CO2 27 22 - 32 mmol/L   Glucose, Bld 98 65 - 99 mg/dL   BUN 27 (H) 6 - 20 mg/dL   Creatinine, Ser 5.06 (H) 0.61 - 1.24 mg/dL   Calcium 8.2 (L) 8.9 - 10.3 mg/dL   Total Protein 6.3 (L) 6.5 - 8.1 g/dL   Albumin 1.8 (L) 3.5 - 5.0 g/dL   AST 17 15 - 41 U/L   ALT 7 (L) 17 - 63 U/L   Alkaline Phosphatase 122 38 - 126 U/L   Total Bilirubin 1.1 0.3 - 1.2 mg/dL   GFR calc non Af Amer 11 (L) >60 mL/min   GFR calc Af Amer 12 (L) >60  mL/min    Comment: (NOTE) The eGFR has been calculated using the CKD EPI equation. This calculation has not been validated in all clinical situations. eGFR's persistently <60 mL/min signify possible Chronic Kidney Disease.    Anion gap 15 5 - 15  CBC     Status: Abnormal   Collection Time: 10/16/14  5:20 AM  Result Value Ref Range   WBC 17.5 (H) 4.0 - 10.5 K/uL    Comment: REPEATED TO VERIFY CONSISTENT WITH PREVIOUS RESULT    RBC 3.56 (L) 4.22 - 5.81 MIL/uL   Hemoglobin 9.9 (L) 13.0 - 17.0 g/dL    Comment: REPEATED TO VERIFY CONSISTENT WITH PREVIOUS RESULT    HCT 30.7 (L) 39.0 - 52.0 %   MCV 86.2 78.0 - 100.0 fL   MCH 27.8 26.0 - 34.0 pg   MCHC 32.2 30.0 - 36.0 g/dL   RDW 18.3 (H) 11.5 - 15.5 %   Platelets 115 (L) 150 - 400 K/uL    Comment: SPECIMEN CHECKED FOR CLOTS REPEATED TO VERIFY CONSISTENT WITH PREVIOUS RESULT   Troponin I     Status: Abnormal   Collection Time: 10/16/14  5:20 AM  Result Value Ref Range   Troponin I 0.22 (H) <0.031 ng/mL    Comment:        PERSISTENTLY INCREASED TROPONIN VALUES IN THE RANGE OF 0.04-0.49 ng/mL CAN BE SEEN IN:       -UNSTABLE ANGINA       -  CONGESTIVE HEART FAILURE       -MYOCARDITIS       -CHEST TRAUMA       -ARRYHTHMIAS       -LATE PRESENTING MYOCARDIAL INFARCTION       -COPD   CLINICAL FOLLOW-UP RECOMMENDED.   Lactic acid, plasma     Status: None   Collection Time: 10/16/14  5:20 AM  Result Value Ref Range   Lactic Acid, Venous 1.3 0.5 - 2.0 mmol/L  Glucose, capillary     Status: None   Collection Time: 10/16/14  7:32 AM  Result Value Ref Range   Glucose-Capillary 91 65 - 99 mg/dL  Troponin I     Status: Abnormal   Collection Time: 10/16/14 10:20 AM  Result Value Ref Range   Troponin I 0.19 (H) <0.031 ng/mL    Comment:        PERSISTENTLY INCREASED TROPONIN VALUES IN THE RANGE OF 0.04-0.49 ng/mL CAN BE SEEN IN:       -UNSTABLE ANGINA       -CONGESTIVE HEART FAILURE       -MYOCARDITIS       -CHEST TRAUMA        -ARRYHTHMIAS       -LATE PRESENTING MYOCARDIAL INFARCTION       -COPD   CLINICAL FOLLOW-UP RECOMMENDED.   Glucose, capillary     Status: None   Collection Time: 10/16/14 11:16 AM  Result Value Ref Range   Glucose-Capillary 97 65 - 99 mg/dL  Glucose, capillary     Status: None   Collection Time: 10/16/14  6:09 PM  Result Value Ref Range   Glucose-Capillary 80 65 - 99 mg/dL  Glucose, capillary     Status: None   Collection Time: 10/16/14  8:16 PM  Result Value Ref Range   Glucose-Capillary 94 65 - 99 mg/dL  Basic metabolic panel     Status: Abnormal   Collection Time: 10/17/14  5:53 AM  Result Value Ref Range   Sodium 139 135 - 145 mmol/L   Potassium 4.4 3.5 - 5.1 mmol/L   Chloride 94 (L) 101 - 111 mmol/L   CO2 26 22 - 32 mmol/L   Glucose, Bld 105 (H) 65 - 99 mg/dL   BUN 13 6 - 20 mg/dL   Creatinine, Ser 3.27 (H) 0.61 - 1.24 mg/dL    Comment: DELTA CHECK NOTED   Calcium 8.1 (L) 8.9 - 10.3 mg/dL   GFR calc non Af Amer 18 (L) >60 mL/min   GFR calc Af Amer 21 (L) >60 mL/min    Comment: (NOTE) The eGFR has been calculated using the CKD EPI equation. This calculation has not been validated in all clinical situations. eGFR's persistently <60 mL/min signify possible Chronic Kidney Disease.    Anion gap 19 (H) 5 - 15  CBC     Status: Abnormal   Collection Time: 10/17/14  5:53 AM  Result Value Ref Range   WBC 17.9 (H) 4.0 - 10.5 K/uL   RBC 3.86 (L) 4.22 - 5.81 MIL/uL   Hemoglobin 10.4 (L) 13.0 - 17.0 g/dL   HCT 33.8 (L) 39.0 - 52.0 %   MCV 87.6 78.0 - 100.0 fL   MCH 26.9 26.0 - 34.0 pg   MCHC 30.8 30.0 - 36.0 g/dL   RDW 18.3 (H) 11.5 - 15.5 %   Platelets 142 (L) 150 - 400 K/uL  Glucose, capillary     Status: Abnormal   Collection Time: 10/17/14  7:28 AM  Result  Value Ref Range   Glucose-Capillary 112 (H) 65 - 99 mg/dL  Glucose, capillary     Status: Abnormal   Collection Time: 10/17/14 11:11 AM  Result Value Ref Range   Glucose-Capillary 110 (H) 65 - 99 mg/dL  Glucose,  capillary     Status: Abnormal   Collection Time: 10/17/14  4:15 PM  Result Value Ref Range   Glucose-Capillary 111 (H) 65 - 99 mg/dL    Ct Abdomen Pelvis Wo Contrast  10/15/2014   CLINICAL DATA:  69 year old male with lower abdominal discomfort since yesterday  EXAM: CT ABDOMEN AND PELVIS WITHOUT CONTRAST  TECHNIQUE: Multidetector CT imaging of the abdomen and pelvis was performed following the standard protocol without IV contrast.  COMPARISON:  Prior abdominal radiographs 08/25/2014  FINDINGS: Lower Chest: Dependent atelectasis bilaterally slightly greater on the right than the left secondary to patient positioning. The heart is enlarged. Calcifications are present within the visualized coronary arteries. Partial calcification of the mitral valve annulus and aortic valve also noted. No pericardial effusion.  Abdomen: Unenhanced CT was performed per clinician order. Lack of IV contrast limits sensitivity and specificity, especially for evaluation of abdominal/pelvic solid viscera. Within nose limitations, unremarkable CT appearance of the stomach, duodenum, spleen, adrenal glands and pancreas. Normal hepatic contour and morphology. Atherosclerotic calcifications noted in the hepatic arteries. Numerous stones layer within the gallbladder lumen. The gallbladder is mildly distended at nearly 4.9 cm. No definite gallbladder wall thickening or pericholecystic fluid.  The bilateral native kidneys are atrophic. There are numerous calcifications within the renal arteries. Numerous low-attenuation lesions bilaterally are incompletely characterized in the absence of intravenous contrast. The lesions are low in attenuation suggesting simple cysts.  No focal bowel wall thickening or evidence of obstruction. No suspicious adenopathy. Trace free fluid in the right pericolic gutter.  Pelvis: Marked prostatomegaly. The prostate gland measures 6.9 cm in diameter and is heterogeneous with internal areas of low attenuation.  This is incompletely evaluated in the absence of intravenous contrast. The seminal vesicles are also enlarged and somewhat indistinct suggesting edema. Additionally, there is a small amounts of stranding extending from the seminal vesicles into the perirectal fat. The bladder is decompressed but appears circumferentially thick walled. This may reflect bladder trabeculation in the setting of outlet obstruction related to the prostatomegaly.  Bones/Soft Tissues: Gallbladder is unremarkable. No acute fracture or aggressive appearing lytic or blastic osseous lesion.  Vascular: Extensive calcification of all visualized arteries most consistent with medial arterial sclerosis. No aneurysm.  IMPRESSION: 1. Marked prostatomegaly with rounded areas of internal low-attenuation measuring up to 2.6 cm. In combination with bilateral enlargement of the edematous seminal vesicles and adjacent inflammatory stranding, these findings are concerning for acute prostatitis. The low-attenuation regions may represent areas of focal edema, or potentially abscesses. 2. Trabeculated bladder wall suggests associated bladder outlet obstruction. The bladder is not distended. 3. Trace free fluid in the right pericolic gutter is nonspecific but abnormal. This may be secondary to an underlying infectious/inflammatory process of the small or large bowel which is otherwise occult by imaging. 4. Distended gallbladder containing sludge and small stones. 5. Borderline cardiomegaly. 6. Atrophic native kidneys. 7. Extensive atherosclerotic vascular calcifications consistent with medial arterial sclerosis which is often seen in the setting of longstanding chronic renal disease. These results were called by telephone at the time of interpretation on 10/15/2014 at 7:10 pm to Dr. Wilson Singer, who verbally acknowledged these results.   Electronically Signed   By: Jacqulynn Cadet M.D.   On: 10/15/2014  19:11    Review of Systems  Respiratory: Negative for cough,  hemoptysis and sputum production.   Cardiovascular: Negative for chest pain and palpitations.  Gastrointestinal: Positive for abdominal pain.  Neurological: Negative for dizziness.   Blood pressure 156/62, pulse 88, temperature 98 F (36.7 C), temperature source Oral, resp. rate 17, weight 60.4 kg (133 lb 2.5 oz), SpO2 100 %. Physical Exam  Constitutional: He is oriented to person, place, and time.  HENT:  Head: Normocephalic and atraumatic.  Eyes: Conjunctivae are normal. Left eye exhibits no discharge.  Neck: Normal range of motion. Neck supple. No tracheal deviation present. No thyromegaly present.  Cardiovascular: Normal rate and regular rhythm.  Gallop: soft systolic murmur noted.  No pericardial rub.   Murmur heard. Respiratory: Effort normal and breath sounds normal. No respiratory distress.  GI: Soft.  Musculoskeletal: He exhibits no edema or tenderness.  Neurological: He is alert and oriented to person, place, and time.    Assessment/Plan: Probable very small non-Q-wave myocardial infarction. 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  2 coronary vessels, i.e., RCA and left circumflex in the past. Prostatic abscess. Hypertension. Diabetes mellitus. 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 Hyperlipidemia. Anemia of chronic disease. Plan Agree with present management. We'll schedule him for nuclear stress test in a.m. To further risk stratify for above  surgery.   Charolette Forward 10/17/2014, 5:14 PM

## 2014-10-17 NOTE — Progress Notes (Signed)
[facility]                                                                              Patient Demographics  Greg [name], is a 69 y.o. male, DOB - May 06, 1945, ZOX:[id number]  Admit date - 10/15/2014   Admitting Physician Therisa Doyne, MD  Outpatient Primary MD for the patient is Rinaldo Cloud, MD  LOS - 2   Chief Complaint  Patient presents with  . Abdominal Pain       Brief HPI   Greg [name] is a 69 y.o. Male with ESRD on hemodialysis TTS, Cirrhosis, Alcohol abuse; Stroke,  Hypertension; Diabetes mellitus; Hepatitis; Constipation; Coronary artery disease; and Hypercholesterolemia. Patient had been having suprapubic abdominal pain for past 2 days, associated with nausea, vomiting but no diarrhea.  Patient still makes occasional urine, denied any fevers or chills, chest pain or shortness of breath. CT abdomen and pelvis showed marked prostatomegaly with rounded area of internal low-attenuation measuring up to 2.6 cm concerning for acute prostatitis potentially Abscess. Urology was consulted in the ED and recommended admission. Also noted was elevated troponin of 0.35 but no chest pain. Patient denied any syncope or palpitations. Of note patient was admitted from 8/13-8/21 for Escherichia coli sepsis with source being dialysis catheter. Since then his dialysis catheter has been removed. Patient was discharged home on meropenem for 2 weeks which has been completed. Patient has been dialyzing with his fistula. He has hx of chronic diabetic foot with dry gangrene followed by Vascular surgery Reports no wound care clinic. But states that Dr. Lorenza Evangelist sees him once a weak.   Assessment & Plan    Principal Problem:   Prostatitis, acute - CT abdomen showed marked prostatomegaly with bilateral enlargement of the mediastinal vesicles and the adjacent inflammatory stranding, potentially abscesses or focal edema associated bladder outlet obstruction - Urology was  consulted, Dr. Lenn Sink continuing broad-spectrum antibiotics for a few days and OR for transurethral unroofing of prostatic abscess on 10/10 - Continue IV vancomycin and imipenem - Per urology's request, I have called Dr Lorenza Evangelist (patient's cardiologist) for cardiac clearance prior to the surgery.  - blood cultures negative so far, unfortunately no urine culture was ever sent from the ER  Active Problems:   Coronary atherosclerosis with elevated troponin; currently no chest pain or shortness of breath - Per patient, he is followed by Dr. Lorenza Evangelist, I have requested for consult. Troponins trending down.  - Recent 2-D echo 8/16 showed EF of 50% with mild LVH - Continue statin, ACE inhibitor, beta blocker, Plavix currently on hold for surgery  ESRD on hemodialysis TTS - Nephrology consult placed, renal following    Essential hypertension - continue coreg, lisinopril      CVA (cerebral vascular accident)  - Continue statin, Plavix currently on hold for surgery on Monday    Type 2 diabetes mellitus with complication, nephropathy  - HbA1c 5.1 , on sliding scale insulin    Hyperlipidemia - continue statin    Protein-calorie malnutrition, severe -Placed on nutritional supplements, nutrition consult   Code Status: full code   Family Communication: Discussed in detail with the patient, all imaging  results, lab results explained to the patient and patient's son at the bedside. I have also explained to the patient about the management in his native language, Urdu which I understand and can speak.   Disposition Plan: Not medically ready   Time Spent in minutes  25 mins   Procedures  CT abdomen   Consults   Nephrology   urology  DVT Prophylaxis   heparin  Medications  Scheduled Meds: . antiseptic oral rinse  7 mL Mouth Rinse BID  . atorvastatin  40 mg Oral QHS  . carvedilol  10 mg Oral Daily  . cinacalcet  180 mg Oral Q supper  . darbepoetin (ARANESP) injection - DIALYSIS  60  mcg Intravenous Q Thu-HD  . docusate sodium  100 mg Oral BID  . feeding supplement  1 Container Oral TID BM  . ferric gluconate (FERRLECIT/NULECIT) IV  62.5 mg Intravenous Weekly  . gabapentin  100 mg Oral Daily  . heparin subcutaneous  5,000 Units Subcutaneous 3 times per day  . insulin aspart  0-5 Units Subcutaneous QHS  . insulin aspart  0-9 Units Subcutaneous TID WC  . lanthanum  1,000 mg Oral TID WC  . lisinopril  5 mg Oral Daily  . meropenem (MERREM) IV  500 mg Intravenous Once  . [START ON 10/18/2014] meropenem (MERREM) IV  500 mg Intravenous Q24H  . multivitamin  1 tablet Oral QHS  . pantoprazole  40 mg Oral BID  . sodium chloride  3 mL Intravenous Q12H  . sodium chloride  3 mL Intravenous Q12H  . vancomycin  500 mg Intravenous Q T,Th,Sa-HD   Continuous Infusions:  PRN Meds:.sodium chloride, acetaminophen **OR** acetaminophen, ondansetron **OR** ondansetron (ZOFRAN) IV, oxyCODONE-acetaminophen, polyethylene glycol, sodium chloride   Antibiotics   Anti-infectives    Start     Dose/Rate Route Frequency Ordered Stop   10/18/14 2000  meropenem (MERREM) 500 mg in sodium chloride 0.9 % 50 mL IVPB     500 mg 100 mL/hr over 30 Minutes Intravenous Every 24 hours 10/17/14 1008     10/17/14 1100  meropenem (MERREM) 500 mg in sodium chloride 0.9 % 50 mL IVPB     500 mg 100 mL/hr over 30 Minutes Intravenous  Once 10/17/14 1008     10/16/14 1900  piperacillin-tazobactam (ZOSYN) IVPB 2.25 g  Status:  Discontinued     2.25 g 100 mL/hr over 30 Minutes Intravenous Every 8 hours 10/16/14 1832 10/17/14 0924   10/16/14 1200  vancomycin (VANCOCIN) 500 mg in sodium chloride 0.9 % 100 mL IVPB     500 mg 100 mL/hr over 60 Minutes Intravenous Every T-Th-Sa (Hemodialysis) 10/15/14 2338     10/15/14 2359  vancomycin (VANCOCIN) 1,250 mg in sodium chloride 0.9 % 250 mL IVPB     1,250 mg 166.7 mL/hr over 90 Minutes Intravenous  Once 10/15/14 2338 10/16/14 0225   10/15/14 2359   piperacillin-tazobactam (ZOSYN) IVPB 2.25 g  Status:  Discontinued     2.25 g 100 mL/hr over 30 Minutes Intravenous 3 times per day 10/15/14 2338 10/16/14 1832   10/15/14 1915  cefTRIAXone (ROCEPHIN) 1 g in dextrose 5 % 50 mL IVPB     1 g 100 mL/hr over 30 Minutes Intravenous  Once 10/15/14 1915 10/15/14 2037        Subjective:   Wendy Mikles was seen and examined today.  Having suprapubic pain, no fevers or chills, no nausea or vomiting. No chest pain. Patient denies dizziness, shortness of breath,  new weakness, numbess, tingling. No acute events overnight.    Objective:   Blood pressure 168/62, pulse 80, temperature 98.4 F (36.9 C), temperature source Oral, resp. rate 17, weight 60.4 kg (133 lb 2.5 oz), SpO2 100 %.  Wt Readings from Last 3 Encounters:  10/16/14 60.4 kg (133 lb 2.5 oz)  09/10/14 68.04 kg (150 lb)  08/31/14 63 kg (138 lb 14.2 oz)     Intake/Output Summary (Last 24 hours) at 10/17/14 1033 Last data filed at 10/17/14 0819  Gross per 24 hour  Intake    250 ml  Output   1900 ml  Net  -1650 ml    Exam  General: Alert and oriented x 3, NAD  HEENT:  PERRLA, EOMI, Anicteric Sclera, mucous membranes moist.   Neck: Supple, no JVD, no masses  CVS: S1 S2 clear, RRR  Respiratory: CTAB  Abdomen: Soft, lower abdominal tenderness to deep palpation, normal bowel sounds  Ext: no cyanosis clubbing or edema  Neuro: no new deficits  Skin: No rashes  Psych: Normal affect and demeanor, alert and oriented x3    Data Review   Micro Results Recent Results (from the past 240 hour(s))  Blood culture (routine x 2)     Status: None (Preliminary result)   Collection Time: 10/15/14  7:40 PM  Result Value Ref Range Status   Specimen Description BLOOD RIGHT ARM  Final   Special Requests BOTTLES DRAWN AEROBIC AND ANAEROBIC 5CC  Final   Culture NO GROWTH 2 DAYS  Final   Report Status PENDING  Incomplete  Blood culture (routine x 2)     Status: None (Preliminary  result)   Collection Time: 10/15/14  7:52 PM  Result Value Ref Range Status   Specimen Description BLOOD RIGHT HAND  Final   Special Requests BOTTLES DRAWN AEROBIC AND ANAEROBIC 5CC  Final   Culture NO GROWTH 2 DAYS  Final   Report Status PENDING  Incomplete  MRSA PCR Screening     Status: None   Collection Time: 10/15/14  9:49 PM  Result Value Ref Range Status   MRSA by PCR NEGATIVE NEGATIVE Final    Comment:        The GeneXpert MRSA Assay (FDA approved for NASAL specimens only), is one component of a comprehensive MRSA colonization surveillance program. It is not intended to diagnose MRSA infection nor to guide or monitor treatment for MRSA infections.     Radiology Reports Ct Abdomen Pelvis Wo Contrast  10/15/2014   CLINICAL DATA:  69 year old male with lower abdominal discomfort since yesterday  EXAM: CT ABDOMEN AND PELVIS WITHOUT CONTRAST  TECHNIQUE: Multidetector CT imaging of the abdomen and pelvis was performed following the standard protocol without IV contrast.  COMPARISON:  Prior abdominal radiographs 08/25/2014  FINDINGS: Lower Chest: Dependent atelectasis bilaterally slightly greater on the right than the left secondary to patient positioning. The heart is enlarged. Calcifications are present within the visualized coronary arteries. Partial calcification of the mitral valve annulus and aortic valve also noted. No pericardial effusion.  Abdomen: Unenhanced CT was performed per clinician order. Lack of IV contrast limits sensitivity and specificity, especially for evaluation of abdominal/pelvic solid viscera. Within nose limitations, unremarkable CT appearance of the stomach, duodenum, spleen, adrenal glands and pancreas. Normal hepatic contour and morphology. Atherosclerotic calcifications noted in the hepatic arteries. Numerous stones layer within the gallbladder lumen. The gallbladder is mildly distended at nearly 4.9 cm. No definite gallbladder wall thickening or  pericholecystic fluid.  The bilateral native  kidneys are atrophic. There are numerous calcifications within the renal arteries. Numerous low-attenuation lesions bilaterally are incompletely characterized in the absence of intravenous contrast. The lesions are low in attenuation suggesting simple cysts.  No focal bowel wall thickening or evidence of obstruction. No suspicious adenopathy. Trace free fluid in the right pericolic gutter.  Pelvis: Marked prostatomegaly. The prostate gland measures 6.9 cm in diameter and is heterogeneous with internal areas of low attenuation. This is incompletely evaluated in the absence of intravenous contrast. The seminal vesicles are also enlarged and somewhat indistinct suggesting edema. Additionally, there is a small amounts of stranding extending from the seminal vesicles into the perirectal fat. The bladder is decompressed but appears circumferentially thick walled. This may reflect bladder trabeculation in the setting of outlet obstruction related to the prostatomegaly.  Bones/Soft Tissues: Gallbladder is unremarkable. No acute fracture or aggressive appearing lytic or blastic osseous lesion.  Vascular: Extensive calcification of all visualized arteries most consistent with medial arterial sclerosis. No aneurysm.  IMPRESSION: 1. Marked prostatomegaly with rounded areas of internal low-attenuation measuring up to 2.6 cm. In combination with bilateral enlargement of the edematous seminal vesicles and adjacent inflammatory stranding, these findings are concerning for acute prostatitis. The low-attenuation regions may represent areas of focal edema, or potentially abscesses. 2. Trabeculated bladder wall suggests associated bladder outlet obstruction. The bladder is not distended. 3. Trace free fluid in the right pericolic gutter is nonspecific but abnormal. This may be secondary to an underlying infectious/inflammatory process of the small or large bowel which is otherwise occult by  imaging. 4. Distended gallbladder containing sludge and small stones. 5. Borderline cardiomegaly. 6. Atrophic native kidneys. 7. Extensive atherosclerotic vascular calcifications consistent with medial arterial sclerosis which is often seen in the setting of longstanding chronic renal disease. These results were called by telephone at the time of interpretation on 10/15/2014 at 7:10 pm to Dr. Juleen China, who verbally acknowledged these results.   Electronically Signed   By: Malachy Moan M.D.   On: 10/15/2014 19:11   Dg Chest 2 View  10/15/2014   CLINICAL DATA:  69 year old male with abdominal pain for 2 days, fatigue. Dialysis yesterday. Initial encounter.  EXAM: CHEST  2 VIEW  COMPARISON:  08/27/2014 and earlier.  FINDINGS: Seated AP and lateral views of the chest. Mild calcified plaque along the right hemidiaphragm appears stable. Stable lung volumes. Stable cardiomegaly and mediastinal contours. Extensive calcified atherosclerosis of the aorta, and including the great vessels on the right. Pulmonary vascularity is increased compared to prior studies. Right IJ approach dialysis catheter is been removed. No pneumothorax or pleural effusion. No consolidation. Osteopenia. No acute osseous abnormality identified.  IMPRESSION: Pulmonary vascular congestion / mild interstitial edema appears increased from prior studies. No definite pleural effusion and otherwise stable chest with cardiomegaly, and calcified aortic atherosclerosis.   Electronically Signed   By: Odessa Fleming M.D.   On: 10/15/2014 15:25    CBC  Recent Labs Lab 10/15/14 1427 10/16/14 0520 10/17/14 0553  WBC 18.4* 17.5* 17.9*  HGB 10.0* 9.9* 10.4*  HCT 30.8* 30.7* 33.8*  PLT 105* 115* 142*  MCV 86.3 86.2 87.6  MCH 28.0 27.8 26.9  MCHC 32.5 32.2 30.8  RDW 18.4* 18.3* 18.3*  LYMPHSABS 1.0  --   --   MONOABS 1.1*  --   --   EOSABS 0.0  --   --   BASOSABS 0.0  --   --     Chemistries   Recent Labs Lab 10/15/14 1427 10/16/14 0520  10/17/14 0553  NA 132* 133* 139  K 4.4 3.8 4.4  CL 91* 91* 94*  CO2 GLUCOSE 126* 98 105*  BUN 24* 27* 13  CREATININE 4.67* 5.06* 3.27*  CALCIUM 8.3* 8.2* 8.1*  MG  --  2.2  --   AST 27 17  --   ALT 9* 7*  --   ALKPHOS 132* 122  --   BILITOT 0.8 1.1  --    ------------------------------------------------------------------------------------------------------------------ estimated creatinine clearance is 18.7 mL/min (by C-G formula based on Cr of 3.27). ------------------------------------------------------------------------------------------------------------------  Recent Labs  10/16/14 0520  HGBA1C 5.1   ------------------------------------------------------------------------------------------------------------------ No results for input(s): CHOL, HDL, LDLCALC, TRIG, CHOLHDL, LDLDIRECT in the last 72 hours. ------------------------------------------------------------------------------------------------------------------  Recent Labs  10/16/14 0520  TSH 0.951   ------------------------------------------------------------------------------------------------------------------ No results for input(s): VITAMINB12, FOLATE, FERRITIN, TIBC, IRON, RETICCTPCT in the last 72 hours.  Coagulation profile No results for input(s): INR, PROTIME in the last 168 hours.  No results for input(s): DDIMER in the last 72 hours.  Cardiac Enzymes  Recent Labs Lab 10/15/14 2317 10/16/14 0520 10/16/14 1020  TROPONINI 0.25* 0.22* 0.19*   ------------------------------------------------------------------------------------------------------------------ Invalid input(s): POCBNP   Recent Labs  10/16/14 0416 10/16/14 0732 10/16/14 1116 10/16/14 1809 10/16/14 2016 10/17/14 0728  GLUCAP 97 91 97 80 94 112*     Lucila Klecka M.D. [facility] 10/17/2014, 10:33 AM  Pager: [phone] Between 7am to 7pm - call Pager - [phone]  After 7pm go to [email or web address] -  password [email or web address]  Call night coverage person covering after 7pm

## 2014-10-17 NOTE — Progress Notes (Signed)
Subjective:  Co abd pain /tolerated  HD yesterday,but he is not aware he had hd and this am thinks he is in Dr. Alferd Patee office Objective Vital signs in last 24 hours: Filed Vitals:   10/16/14 2018 10/17/14 0036 10/17/14 0423 10/17/14 0818  BP: 138/68 159/60 153/63 168/62  Pulse: 75 82 79 80  Temp: 97.7 F (36.5 C) 98.4 F (36.9 C) 98.5 F (36.9 C) 98.4 F (36.9 C)  TempSrc: Oral Oral Oral Oral  Resp: Weight: 60.4 kg (133 lb 2.5 oz)     SpO2: 100% 100% 100% 100%   Weight change: -0.128 kg (-4.5 oz)  Physical Exam: General: pleasantly confused chronically ill elderly male NAD Lungs: Clear bilat.  Breathing is unlabored. Heart: RRR / No murmurs, rubs, or gallops appreciated. Abdomen: Soft BS Pos, Tender throughout. Nondistended  Extremities: no pedal  edema  Dialysis Access: L AVF +b/t  Dialysis Orders: TTS [facility] 4 hrs 61.5kgs 2K/2Ca 1900 heparin L AVF venofer 50 q week micera 75 q 2 weeks- last dose given 9/22  Problem/Plan: 1. Lower abd pain / acute prostatitis with Abscess - urology following. Antibiotics started. Plan for OR Monday for transurethral unroofing of abscess, after Cardiology to clears before surg. 2. ESRD - TTS [facility] on schedule  3. Hypertension/volume -tolerated 1900 ccu f yest  Continue coreg and lisinprol- under edw- lower as toelrated 4. Anemia - hgb 9.9>10.4  ESA  Yesterday . Cont Fe 5. Metabolic bone disease - Continue sensispar and fosrenol.  phos 4.3 and PTH 1043. No vit D with borderline Ca+/ 10 corrected.  6. Coronary atherosclerosis with elevated troponin- no chest pain or shortness of breath/ Cardiology to clear before Urology surg. 7. Nutrition - alb 1.8 poor intake, has been losing wt. Vegetarian diet- K+ 3.8 admit >4.4 today 8. HO CVA / Dementia would benefit from palliative care consult again, many comorbidities, noncompliance with HD(missing and short HD) and overall declining health. 9. DM type 2 -per  admit 10. GOC - seen by palliative care last admit in August and family (sons who are making most of the decisions) were not interested in establishing goals of care.  Lenny Pastel, PA-C [facility] Beeper [phone] 10/17/2014,8:19 AM  LOS: 2 days   Pt seen, examined and agree w A/P as above.  Vinson Moselle MD pager [phone]    cell [phone] 10/17/2014, 12:18 PM    Labs: Basic Metabolic Panel:  Recent Labs Lab 10/15/14 1427 10/16/14 0520 10/17/14 0553  NA 132* 133* 139  K 4.4 3.8 4.4  CL 91* 91* 94*  CO2 GLUCOSE 126* 98 105*  BUN 24* 27* 13  CREATININE 4.67* 5.06* 3.27*  CALCIUM 8.3* 8.2* 8.1*  PHOS  --  4.3  --    Liver Function Tests:  Recent Labs Lab 10/15/14 1427 10/16/14 0520  AST 27 17  ALT 9* 7*  ALKPHOS 132* 122  BILITOT 0.8 1.1  PROT 6.7 6.3*  ALBUMIN 1.9* 1.8*   No results for input(s): LIPASE, AMYLASE in the last 168 hours. No results for input(s): AMMONIA in the last 168 hours. CBC:  Recent Labs Lab 10/15/14 1427 10/16/14 0520 10/17/14 0553  WBC 18.4* 17.5* 17.9*  NEUTROABS 16.2*  --   --   HGB 10.0* 9.9* 10.4*  HCT 30.8* 30.7* 33.8*  MCV 86.3 86.2 87.6  PLT 105* 115* 142*   Cardiac Enzymes:  Recent Labs Lab 10/15/14 1427 10/15/14 2211 10/15/14 2317 10/16/14 0520  10/16/14 1020  TROPONINI 0.35* 0.24* 0.25* 0.22* 0.19*   CBG:  Recent Labs Lab 10/16/14 0732 10/16/14 1116 10/16/14 1809 10/16/14 2016 10/17/14 0728  GLUCAP 91 97 80 94 112*    Studies/Results: Ct Abdomen Pelvis Wo Contrast  10/15/2014   CLINICAL DATA:  69 year old male with lower abdominal discomfort since yesterday  EXAM: CT ABDOMEN AND PELVIS WITHOUT CONTRAST  TECHNIQUE: Multidetector CT imaging of the abdomen and pelvis was performed following the standard protocol without IV contrast.  COMPARISON:  Prior abdominal radiographs 08/25/2014  FINDINGS: Lower Chest: Dependent atelectasis bilaterally slightly greater on the right than  the left secondary to patient positioning. The heart is enlarged. Calcifications are present within the visualized coronary arteries. Partial calcification of the mitral valve annulus and aortic valve also noted. No pericardial effusion.  Abdomen: Unenhanced CT was performed per clinician order. Lack of IV contrast limits sensitivity and specificity, especially for evaluation of abdominal/pelvic solid viscera. Within nose limitations, unremarkable CT appearance of the stomach, duodenum, spleen, adrenal glands and pancreas. Normal hepatic contour and morphology. Atherosclerotic calcifications noted in the hepatic arteries. Numerous stones layer within the gallbladder lumen. The gallbladder is mildly distended at nearly 4.9 cm. No definite gallbladder wall thickening or pericholecystic fluid.  The bilateral native kidneys are atrophic. There are numerous calcifications within the renal arteries. Numerous low-attenuation lesions bilaterally are incompletely characterized in the absence of intravenous contrast. The lesions are low in attenuation suggesting simple cysts.  No focal bowel wall thickening or evidence of obstruction. No suspicious adenopathy. Trace free fluid in the right pericolic gutter.  Pelvis: Marked prostatomegaly. The prostate gland measures 6.9 cm in diameter and is heterogeneous with internal areas of low attenuation. This is incompletely evaluated in the absence of intravenous contrast. The seminal vesicles are also enlarged and somewhat indistinct suggesting edema. Additionally, there is a small amounts of stranding extending from the seminal vesicles into the perirectal fat. The bladder is decompressed but appears circumferentially thick walled. This may reflect bladder trabeculation in the setting of outlet obstruction related to the prostatomegaly.  Bones/Soft Tissues: Gallbladder is unremarkable. No acute fracture or aggressive appearing lytic or blastic osseous lesion.  Vascular: Extensive  calcification of all visualized arteries most consistent with medial arterial sclerosis. No aneurysm.  IMPRESSION: 1. Marked prostatomegaly with rounded areas of internal low-attenuation measuring up to 2.6 cm. In combination with bilateral enlargement of the edematous seminal vesicles and adjacent inflammatory stranding, these findings are concerning for acute prostatitis. The low-attenuation regions may represent areas of focal edema, or potentially abscesses. 2. Trabeculated bladder wall suggests associated bladder outlet obstruction. The bladder is not distended. 3. Trace free fluid in the right pericolic gutter is nonspecific but abnormal. This may be secondary to an underlying infectious/inflammatory process of the small or large bowel which is otherwise occult by imaging. 4. Distended gallbladder containing sludge and small stones. 5. Borderline cardiomegaly. 6. Atrophic native kidneys. 7. Extensive atherosclerotic vascular calcifications consistent with medial arterial sclerosis which is often seen in the setting of longstanding chronic renal disease. These results were called by telephone at the time of interpretation on 10/15/2014 at 7:10 pm to Dr. Juleen China, who verbally acknowledged these results.   Electronically Signed   By: Malachy Moan M.D.   On: 10/15/2014 19:11   Dg Chest 2 View  10/15/2014   CLINICAL DATA:  69 year old male with abdominal pain for 2 days, fatigue. Dialysis yesterday. Initial encounter.  EXAM: CHEST  2 VIEW  COMPARISON:  08/27/2014 and earlier.  FINDINGS: Seated AP and lateral views of the chest. Mild calcified plaque along the right hemidiaphragm appears stable. Stable lung volumes. Stable cardiomegaly and mediastinal contours. Extensive calcified atherosclerosis of the aorta, and including the great vessels on the right. Pulmonary vascularity is increased compared to prior studies. Right IJ approach dialysis catheter is been removed. No pneumothorax or pleural effusion. No  consolidation. Osteopenia. No acute osseous abnormality identified.  IMPRESSION: Pulmonary vascular congestion / mild interstitial edema appears increased from prior studies. No definite pleural effusion and otherwise stable chest with cardiomegaly, and calcified aortic atherosclerosis.   Electronically Signed   By: Odessa Fleming M.D.   On: 10/15/2014 15:25   Medications:   . antiseptic oral rinse  7 mL Mouth Rinse BID  . atorvastatin  40 mg Oral QHS  . carvedilol  10 mg Oral Daily  . cinacalcet  180 mg Oral Q supper  . darbepoetin (ARANESP) injection - DIALYSIS  60 mcg Intravenous Q Thu-HD  . docusate sodium  100 mg Oral BID  . ferric gluconate (FERRLECIT/NULECIT) IV  62.5 mg Intravenous Weekly  . gabapentin  100 mg Oral Daily  . heparin subcutaneous  5,000 Units Subcutaneous 3 times per day  . insulin aspart  0-5 Units Subcutaneous QHS  . insulin aspart  0-9 Units Subcutaneous TID WC  . lanthanum  1,000 mg Oral TID WC  . lisinopril  5 mg Oral Daily  . multivitamin  1 tablet Oral QHS  . pantoprazole  40 mg Oral BID  . piperacillin-tazobactam (ZOSYN)  IV  2.25 g Intravenous Q8H  . sodium chloride  3 mL Intravenous Q12H  . sodium chloride  3 mL Intravenous Q12H  . vancomycin  500 mg Intravenous Q T,Th,Sa-HD

## 2014-10-17 NOTE — Progress Notes (Signed)
Subjective: Patient reports still having some suprapubic pain.  Objective: Vital signs in last 24 hours: Temp:  [97.7 F (36.5 C)-98.9 F (37.2 C)] 98.4 F (36.9 C) (10/07 0818) Pulse Rate:  [75-83] 80 (10/07 0818) Resp:  [14-20] 17 (10/07 0818) BP: (84-168)/(44-86) 168/62 mmHg (10/07 0818) SpO2:  [97 %-100 %] 100 % (10/07 0818) Weight:  [60.2 kg (132 lb 11.5 oz)-60.4 kg (133 lb 2.5 oz)] 60.4 kg (133 lb 2.5 oz) (10/06 2018)  Intake/Output from previous day: 10/06 0701 - 10/07 0700 In: 250 [P.O.:200; IV Piggyback:50] Out: 1900  Intake/Output this shift:    Physical Exam:  Constitutional: Vital signs reviewed. WD WN in NAD   Eyes: PERRL, No scleral icterus.   Cardiovascular: RRR Pulmonary/Chest: Normal effort Abdominal: Soft. Suprapubic tenderness noted.    Lab Results:  Recent Labs  10/15/14 1427 10/16/14 0520 10/17/14 0553  HGB 10.0* 9.9* 10.4*  HCT 30.8* 30.7* 33.8*   BMET  Recent Labs  10/16/14 0520 10/17/14 0553  NA 133* 139  K 3.8 4.4  CL 91* 94*  CO2 27 26  GLUCOSE 98 105*  BUN 27* 13  CREATININE 5.06* 3.27*  CALCIUM 8.2* 8.1*   No results for input(s): LABPT, INR in the last 72 hours. No results for input(s): LABURIN in the last 72 hours. Results for orders placed or performed during the hospital encounter of 10/15/14  Blood culture (routine x 2)     Status: None (Preliminary result)   Collection Time: 10/15/14  7:40 PM  Result Value Ref Range Status   Specimen Description BLOOD RIGHT ARM  Final   Special Requests BOTTLES DRAWN AEROBIC AND ANAEROBIC 5CC  Final   Culture NO GROWTH < 24 HOURS  Final   Report Status PENDING  Incomplete  Blood culture (routine x 2)     Status: None (Preliminary result)   Collection Time: 10/15/14  7:52 PM  Result Value Ref Range Status   Specimen Description BLOOD RIGHT HAND  Final   Special Requests BOTTLES DRAWN AEROBIC AND ANAEROBIC 5CC  Final   Culture NO GROWTH < 24 HOURS  Final   Report Status PENDING   Incomplete  MRSA PCR Screening     Status: None   Collection Time: 10/15/14  9:49 PM  Result Value Ref Range Status   MRSA by PCR NEGATIVE NEGATIVE Final    Comment:        The GeneXpert MRSA Assay (FDA approved for NASAL specimens only), is one component of a comprehensive MRSA colonization surveillance program. It is not intended to diagnose MRSA infection nor to guide or monitor treatment for MRSA infections.     Studies/Results: Ct Abdomen Pelvis Wo Contrast  10/15/2014   CLINICAL DATA:  69 year old male with lower abdominal discomfort since yesterday  EXAM: CT ABDOMEN AND PELVIS WITHOUT CONTRAST  TECHNIQUE: Multidetector CT imaging of the abdomen and pelvis was performed following the standard protocol without IV contrast.  COMPARISON:  Prior abdominal radiographs 08/25/2014  FINDINGS: Lower Chest: Dependent atelectasis bilaterally slightly greater on the right than the left secondary to patient positioning. The heart is enlarged. Calcifications are present within the visualized coronary arteries. Partial calcification of the mitral valve annulus and aortic valve also noted. No pericardial effusion.  Abdomen: Unenhanced CT was performed per clinician order. Lack of IV contrast limits sensitivity and specificity, especially for evaluation of abdominal/pelvic solid viscera. Within nose limitations, unremarkable CT appearance of the stomach, duodenum, spleen, adrenal glands and pancreas. Normal hepatic contour and morphology. Atherosclerotic  calcifications noted in the hepatic arteries. Numerous stones layer within the gallbladder lumen. The gallbladder is mildly distended at nearly 4.9 cm. No definite gallbladder wall thickening or pericholecystic fluid.  The bilateral native kidneys are atrophic. There are numerous calcifications within the renal arteries. Numerous low-attenuation lesions bilaterally are incompletely characterized in the absence of intravenous contrast. The lesions are low in  attenuation suggesting simple cysts.  No focal bowel wall thickening or evidence of obstruction. No suspicious adenopathy. Trace free fluid in the right pericolic gutter.  Pelvis: Marked prostatomegaly. The prostate gland measures 6.9 cm in diameter and is heterogeneous with internal areas of low attenuation. This is incompletely evaluated in the absence of intravenous contrast. The seminal vesicles are also enlarged and somewhat indistinct suggesting edema. Additionally, there is a small amounts of stranding extending from the seminal vesicles into the perirectal fat. The bladder is decompressed but appears circumferentially thick walled. This may reflect bladder trabeculation in the setting of outlet obstruction related to the prostatomegaly.  Bones/Soft Tissues: Gallbladder is unremarkable. No acute fracture or aggressive appearing lytic or blastic osseous lesion.  Vascular: Extensive calcification of all visualized arteries most consistent with medial arterial sclerosis. No aneurysm.  IMPRESSION: 1. Marked prostatomegaly with rounded areas of internal low-attenuation measuring up to 2.6 cm. In combination with bilateral enlargement of the edematous seminal vesicles and adjacent inflammatory stranding, these findings are concerning for acute prostatitis. The low-attenuation regions may represent areas of focal edema, or potentially abscesses. 2. Trabeculated bladder wall suggests associated bladder outlet obstruction. The bladder is not distended. 3. Trace free fluid in the right pericolic gutter is nonspecific but abnormal. This may be secondary to an underlying infectious/inflammatory process of the small or large bowel which is otherwise occult by imaging. 4. Distended gallbladder containing sludge and small stones. 5. Borderline cardiomegaly. 6. Atrophic native kidneys. 7. Extensive atherosclerotic vascular calcifications consistent with medial arterial sclerosis which is often seen in the setting of  longstanding chronic renal disease. These results were called by telephone at the time of interpretation on 10/15/2014 at 7:10 pm to Dr. Juleen China, who verbally acknowledged these results.   Electronically Signed   By: Malachy Moan M.D.   On: 10/15/2014 19:11   Dg Chest 2 View  10/15/2014   CLINICAL DATA:  69 year old male with abdominal pain for 2 days, fatigue. Dialysis yesterday. Initial encounter.  EXAM: CHEST  2 VIEW  COMPARISON:  08/27/2014 and earlier.  FINDINGS: Seated AP and lateral views of the chest. Mild calcified plaque along the right hemidiaphragm appears stable. Stable lung volumes. Stable cardiomegaly and mediastinal contours. Extensive calcified atherosclerosis of the aorta, and including the great vessels on the right. Pulmonary vascularity is increased compared to prior studies. Right IJ approach dialysis catheter is been removed. No pneumothorax or pleural effusion. No consolidation. Osteopenia. No acute osseous abnormality identified.  IMPRESSION: Pulmonary vascular congestion / mild interstitial edema appears increased from prior studies. No definite pleural effusion and otherwise stable chest with cardiomegaly, and calcified aortic atherosclerosis.   Electronically Signed   By: Odessa Fleming M.D.   On: 10/15/2014 15:25    Assessment/Plan:   Prostatic abscess in patient with end-stage renal disease and low urine output.  He is currently on antibiotics and stable.  I have spoken with the patient's son as well as the patient regarding surgical management is on 10/20/2014-transurethral unroofing of his prostatic abscess.  We will try to wait until Monday to perform that procedure.  I would like Dr. Sharyn Lull  to provide preoperative cardiac clearance. If thepatient is off of anticoagulants,a spinal block might be attempted versus general anesthetic.  We will arrange [facility] to bring the patient to [facility] long for this procedure, and then back to [facility] [address] after the procedure.   LOS: 2 days    Marcine Matar M 10/17/2014, 8:31 AM

## 2014-10-17 NOTE — Progress Notes (Signed)
Utilization review completed. Hattie Aguinaldo, RN, BSN. 

## 2014-10-17 NOTE — Clinical Documentation Improvement (Signed)
Internal Medicine  WOC RN documented left heel stage 2 pressure injury, present on admission.  Please document in your progress note and discharge summary if you agree with this assessment.     Left heel pressure ulcer, stage 2  Left heel pressure ulcer, other stage (please specify)  Other  Clinically Undetermined  Please exercise your independent, professional judgment when responding. A specific answer is not anticipated or expected.   Thank you, Doy Mince, RN [phone] Clinical Documentation Specialist

## 2014-10-18 DIAGNOSIS — I214 Non-ST elevation (NSTEMI) myocardial infarction: Secondary | ICD-10-CM

## 2014-10-18 DIAGNOSIS — R1030 Lower abdominal pain, unspecified: Secondary | ICD-10-CM

## 2014-10-18 DIAGNOSIS — E785 Hyperlipidemia, unspecified: Secondary | ICD-10-CM

## 2014-10-18 DIAGNOSIS — N186 End stage renal disease: Secondary | ICD-10-CM

## 2014-10-18 DIAGNOSIS — N410 Acute prostatitis: Principal | ICD-10-CM

## 2014-10-18 DIAGNOSIS — I10 Essential (primary) hypertension: Secondary | ICD-10-CM

## 2014-10-18 DIAGNOSIS — R7989 Other specified abnormal findings of blood chemistry: Secondary | ICD-10-CM

## 2014-10-18 DIAGNOSIS — N412 Abscess of prostate: Secondary | ICD-10-CM

## 2014-10-18 DIAGNOSIS — Z515 Encounter for palliative care: Secondary | ICD-10-CM

## 2014-10-18 DIAGNOSIS — Z992 Dependence on renal dialysis: Secondary | ICD-10-CM

## 2014-10-18 DIAGNOSIS — E118 Type 2 diabetes mellitus with unspecified complications: Secondary | ICD-10-CM

## 2014-10-18 LAB — CBC
HEMATOCRIT: 31 % — AB (ref 39.0–52.0)
HEMOGLOBIN: 10 g/dL — AB (ref 13.0–17.0)
MCH: 28.3 pg (ref 26.0–34.0)
MCHC: 32.3 g/dL (ref 30.0–36.0)
MCV: 87.8 fL (ref 78.0–100.0)
Platelets: 148 10*3/uL — ABNORMAL LOW (ref 150–400)
RBC: 3.53 MIL/uL — AB (ref 4.22–5.81)
RDW: 18.7 % — ABNORMAL HIGH (ref 11.5–15.5)
WBC: 16.4 10*3/uL — ABNORMAL HIGH (ref 4.0–10.5)

## 2014-10-18 LAB — BASIC METABOLIC PANEL
ANION GAP: 15 (ref 5–15)
BUN: 20 mg/dL (ref 6–20)
CALCIUM: 7.2 mg/dL — AB (ref 8.9–10.3)
CO2: 27 mmol/L (ref 22–32)
Chloride: 98 mmol/L — ABNORMAL LOW (ref 101–111)
Creatinine, Ser: 4.14 mg/dL — ABNORMAL HIGH (ref 0.61–1.24)
GFR calc non Af Amer: 14 mL/min — ABNORMAL LOW (ref >60)
GFR, EST AFRICAN AMERICAN: 16 mL/min — AB (ref >60)
GLUCOSE: 104 mg/dL — AB (ref 65–99)
POTASSIUM: 4.2 mmol/L (ref 3.5–5.1)
Sodium: 140 mmol/L (ref 135–145)

## 2014-10-18 LAB — GLUCOSE, CAPILLARY
GLUCOSE-CAPILLARY: 108 mg/dL — AB (ref 65–99)
GLUCOSE-CAPILLARY: 111 mg/dL — AB (ref 65–99)
GLUCOSE-CAPILLARY: 113 mg/dL — AB (ref 65–99)
Glucose-Capillary: 128 mg/dL — ABNORMAL HIGH (ref 65–99)

## 2014-10-18 MED ORDER — REGADENOSON 0.4 MG/5ML IV SOLN
INTRAVENOUS | Status: AC
  Filled 2014-10-18: qty 5

## 2014-10-18 MED ORDER — REGADENOSON 0.4 MG/5ML IV SOLN
0.4000 mg | Freq: Once | INTRAVENOUS | Status: AC
  Administered 2014-10-19: 0.4 mg via INTRAVENOUS
  Filled 2014-10-18: qty 5

## 2014-10-18 NOTE — Progress Notes (Signed)
PROGRESS NOTE    Greg [name] LKG:[id number] DOB: Dec 16, 1945 DOA: 10/15/2014 PCP: Rinaldo Cloud, MD  HPI/Brief narrative 69 y.o. Male with ESRD on hemodialysis TTS, Cirrhosis, Alcohol abuse, Stroke,HTN, DM, Hepatitis, Coronary artery disease, Diabetic foot with dry gangrene (followed by [facility]) and Hypercholesterolemia p/w suprapubic abdominal pain, nausea & vomiting. CT Abdomen showed prostatomegaly, prostatitis & abscess. Urology consulted and plan surgery 10/10 pending Cardiology clearance - had positive troponin's - stress test 10/9. Treated in Aug 2016 for [facility] sepsis d/t HD catheter- removed and completed 2 weeks of Meropenum.   Assessment/Plan:  Principal Problem:  Prostatitis, acute/prostatic abscess - CT abdomen & pelvis 10/5: Showed features of marked prostatomegaly and findings concerning for acute prostatitis and prosthetic abscesses. - Treating empirically with broad-spectrum IV antibiotics (vancomycin and imipenem). - Urology was consulted/Dr. Lenn Sink and plan transurethral unroofing of prostatic abscess on 10/10, pending cardiology clearance - Blood cultures 2: Negative to date. Unfortunately no urine culture was ever sent from the ER  Active Problems:  Coronary atherosclerosis with elevated troponin/probable NSTEMI - Asymptomatic of chest pain or dyspnea. - Peak troponin of 0.35 and trending down.  - Recent 2-D echo 8/16 showed EF of 50% with mild LVH - Continue statin, ACE inhibitor, beta blocker, Plavix currently on hold for surgery - Cardiology input appreciated: History of multivessel CAD, silent infero-posterior wall MI in the past with occluded 2 coronary vessels i.e. RCA and left circumflex. Initially planned for stress test today but since patient was in dialysis, postponed to 10/9.  ESRD on hemodialysis TTS - Nephrology input appreciated. Undergoing hemodialysis 10/8.   Essential hypertension - continue coreg, lisinopril. Controlled.    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 Good inpatient control.  Hyperlipidemia - continue statin   Protein-calorie malnutrition, severe -Placed on nutritional supplements, nutrition consult   Hepatitis C with cirrhosis  Anemia of chronic disease/chronic kidney disease - Stable  Thrombocytopenia - Stable  Adult failure to thrive - As per nephrology input, patient had been seen by palliative care last admit in August and family were not interested in establishing goals of care at that time. Nephrology suggests would benefit from palliative care consult again given many comorbidities, noncompliance with HD (missing and short HD) and overall declining health. However patient planned for acute treatment/surgery on Monday and will defer for future.   DVT prophylaxis: Subcutaneous heparin Code Status: full code  Family Communication: None at bedside Disposition Plan: Not medically ready    Consultants:  Urology  Cardiology  Nephrology  Procedures:  Hemodialysis  Antibiotics:  IV Rocephin 1 dose on 10/5  IV Zosyn 10/5 > 10/6   IV vancomycin 10/5 >  IV meropenem 10/7 >  Subjective: Seen at hemodialysis. Denies complaints. Flat affect.  Objective: Filed Vitals:   10/18/14 1100 10/18/14 1130 10/18/14 1200 10/18/14 1230  BP: 99/66 105/63 127/63 150/74  Pulse: 72 71 72 73  Temp:    97.9 F (36.6 C)  TempSrc:    Oral  Resp: 16 18 16 18   Weight:    58.4 kg (128 lb 12 oz)  SpO2:    98%    Intake/Output Summary (Last 24 hours) at 10/18/14 1310 Last data filed at 10/18/14 1230  Gross per 24 hour  Intake     80 ml  Output   1705  ml  Net  -1625 ml   Filed Weights   10/17/14 2059 10/18/14 0821 10/18/14 1230  Weight: 57.3 kg (126 lb 5.2 oz) 60.1 kg (132 lb 7.9 oz) 58.4 kg  (128 lb 12 oz)     Exam:  General exam: Moderately built and frail middle-aged male, chronically ill-looking lying comfortably supine in bed undergoing hemodialysis. Respiratory system: Clear. No increased work of breathing. Cardiovascular system: S1 & S2 heard, RRR. No JVD, murmurs, gallops, clicks or pedal edema. Nontele Gastrointestinal system: Abdomen is nondistended, soft and nontender. Normal bowel sounds heard. Central nervous system: Alert and oriented. No focal neurological deficits. Extremities: Symmetric 5 x 5 power.    Data Reviewed: Basic Metabolic Panel:  Recent Labs Lab 10/15/14 1427 10/16/14 0520 10/17/14 0553 10/18/14 0555  NA 132* 133* 139 140  K 4.4 3.8 4.4 4.2  CL 91* 91* 94* 98*  CO2 GLUCOSE 126* 98 105* 104*  BUN 24* 27* 13 20  CREATININE 4.67* 5.06* 3.27* 4.14*  CALCIUM 8.3* 8.2* 8.1* 7.2*  MG  --  2.2  --   --   PHOS  --  4.3  --   --    Liver Function Tests:  Recent Labs Lab 10/15/14 1427 10/16/14 0520  AST 27 17  ALT 9* 7*  ALKPHOS 132* 122  BILITOT 0.8 1.1  PROT 6.7 6.3*  ALBUMIN 1.9* 1.8*   No results for input(s): LIPASE, AMYLASE in the last 168 hours. No results for input(s): AMMONIA in the last 168 hours. CBC:  Recent Labs Lab 10/15/14 1427 10/16/14 0520 10/17/14 0553 10/18/14 0555  WBC 18.4* 17.5* 17.9* 16.4*  NEUTROABS 16.2*  --   --   --   HGB 10.0* 9.9* 10.4* 10.0*  HCT 30.8* 30.7* 33.8* 31.0*  MCV 86.3 86.2 87.6 87.8  PLT 105* 115* 142* 148*   Cardiac Enzymes:  Recent Labs Lab 10/15/14 1427 10/15/14 2211 10/15/14 2317 10/16/14 0520 10/16/14 1020  TROPONINI 0.35* 0.24* 0.25* 0.22* 0.19*   BNP (last 3 results) No results for input(s): PROBNP in the last 8760 hours. CBG:  Recent Labs Lab 10/17/14 0728 10/17/14 1111 10/17/14 1615 10/17/14 2051 10/18/14 0730  GLUCAP 112* 110* 111* 163* 108*    Recent Results (from the past 240 hour(s))  Blood culture (routine x 2)     Status: None  (Preliminary result)   Collection Time: 10/15/14  7:40 PM  Result Value Ref Range Status   Specimen Description BLOOD RIGHT ARM  Final   Special Requests BOTTLES DRAWN AEROBIC AND ANAEROBIC 5CC  Final   Culture NO GROWTH 3 DAYS  Final   Report Status PENDING  Incomplete  Blood culture (routine x 2)     Status: None (Preliminary result)   Collection Time: 10/15/14  7:52 PM  Result Value Ref Range Status   Specimen Description BLOOD RIGHT HAND  Final   Special Requests BOTTLES DRAWN AEROBIC AND ANAEROBIC 5CC  Final   Culture NO GROWTH 3 DAYS  Final   Report Status PENDING  Incomplete  MRSA PCR Screening     Status: None   Collection Time: 10/15/14  9:49 PM  Result Value Ref Range Status   MRSA by PCR NEGATIVE NEGATIVE Final    Comment:        The GeneXpert MRSA Assay (FDA approved for NASAL specimens only), is one component of a comprehensive MRSA colonization surveillance program. It is not intended to diagnose MRSA infection nor to guide or  monitor treatment for MRSA infections.         Studies: No results found.      Scheduled Meds: . antiseptic oral rinse  7 mL Mouth Rinse BID  . atorvastatin  40 mg Oral QHS  . carvedilol  10 mg Oral Daily  . cinacalcet  180 mg Oral Q supper  . darbepoetin (ARANESP) injection - DIALYSIS  60 mcg Intravenous Q Thu-HD  . docusate sodium  100 mg Oral BID  . feeding supplement  1 Container Oral TID BM  . ferric gluconate (FERRLECIT/NULECIT) IV  62.5 mg Intravenous Weekly  . gabapentin  100 mg Oral Daily  . heparin subcutaneous  5,000 Units Subcutaneous 3 times per day  . insulin aspart  0-5 Units Subcutaneous QHS  . insulin aspart  0-9 Units Subcutaneous TID WC  . lanthanum  1,000 mg Oral TID WC  . lisinopril  5 mg Oral Daily  . meropenem (MERREM) IV  500 mg Intravenous Q24H  . multivitamin  1 tablet Oral QHS  . pantoprazole  40 mg Oral BID  . regadenoson  0.4 mg Intravenous Once  . sodium chloride  3 mL Intravenous Q12H  .  sodium chloride  3 mL Intravenous Q12H  . vancomycin  500 mg Intravenous Q T,Th,Sa-HD   Continuous Infusions:   Principal Problem:   Prostatitis, acute Active Problems:   Essential hypertension   Coronary atherosclerosis   CVA (cerebral vascular accident) (HCC)   ESRD on hemodialysis (HCC)   Type 2 diabetes mellitus with complication (HCC)   Hyperlipidemia   ESRD (end stage renal disease) (HCC)   Protein-calorie malnutrition, severe (HCC)   Elevated troponin    Time spent: 25 minutes.    Marcellus Scott, MD, FACP, FHM. [facility] Pager [phone]  If 7PM-7AM, please contact night-coverage [email or web address] Password [email or web address] 10/18/2014, 1:10 PM    LOS: 3 days

## 2014-10-18 NOTE — Consult Note (Signed)
Consultation Note Date: 10/18/2014   Patient Name: Greg [name]  DOB: Nov 02, 1945  MRN: [id number]  Age / Sex: 69 y.o., male   PCP: Rinaldo Cloud, MD Referring Physician: Elease Etienne, MD  Reason for Consultation: Establishing goals of care  Palliative Care Assessment and Plan Summary of Established Goals of Care and Medical Treatment Preferences  69 y.o. Male with ESRD on hemodialysis TTS, Cirrhosis, Alcohol abuse, Stroke,HTN, DM, Hepatitis, Coronary artery disease, Diabetic foot with dry gangrene (followed by [facility]) and Hypercholesterolemia p/w suprapubic abdominal pain, nausea & vomiting. CT Abdomen showed prostatomegaly, prostatitis & abscess. Urology consulted and plan surgery 10/10 pending Cardiology clearance - had positive troponin's - stress test 10/9. Treated in Aug 2016 for [facility] sepsis d/t HD catheter- removed and completed 2 weeks of Meropenum.  The patient has been admitted with a working diagnosis of prostatitis. CT scan of the abdomen and pelvis on 10/15/14 showed acute prostatitis and prostatic abscess. The patient has been placed on broad-spectrum antibiotics. He has been seen and evaluated by urology. Cardiology clearance, stress test to be done on 10/90/16 and transurethral unroofing of prosthetic abscess on 10/20/2014 is in place.  Palliative consultation has been requested, essentially as a follow-up from previous palliative consultation in the context of a previous hospitalization, as well as in light of the patient's multiple acute on chronic illnesses the most prominent of which is end-stage renal disease and patient's hemodialysis dependency.  The patient is a pleasant 69 year old [place] gentleman resting in bed. He is sipping tea that his wife has brought for him. He wishes to go home. His wife, 2 sons, daughter, daughter-in-law are all present at the bedside. They wish for continuation of dialysis. They're looking forward to the patient undergoing his urological  procedure on Monday. They endorse hope and at this point in time, would not wish to entertain any further discussions pertaining to CODE STATUS or additional goals of care.  Thank you for this consultation. We will sign off for now.   Contacts/Participants in Discussion: Primary Decision Maker: Patient, his wife. He has 2 sons and her daughter.   HCPOA: yes  Wife and 2 sons. Family makes decisions collectively with everybody's input and opinion.  Code Status/Advance Care Planning:  Full code  Symptom Management:   Abdominal pain. Currently well controlled. Continue current regimen  Palliative Prophylaxis: Yes  Additional Recommendations (Limitations, Scope, Preferences):  Full code, any and all life maintaining/life-prolonging therapies and measures to be implemented. Patient and family hopeful for patient's recovery from this acute infection Psycho-social/Spiritual:   Support System: Strong, wife, 2 sons and a daughter.  Desire for further Chaplaincy support:no  Prognosis: Unable to determine  Discharge Planning:  Home with Home Health    Values: Continue any and all life prolonging/maintaining therapies and measures. Discussed in detail. Life limiting illness: Prostatic abscess a 69 year old gentleman with multiple acute and chronic comorbidities including end-stage renal disease, history of stroke hypertension diabetes hepatitis, history of diabetic foot with dry gangrene      Chief Complaint/History of Present Illness: Suprapubic pain nausea vomiting  Primary Diagnoses  Present on Admission:  . Essential hypertension . Coronary atherosclerosis . Elevated troponin . Prostatitis, acute . Type 2 diabetes mellitus with complication (HCC) . Protein-calorie malnutrition, severe (HCC) . Hyperlipidemia . ESRD (end stage renal disease) (HCC) . CVA (cerebral vascular accident) [facility])  Palliative Review of Systems: Completed I have reviewed the medical record,  interviewed the patient and family, and examined the patient. The following aspects are  pertinent.  Past Medical History  Diagnosis Date  . ESRD on hemodialysis (HCC)     Started HD around 2006.   [facility] Cirrhosis (HCC)   . Alcohol abuse   . Stroke [facility])     left sided deficits  . Hypertension   . Diabetes mellitus     type 2   . Hepatitis     hep c  . Constipation   . Coronary artery disease   . Hypercholesterolemia    Social History   Social History  . Marital Status: Married    Spouse Name: N/A  . Number of Children: N/A  . Years of Education: N/A   Social History Main Topics  . Smoking status: Former Smoker    Quit date: 01/10/2006  . Smokeless tobacco: Never Used  . Alcohol Use: No     Comment: Former  . Drug Use: No  . Sexual Activity: Not Asked   Other Topics Concern  . None   Social History Narrative   Family History  Problem Relation Age of Onset  . Diabetes Mother   . Hypertension Mother   . Diabetes Father   . Hypertension Father    Scheduled Meds: . antiseptic oral rinse  7 mL Mouth Rinse BID  . atorvastatin  40 mg Oral QHS  . carvedilol  10 mg Oral Daily  . cinacalcet  180 mg Oral Q supper  . darbepoetin (ARANESP) injection - DIALYSIS  60 mcg Intravenous Q Thu-HD  . docusate sodium  100 mg Oral BID  . feeding supplement  1 Container Oral TID BM  . ferric gluconate (FERRLECIT/NULECIT) IV  62.5 mg Intravenous Weekly  . gabapentin  100 mg Oral Daily  . heparin subcutaneous  5,000 Units Subcutaneous 3 times per day  . insulin aspart  0-5 Units Subcutaneous QHS  . insulin aspart  0-9 Units Subcutaneous TID WC  . lanthanum  1,000 mg Oral TID WC  . lisinopril  5 mg Oral Daily  . meropenem (MERREM) IV  500 mg Intravenous Q24H  . multivitamin  1 tablet Oral QHS  . pantoprazole  40 mg Oral BID  . regadenoson  0.4 mg Intravenous Once  . sodium chloride  3 mL Intravenous Q12H  . sodium chloride  3 mL Intravenous Q12H  . vancomycin  500 mg Intravenous Q  T,Th,Sa-HD   Continuous Infusions:  PRN Meds:.sodium chloride, acetaminophen **OR** acetaminophen, ondansetron **OR** ondansetron (ZOFRAN) IV, oxyCODONE-acetaminophen, polyethylene glycol, sodium chloride Medications Prior to Admission:  Prior to Admission medications   Medication Sig Start Date End Date Taking? Authorizing Provider  atorvastatin (LIPITOR) 40 MG tablet Take 40 mg by mouth at bedtime.   Yes Historical Provider, MD  calcium acetate (PHOSLO) 667 MG capsule Take 1,334-2,001 mg by mouth See admin instructions. Take 3 capsules by mouth 3 times daily and take 1 capsule by mouth with snacks twice daily 03/26/14  Yes Historical Provider, MD  COREG CR 10 MG 24 hr capsule Take 10 mg by mouth daily.  07/17/14  Yes Historical Provider, MD  gabapentin (NEURONTIN) 100 MG capsule Take 100 mg by mouth daily.  07/17/14  Yes Historical Provider, MD  lisinopril (PRINIVIL,ZESTRIL) 5 MG tablet Take 5 mg by mouth daily.  07/17/14  Yes Historical Provider, MD  NOVOLIN 70/30 (70-30) 100 UNIT/ML injection Inject 10 Units into the skin daily. 09/17/14  Yes Historical Provider, MD  oxyCODONE-acetaminophen (PERCOCET/ROXICET) 5-325 MG per tablet Take 1 tablet by mouth every 4 (four) hours as needed for severe  pain. 09/10/14  Yes Fransisco Hertz, MD  amiodarone (PACERONE) 200 MG tablet Take 1 tablet (200 mg total) by mouth daily. Patient not taking: Reported on 10/15/2014 08/30/14   Marinda Elk, MD  clopidogrel (PLAVIX) 75 MG tablet Take 1 tablet (75 mg total) by mouth every other day. 09/08/14   Marinda Elk, MD  docusate sodium (COLACE) 100 MG capsule Take 1 capsule (100 mg total) by mouth 2 (two) times daily. 07/10/13   Reuben Likes, MD  fluconazole (DIFLUCAN) 100 MG tablet Take 1 tablet (100 mg total) by mouth daily. Patient not taking: Reported on 09/10/2014 08/30/14   Marinda Elk, MD  lanthanum (FOSRENOL) 1000 MG chewable tablet Chew 1 tablet (1,000 mg total) by mouth 3 (three) times daily with  meals. 08/30/14   Marinda Elk, MD  meropenem 500 mg in sodium chloride 0.9 % 50 mL Inject 500 mg into the vein every other day. 08/31/14   Marinda Elk, MD  Nutritional Supplements (FEEDING SUPPLEMENT, NEPRO CARB STEADY,) LIQD Take 237 mLs by mouth as needed (missed meal during dialysis.). 08/30/14   Marinda Elk, MD  oxyCODONE (OXY IR/ROXICODONE) 5 MG immediate release tablet Take 1 tablet (5 mg total) by mouth every 4 (four) hours as needed for moderate pain. Patient not taking: Reported on 09/10/2014 08/30/14   Marinda Elk, MD  pantoprazole (PROTONIX) 40 MG tablet Take 1 tablet (40 mg total) by mouth 2 (two) times daily. 08/30/14   Marinda Elk, MD  polyethylene glycol [facility]) packet Take 17 g by mouth daily as needed. 08/30/14   Marinda Elk, MD  sorbitol 70 % solution  07/17/14   Historical Provider, MD   No Known Allergies CBC:    Component Value Date/Time   WBC 16.4* 10/18/2014 0555   HGB 10.0* 10/18/2014 0555   HCT 31.0* 10/18/2014 0555   PLT 148* 10/18/2014 0555   MCV 87.8 10/18/2014 0555   NEUTROABS 16.2* 10/15/2014 1427   LYMPHSABS 1.0 10/15/2014 1427   MONOABS 1.1* 10/15/2014 1427   EOSABS 0.0 10/15/2014 1427   BASOSABS 0.0 10/15/2014 1427   Comprehensive Metabolic Panel:    Component Value Date/Time   NA 140 10/18/2014 0555   K 4.2 10/18/2014 0555   CL 98* 10/18/2014 0555   CO2 27 10/18/2014 0555   BUN 20 10/18/2014 0555   CREATININE 4.14* 10/18/2014 0555   GLUCOSE 104* 10/18/2014 0555   CALCIUM 7.2* 10/18/2014 0555   AST 17 10/16/2014 0520   ALT 7* 10/16/2014 0520   ALKPHOS 122 10/16/2014 0520   BILITOT 1.1 10/16/2014 0520   PROT 6.3* 10/16/2014 0520   ALBUMIN 1.8* 10/16/2014 0520    Physical Exam: Vital Signs: BP 146/62 mmHg  Pulse 76  Temp(Src) 98.6 F (37 C) (Axillary)  Resp 18  Wt 58.4 kg (128 lb 12 oz)  SpO2 100% SpO2: SpO2: 100 % O2 Device: O2 Device: Not Delivered O2 Flow Rate:   Intake/output  summary:  Intake/Output Summary (Last 24 hours) at 10/18/14 1855 Last data filed at 10/18/14 1806  Gross per 24 hour  Intake    120 ml  Output   1705 ml  Net  -1585 ml   LBM: Last BM Date: 10/17/14 Baseline Weight: Weight: 60.328 kg (133 lb) Most recent weight: Weight: 58.4 kg (128 lb 12 oz)  Exam Findings:  Awake alert resting in bed appears chronically ill Denies any acute complaints  Clear to auscultation diminished at bases S1-S2  Abdomen soft mildly distended mild suprapubic tenderness Extremity is with no edema Nonfocal               Palliative Performance Scale: 30   Additional Data Reviewed: Recent Labs     10/17/14  0553  10/18/14  0555  WBC  17.9*  16.4*  HGB  10.4*  10.0*  PLT  142*  148*  NA  139  140  BUN  13  20  CREATININE  3.27*  4.14*     Time In: 1700 Time Out: 1800 Time Total: 60 Greater than 50%  of this time was spent counseling and coordinating care related to the above assessment and plan.  Signed by: Rosalin Hawking, MD [phone] Rosalin Hawking, MD  10/18/2014, 6:55 PM  Please contact Palliative Medicine Team phone at [phone] for questions and concerns.

## 2014-10-18 NOTE — Progress Notes (Signed)
5 Subjective:  Denies any chest pain or shortness of breath seen in hemodialysis. Nuclear stress test could not be done today due to scheduling problems Reschedule for tomorrow  Objective:  Vital Signs in the last 24 hours: Temp:  [97.6 F (36.4 C)-98.2 F (36.8 C)] 97.9 F (36.6 C) (10/08 1230) Pulse Rate:  [71-88] 73 (10/08 1230) Resp:  [16-24] 18 (10/08 1230) BP: (97-165)/(56-76) 150/74 mmHg (10/08 1230) SpO2:  [95 %-100 %] 98 % (10/08 1230) Weight:  [57.3 kg (126 lb 5.2 oz)-60.1 kg (132 lb 7.9 oz)] 58.4 kg (128 lb 12 oz) (10/08 1230)  Intake/Output from previous day: 10/07 0701 - 10/08 0700 In: 190 [P.O.:140; IV Piggyback:50] Out: 0  Intake/Output from this shift: Total I/O In: -  Out: 1705 [Other:1705]  Physical Exam: Exam unchanged  Lab Results:  Recent Labs  10/17/14 0553 10/18/14 0555  WBC 17.9* 16.4*  HGB 10.4* 10.0*  PLT 142* 148*    Recent Labs  10/17/14 0553 10/18/14 0555  NA 139 140  K 4.4 4.2  CL 94* 98*  CO2 26 27  GLUCOSE 105* 104*  BUN 13 20  CREATININE 3.27* 4.14*    Recent Labs  10/16/14 0520 10/16/14 1020  TROPONINI 0.22* 0.19*   Hepatic Function Panel  Recent Labs  10/16/14 0520  PROT 6.3*  ALBUMIN 1.8*  AST 17  ALT 7*  ALKPHOS 122  BILITOT 1.1   No results for input(s): CHOL in the last 72 hours. No results for input(s): PROTIME in the last 72 hours.  Imaging: Imaging results have been reviewed and No results found.  Cardiac Studies:  Assessment/Plan:  Probable very small non-Q-wave myocardial infarction. 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 2 coronary vessels, i.e., RCA and left circumflex in the past. Prostatic abscess. Hypertension. Diabetes mellitus. 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 History of CVA with left paresis. History of hepatitis C with  cirrhosis of liver. End-stage liver disease on hemodialysis. Hyperlipidemia. Anemia of chronic disease. Plan Nothing by mouth after midnight Reschedule for nuclear stress test in a.m.  LOS: 3 days    Rinaldo Cloud 10/18/2014, 1:42 PM

## 2014-10-18 NOTE — Progress Notes (Signed)
Bladder scanner on unit is broken.  Unable to perform bladder scan at this time.  Will notify on-coming shift to retrieve bladder scanner from another unit to scan patient.

## 2014-10-18 NOTE — Progress Notes (Signed)
  Subjective: No changes overnight Patient is complaining of some thoracic pain Seen by cardiology yesterday, stress test scheduled for today.  Objective: Vital signs in last 24 hours: Temp:  [98 F (36.7 C)-98.4 F (36.9 C)] 98 F (36.7 C) (10/08 0500) Pulse Rate:  [73-88] 73 (10/08 0500) Resp:  [16-18] 16 (10/08 0500) BP: (139-168)/(56-62) 139/59 mmHg (10/08 0500) SpO2:  [99 %-100 %] 99 % (10/08 0500) Weight:  [57.3 kg (126 lb 5.2 oz)] 57.3 kg (126 lb 5.2 oz) (10/07 2059)  Intake/Output from previous day: 10/07 0701 - 10/08 0700 In: 190 [P.O.:140; IV Piggyback:50] Out: 0  Intake/Output this shift:    Physical Exam:  Constitutional: Vital signs reviewed. WD WN in NAD   Eyes: PERRL, No scleral icterus.   Cardiovascular: RRR Pulmonary/Chest: Normal effort Abdominal: Soft. Suprapubic tenderness noted.    Lab Results:  Recent Labs  10/16/14 0520 10/17/14 0553 10/18/14 0555  HGB 9.9* 10.4* 10.0*  HCT 30.7* 33.8* 31.0*   BMET  Recent Labs  10/17/14 0553 10/18/14 0555  NA 139 140  K 4.4 4.2  CL 94* 98*  CO2 26 27  GLUCOSE 105* 104*  BUN 13 20  CREATININE 3.27* 4.14*  CALCIUM 8.1* 7.2*   No results for input(s): LABPT, INR in the last 72 hours. No results for input(s): LABURIN in the last 72 hours. Results for orders placed or performed during the hospital encounter of 10/15/14  Blood culture (routine x 2)     Status: None (Preliminary result)   Collection Time: 10/15/14  7:40 PM  Result Value Ref Range Status   Specimen Description BLOOD RIGHT ARM  Final   Special Requests BOTTLES DRAWN AEROBIC AND ANAEROBIC 5CC  Final   Culture NO GROWTH 2 DAYS  Final   Report Status PENDING  Incomplete  Blood culture (routine x 2)     Status: None (Preliminary result)   Collection Time: 10/15/14  7:52 PM  Result Value Ref Range Status   Specimen Description BLOOD RIGHT HAND  Final   Special Requests BOTTLES DRAWN AEROBIC AND ANAEROBIC 5CC  Final   Culture NO GROWTH  2 DAYS  Final   Report Status PENDING  Incomplete  MRSA PCR Screening     Status: None   Collection Time: 10/15/14  9:49 PM  Result Value Ref Range Status   MRSA by PCR NEGATIVE NEGATIVE Final    Comment:        The GeneXpert MRSA Assay (FDA approved for NASAL specimens only), is one component of a comprehensive MRSA colonization surveillance program. It is not intended to diagnose MRSA infection nor to guide or monitor treatment for MRSA infections.     Studies/Results: No results found.  Assessment/Plan:  Prostatic abscess in patient with end-stage renal disease and low urine output.  He is currently on antibiotics and stable. He has been afebrile, white count remains unchanged. Evaluation by cardiology yesterday for preoperative clearance demonstrated some EKG changes which will be further evaluated today. The plan currently is to proceed with unroofing of his prostatic abscess on Monday morning by Dr. Retta Diones, pending clearance from cardiology. We will continue to follow along.   LOS: 3 days   Crist Fat 10/18/2014, 7:19 AM

## 2014-10-18 NOTE — Progress Notes (Signed)
Subjective:   Feels bad- doesn't elaborate- wont answer other questions  Objective Filed Vitals:   10/17/14 2059 10/18/14 0500 10/18/14 0830 10/18/14 0835  BP: 149/56 139/59 165/66 163/76  Pulse: 76 73 73 75  Temp: 98.2 F (36.8 C) 98 F (36.7 C)    TempSrc: Oral Oral    Resp: 18 16 18 18   Weight: 57.3 kg (126 lb 5.2 oz)     SpO2: 99% 99%     Physical Exam General: chronicially ill appearing no acute distress.   Heart: RRR  Lungs: CTA, shallow, unlabored  Abdomen: soft, nontender +BS  Extremities: no edema Dialysis Access:  L AVF patent on HD   Dialysis Orders: TTS [facility] 4 hrs 61.5kgs 2K/2Ca 1900 heparin L AVF venofer 50 q week micera 75 q 2 weeks- last dose given 9/  Problem/Plan: 1. Lower abd pain / acute prostatitis with Abscess - urology following. Antibiotics started. Plan for OR Monday for transurethral unroofing of abscess, after Cardiology to clears before sur- stress test pending 2. ESRD - TTS [facility]- HD today 3. Hypertension/volume -BPs variable. Continue coreg and lisinprol- under edw- lower as toelrated 4. Anemia - hgb10 ESA thursday Cont Fe 5. Metabolic bone disease - Continue sensispar and fosrenol. phos 4.3 and PTH 1043. No vit D with borderline Ca+/ 10 corrected.  6. Coronary atherosclerosis with elevated troponin- no chest pain or shortness of breath/ Cardiology to clear before Urology surg. 7. Nutrition - alb 1.8 poor intake, has been losing wt. Vegetarian diet- K+ 3.8 admit >4.4 today 8. HO CVA / Dementia would benefit from palliative care consult again, many comorbidities, noncompliance with HD(missing and short HD) and overall declining health. 9. DM type 2 -per admit 10. GOC - seen by palliative care last admit in August and family (sons who are making most of the decisions) were not interested in establishing goals of care at that time  Jetty Duhamel, NP [facility] Beeper [phone] 10/18/2014,9:30 AM  LOS: 3 days   Pt  seen, examined and agree w A/P as above.  Vinson Moselle MD pager [phone]    cell [phone] 10/18/2014, 10:56 AM     Additional Objective Labs: Basic Metabolic Panel:  Recent Labs Lab 10/16/14 0520 10/17/14 0553 10/18/14 0555  NA 133* 139 140  K 3.8 4.4 4.2  CL 91* 94* 98*  CO2 27 26 27   GLUCOSE 98 105* 104*  BUN 27* 13 20  CREATININE 5.06* 3.27* 4.14*  CALCIUM 8.2* 8.1* 7.2*  PHOS 4.3  --   --    Liver Function Tests:  Recent Labs Lab 10/15/14 1427 10/16/14 0520  AST 27 17  ALT 9* 7*  ALKPHOS 132* 122  BILITOT 0.8 1.1  PROT 6.7 6.3*  ALBUMIN 1.9* 1.8*   No results for input(s): LIPASE, AMYLASE in the last 168 hours. CBC:  Recent Labs Lab 10/15/14 1427 10/16/14 0520 10/17/14 0553 10/18/14 0555  WBC 18.4* 17.5* 17.9* 16.4*  NEUTROABS 16.2*  --   --   --   HGB 10.0* 9.9* 10.4* 10.0*  HCT 30.8* 30.7* 33.8* 31.0*  MCV 86.3 86.2 87.6 87.8  PLT 105* 115* 142* 148*   Blood Culture    Component Value Date/Time   SDES BLOOD RIGHT HAND 10/15/2014 1952   SPECREQUEST BOTTLES DRAWN AEROBIC AND ANAEROBIC 5CC 10/15/2014 1952   CULT NO GROWTH 3 DAYS 10/15/2014 1952   REPTSTATUS PENDING 10/15/2014 1952    Cardiac Enzymes:  Recent Labs Lab 10/15/14 1427 10/15/14 2211 10/15/14 2317  10/16/14 0520 10/16/14 1020  TROPONINI 0.35* 0.24* 0.25* 0.22* 0.19*   CBG:  Recent Labs Lab 10/17/14 0728 10/17/14 1111 10/17/14 1615 10/17/14 2051 10/18/14 0730  GLUCAP 112* 110* 111* 163* 108*   Iron Studies: No results for input(s): IRON, TIBC, TRANSFERRIN, FERRITIN in the last 72 hours. @ Studies/Results: No results found. Medications:   . antiseptic oral rinse  7 mL Mouth Rinse BID  . atorvastatin  40 mg Oral QHS  . carvedilol  10 mg Oral Daily  . cinacalcet  180 mg Oral Q supper  . darbepoetin (ARANESP) injection - DIALYSIS  60 mcg Intravenous Q Thu-HD  . docusate sodium  100 mg Oral BID  . feeding supplement  1 Container Oral TID BM  .  ferric gluconate (FERRLECIT/NULECIT) IV  62.5 mg Intravenous Weekly  . gabapentin  100 mg Oral Daily  . heparin subcutaneous  5,000 Units Subcutaneous 3 times per day  . insulin aspart  0-5 Units Subcutaneous QHS  . insulin aspart  0-9 Units Subcutaneous TID WC  . lanthanum  1,000 mg Oral TID WC  . lisinopril  5 mg Oral Daily  . meropenem (MERREM) IV  500 mg Intravenous Q24H  . multivitamin  1 tablet Oral QHS  . pantoprazole  40 mg Oral BID  . regadenoson  0.4 mg Intravenous Once  . sodium chloride  3 mL Intravenous Q12H  . sodium chloride  3 mL Intravenous Q12H  . vancomycin  500 mg Intravenous Q T,Th,Sa-HD

## 2014-10-19 ENCOUNTER — Inpatient Hospital Stay (HOSPITAL_COMMUNITY): Payer: Medicare Other

## 2014-10-19 LAB — GLUCOSE, CAPILLARY
GLUCOSE-CAPILLARY: 113 mg/dL — AB (ref 65–99)
GLUCOSE-CAPILLARY: 128 mg/dL — AB (ref 65–99)
Glucose-Capillary: 125 mg/dL — ABNORMAL HIGH (ref 65–99)
Glucose-Capillary: 153 mg/dL — ABNORMAL HIGH (ref 65–99)

## 2014-10-19 LAB — CBC
HCT: 31 % — ABNORMAL LOW (ref 39.0–52.0)
HEMOGLOBIN: 9.8 g/dL — AB (ref 13.0–17.0)
MCH: 27.4 pg (ref 26.0–34.0)
MCHC: 31.6 g/dL (ref 30.0–36.0)
MCV: 86.6 fL (ref 78.0–100.0)
PLATELETS: 151 10*3/uL (ref 150–400)
RBC: 3.58 MIL/uL — ABNORMAL LOW (ref 4.22–5.81)
RDW: 18.5 % — AB (ref 11.5–15.5)
WBC: 14.5 10*3/uL — ABNORMAL HIGH (ref 4.0–10.5)

## 2014-10-19 MED ORDER — REGADENOSON 0.4 MG/5ML IV SOLN
INTRAVENOUS | Status: AC
  Administered 2014-10-19: 0.4 mg via INTRAVENOUS
  Filled 2014-10-19: qty 5

## 2014-10-19 MED ORDER — DEXTROSE-NACL 5-0.45 % IV SOLN
INTRAVENOUS | Status: DC
  Administered 2014-10-20: 22:00:00 via INTRAVENOUS

## 2014-10-19 MED ORDER — TECHNETIUM TC 99M SESTAMIBI - CARDIOLITE
30.0000 | Freq: Once | INTRAVENOUS | Status: AC | PRN
  Administered 2014-10-19: 30 via INTRAVENOUS

## 2014-10-19 MED ORDER — TECHNETIUM TC 99M SESTAMIBI GENERIC - CARDIOLITE
10.0000 | Freq: Once | INTRAVENOUS | Status: AC | PRN
  Administered 2014-10-19: 10 via INTRAVENOUS

## 2014-10-19 NOTE — Progress Notes (Addendum)
  Subjective: No changes overnight Seen by cardiology, stress test scheduled for today.  Objective: Vital signs in last 24 hours: Temp:  [98.6 F (37 C)] 98.6 F (37 C) (10/09 0439) Pulse Rate:  [67-75] 75 (10/09 1057) Resp:  [15-18] 18 (10/09 0439) BP: (115-172)/(52-68) 172/68 mmHg (10/09 1057) SpO2:  [100 %] 100 % (10/09 0439) Weight:  [56 kg (123 lb 7.3 oz)] 56 kg (123 lb 7.3 oz) (10/08 2025)  Intake/Output from previous day: 10/08 0701 - 10/09 0700 In: 120 [P.O.:120] Out: 1705  Intake/Output this shift:    Physical Exam:  Constitutional: Vital signs reviewed. WD WN in NAD   Eyes: PERRL, No scleral icterus.   Cardiovascular: RRR Pulmonary/Chest: Normal effort Abdominal: Soft. Suprapubic tenderness noted.    Lab Results:  Recent Labs  10/17/14 0553 10/18/14 0555 10/19/14 0655  HGB 10.4* 10.0* 9.8*  HCT 33.8* 31.0* 31.0*   BMET  Recent Labs  10/17/14 0553 10/18/14 0555  NA 139 140  K 4.4 4.2  CL 94* 98*  CO2 26 27  GLUCOSE 105* 104*  BUN 13 20  CREATININE 3.27* 4.14*  CALCIUM 8.1* 7.2*   No results for input(s): LABPT, INR in the last 72 hours. No results for input(s): LABURIN in the last 72 hours. Results for orders placed or performed during the hospital encounter of 10/15/14  Blood culture (routine x 2)     Status: None (Preliminary result)   Collection Time: 10/15/14  7:40 PM  Result Value Ref Range Status   Specimen Description BLOOD RIGHT ARM  Final   Special Requests BOTTLES DRAWN AEROBIC AND ANAEROBIC 5CC  Final   Culture NO GROWTH 4 DAYS  Final   Report Status PENDING  Incomplete  Blood culture (routine x 2)     Status: None (Preliminary result)   Collection Time: 10/15/14  7:52 PM  Result Value Ref Range Status   Specimen Description BLOOD RIGHT HAND  Final   Special Requests BOTTLES DRAWN AEROBIC AND ANAEROBIC 5CC  Final   Culture NO GROWTH 4 DAYS  Final   Report Status PENDING  Incomplete  MRSA PCR Screening     Status: None   Collection Time: 10/15/14  9:49 PM  Result Value Ref Range Status   MRSA by PCR NEGATIVE NEGATIVE Final    Comment:        The GeneXpert MRSA Assay (FDA approved for NASAL specimens only), is one component of a comprehensive MRSA colonization surveillance program. It is not intended to diagnose MRSA infection nor to guide or monitor treatment for MRSA infections.     Studies/Results: No results found.  Assessment/Plan:  Prostatic abscess in patient with end-stage renal disease and low urine output.  He is currently on antibiotics and stable. He has been afebrile, white count remains unchanged. Evaluation by cardiology for preoperative clearance demonstrated some EKG changes which will be further evaluated today. The plan currently is to proceed with unroofing of his prostatic abscess on Monday morning by Dr. Retta Diones, pending clearance from cardiology. We will continue to follow along.   LOS: 4 days   Greg [name] 10/19/2014, 1:48 PM

## 2014-10-19 NOTE — Progress Notes (Signed)
PROGRESS NOTE    Greg [name] FHL:[id number] DOB: 05-19-1945 DOA: 10/15/2014 PCP: Rinaldo Cloud, MD  HPI/Brief narrative 69 y.o. Male with ESRD on hemodialysis TTS, Cirrhosis, Alcohol abuse, Stroke,HTN, DM, Hepatitis, Coronary artery disease, Diabetic foot with dry gangrene (followed by [facility]) and Hypercholesterolemia p/w suprapubic abdominal pain, nausea & vomiting. CT Abdomen showed prostatomegaly, prostatitis & abscess. Urology consulted and plan surgery 10/10 pending Cardiology clearance - had positive troponin's - stress test 10/9. Treated in Aug 2016 for [facility] sepsis d/t HD catheter- removed and completed 2 weeks of Meropenum.   Assessment/Plan:  Principal Problem:  Prostatitis, acute/prostatic abscess - CT abdomen & pelvis 10/5: Showed features of marked prostatomegaly and findings concerning for acute prostatitis and prosthetic abscesses. - Treating empirically with broad-spectrum IV antibiotics (vancomycin and imipenem). - Urology was consulted/Dr. Lenn Sink and plan transurethral unroofing of prostatic abscess on 10/10, pending cardiology clearance - Blood cultures 2: Negative to date. Unfortunately no urine culture was ever sent from the ER  Active Problems:  Coronary atherosclerosis with elevated troponin/probable NSTEMI - Asymptomatic of chest pain or dyspnea. - Peak troponin of 0.35 and trending down.  - Recent 2-D echo 8/16 showed EF of 50% with mild LVH - Continue statin, ACE inhibitor, beta blocker, Plavix currently on hold for surgery - Cardiology input appreciated: History of multivessel CAD, silent infero-posterior wall MI in the past with occluded 2 coronary vessels i.e. RCA and left circumflex. Initially planned for stress test today but since patient was in dialysis, postponed to 10/9. - Completed stress test this morning. Results pending. Await cardiology recommendations  ESRD on hemodialysis TTS - Nephrology following. Completed hemodialysis 10/8.    Essential hypertension - continue coreg, lisinopril. Controlled.    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 Good inpatient control.  Hyperlipidemia - continue statin   Protein-calorie malnutrition, severe -Placed on nutritional supplements, nutrition consult   Hepatitis C with cirrhosis  Anemia of chronic disease/chronic kidney disease - Stable  Thrombocytopenia - Stable  Adult failure to thrive - As per nephrology input, patient had been seen by palliative care last admit in August and family were not interested in establishing goals of care at that time. Nephrology suggests would benefit from palliative care consult again given many comorbidities, noncompliance with HD (missing and short HD) and overall declining health. However patient planned for acute treatment/surgery on Monday and will defer for future. - Palliative care team did meet with patient and family on 10/8 (see detailed note): At this time patient and family are hopeful to get better and returned home. They did not want to entertain any further discussions pertaining to CODE STATUS or goals of care.    DVT prophylaxis: Subcutaneous heparin Code Status: full code  Family Communication: None at bedside Disposition Plan: Not medically ready    Consultants:  Urology  Cardiology  Nephrology  Palliative care medicine  Procedures:  Hemodialysis  Antibiotics:  IV Rocephin 1 dose on 10/5  IV Zosyn 10/5 > 10/6   IV vancomycin 10/5 >  IV meropenem 10/7 >  Subjective: "I don't feel good and have not been feeling good for 3 days"-does not specify. Denies specific complaints. Denies pain.   Objective: Filed Vitals:   10/19/14 1051 10/19/14 1053 10/19/14 1055 10/19/14 1057  BP: 132/66 147/58 167/66 172/68    Pulse:  70 71 75 75  Temp:      TempSrc:      Resp:      Weight:      SpO2:       temperature 98.75F, RR 18/m and saturating at 100%.   Intake/Output Summary (Last 24 hours) at 10/19/14 1307 Last data filed at 10/19/14 0900  Gross per 24 hour  Intake    120 ml  Output      0 ml  Net    120 ml   Filed Weights   10/18/14 0821 10/18/14 1230 10/18/14 2025  Weight: 60.1 kg (132 lb 7.9 oz) 58.4 kg (128 lb 12 oz) 56 kg (123 lb 7.3 oz)     Exam:  General exam: Moderately built and frail middle-aged male, chronically ill-looking lying comfortably supine in bed. Respiratory system: Clear. No increased work of breathing. Cardiovascular system: S1 & S2 heard, RRR. No JVD, murmurs, gallops, clicks or pedal edema. Nontele Gastrointestinal system: Abdomen is nondistended, soft and nontender. Normal bowel sounds heard. Central nervous system: Alert and oriented. No focal neurological deficits. Extremities: Symmetric 5 x 5 power.    Data Reviewed: Basic Metabolic Panel:  Recent Labs Lab 10/15/14 1427 10/16/14 0520 10/17/14 0553 10/18/14 0555  NA 132* 133* 139 140  K 4.4 3.8 4.4 4.2  CL 91* 91* 94* 98*  CO2 GLUCOSE 126* 98 105* 104*  BUN 24* 27* 13 20  CREATININE 4.67* 5.06* 3.27* 4.14*  CALCIUM 8.3* 8.2* 8.1* 7.2*  MG  --  2.2  --   --   PHOS  --  4.3  --   --    Liver Function Tests:  Recent Labs Lab 10/15/14 1427 10/16/14 0520  AST 27 17  ALT 9* 7*  ALKPHOS 132* 122  BILITOT 0.8 1.1  PROT 6.7 6.3*  ALBUMIN 1.9* 1.8*   No results for input(s): LIPASE, AMYLASE in the last 168 hours. No results for input(s): AMMONIA in the last 168 hours. CBC:  Recent Labs Lab 10/15/14 1427 10/16/14 0520 10/17/14 0553 10/18/14 0555 10/19/14 0655  WBC 18.4* 17.5* 17.9* 16.4* 14.5*  NEUTROABS 16.2*  --   --   --   --   HGB 10.0* 9.9* 10.4* 10.0* 9.8*  HCT 30.8* 30.7* 33.8* 31.0* 31.0*  MCV 86.3 86.2 87.6 87.8 86.6  PLT 105* 115* 142* 148* 151   Cardiac  Enzymes:  Recent Labs Lab 10/15/14 1427 10/15/14 2211 10/15/14 2317 10/16/14 0520 10/16/14 1020  TROPONINI 0.35* 0.24* 0.25* 0.22* 0.19*   BNP (last 3 results) No results for input(s): PROBNP in the last 8760 hours. CBG:  Recent Labs Lab 10/18/14 0730 10/18/14 1347 10/18/14 1620 10/18/14 2019 10/19/14 0723  GLUCAP 108* 111* 113* 128* 113*    Recent Results (from the past 240 hour(s))  Blood culture (routine x 2)     Status: None (Preliminary result)   Collection Time: 10/15/14  7:40 PM  Result Value Ref Range Status   Specimen Description BLOOD RIGHT ARM  Final   Special Requests BOTTLES DRAWN AEROBIC AND ANAEROBIC 5CC  Final   Culture NO GROWTH 4 DAYS  Final   Report Status PENDING  Incomplete  Blood culture (routine x 2)     Status: None (Preliminary result)   Collection Time: 10/15/14  7:52 PM  Result Value Ref Range Status   Specimen Description BLOOD RIGHT HAND  Final   Special Requests BOTTLES DRAWN AEROBIC AND ANAEROBIC 5CC  Final  Culture NO GROWTH 4 DAYS  Final   Report Status PENDING  Incomplete  MRSA PCR Screening     Status: None   Collection Time: 10/15/14  9:49 PM  Result Value Ref Range Status   MRSA by PCR NEGATIVE NEGATIVE Final    Comment:        The GeneXpert MRSA Assay (FDA approved for NASAL specimens only), is one component of a comprehensive MRSA colonization surveillance program. It is not intended to diagnose MRSA infection nor to guide or monitor treatment for MRSA infections.         Studies: No results found.      Scheduled Meds: . antiseptic oral rinse  7 mL Mouth Rinse BID  . atorvastatin  40 mg Oral QHS  . carvedilol  10 mg Oral Daily  . cinacalcet  180 mg Oral Q supper  . darbepoetin (ARANESP) injection - DIALYSIS  60 mcg Intravenous Q Thu-HD  . docusate sodium  100 mg Oral BID  . feeding supplement  1 Container Oral TID BM  . ferric gluconate (FERRLECIT/NULECIT) IV  62.5 mg Intravenous Weekly  . gabapentin   100 mg Oral Daily  . heparin subcutaneous  5,000 Units Subcutaneous 3 times per day  . insulin aspart  0-5 Units Subcutaneous QHS  . insulin aspart  0-9 Units Subcutaneous TID WC  . lanthanum  1,000 mg Oral TID WC  . lisinopril  5 mg Oral Daily  . meropenem (MERREM) IV  500 mg Intravenous Q24H  . multivitamin  1 tablet Oral QHS  . pantoprazole  40 mg Oral BID  . sodium chloride  3 mL Intravenous Q12H  . sodium chloride  3 mL Intravenous Q12H  . vancomycin  500 mg Intravenous Q T,Th,Sa-HD   Continuous Infusions:   Principal Problem:   Prostatitis, acute Active Problems:   Essential hypertension   Coronary atherosclerosis   CVA (cerebral vascular accident) (HCC)   ESRD on hemodialysis (HCC)   Type 2 diabetes mellitus with complication (HCC)   Hyperlipidemia   ESRD (end stage renal disease) (HCC)   Protein-calorie malnutrition, severe (HCC)   Elevated troponin   Lower abdominal pain   Encounter for palliative care    Time spent: 25 minutes.    Marcellus Scott, MD, FACP, FHM. [facility] Pager [phone]  If 7PM-7AM, please contact night-coverage [email or web address] Password [email or web address] 10/19/2014, 1:07 PM    LOS: 4 days

## 2014-10-19 NOTE — Progress Notes (Signed)
Subjective:   Says feels well today- no complaints.   Objective Filed Vitals:   10/18/14 1230 10/18/14 1348 10/18/14 2025 10/19/14 0439  BP: 150/74 146/62 115/54 142/52  Pulse: 73 76 72 67  Temp: 97.9 F (36.6 C) 98.6 F (37 C) 98.6 F (37 C) 98.6 F (37 C)  TempSrc: Oral Axillary    Resp: 18 18 15 18   Weight: 58.4 kg (128 lb 12 oz)  56 kg (123 lb 7.3 oz)   SpO2: 98% 100% 100% 100%   Physical Exam General: chronicially ill appearing no acute distress.  Heart: RRR  Lungs: CTA, shallow, unlabored  Abdomen: soft, nontender +BS  Extremities: no edema Dialysis Access: L AVF patent on HD   Dialysis Orders: TTS [facility] 4 hrs 61.5kgs 2K/2Ca 1900 heparin L AVF venofer 50 q week micera 75 q 2 weeks- last dose given 9/22  Assessment/Plan: 1. Lower abd pain / acute prostatitis with Abscess - urology following. Antibiotics started. Plan for OR Monday for transurethral unroofing of abscess, after Cardiology to clears before surg- stress test pending today 2. ESRD - TTS [facility]- HD next on Tuesday. 3. Hypertension/volume -BPs variable. Continue coreg and lisinprol- 5kgs under edw per bed wts 4. Anemia - hgb 9.8 ESA thursday Cont Fe 5. Metabolic bone disease - Continue sensispar and fosrenol. phos 4.3 and PTH 1043. No vit D with borderline Ca+/ 10 corrected.  6. Coronary atherosclerosis with elevated troponin- no chest pain or shortness of breath/ Cardiology to clear before Urology surg. 7. Nutrition - alb 1.8 poor intake, has been losing wt. Vegetarian diet- K+ 3.8 admit >4.4 today 8. HO CVA / Dementia comorbidities, noncompliance with HD(missing and short HD) and overall declining health. 9. DM type 2 -per admit 10. GOC - seen by palliative care- family remains hopeful. Do no wish for further discussions  Jetty Duhamel, NP [facility] Beeper [phone] 10/19/2014,9:32 AM  LOS: 4 days   Pt seen, examined and agree w A/P as above.  Vinson Moselle MD [facility] pager [phone]    cell [phone] 10/19/2014, 1:37 PM   Additional Objective Labs: Basic Metabolic Panel:  Recent Labs Lab 10/16/14 0520 10/17/14 0553 10/18/14 0555  NA 133* 139 140  K 3.8 4.4 4.2  CL 91* 94* 98*  CO2 27 26 27   GLUCOSE 98 105* 104*  BUN 27* 13 20  CREATININE 5.06* 3.27* 4.14*  CALCIUM 8.2* 8.1* 7.2*  PHOS 4.3  --   --    Liver Function Tests:  Recent Labs Lab 10/15/14 1427 10/16/14 0520  AST 27 17  ALT 9* 7*  ALKPHOS 132* 122  BILITOT 0.8 1.1  PROT 6.7 6.3*  ALBUMIN 1.9* 1.8*   No results for input(s): LIPASE, AMYLASE in the last 168 hours. CBC:  Recent Labs Lab 10/15/14 1427 10/16/14 0520 10/17/14 0553 10/18/14 0555 10/19/14 0655  WBC 18.4* 17.5* 17.9* 16.4* 14.5*  NEUTROABS 16.2*  --   --   --   --   HGB 10.0* 9.9* 10.4* 10.0* 9.8*  HCT 30.8* 30.7* 33.8* 31.0* 31.0*  MCV 86.3 86.2 87.6 87.8 86.6  PLT 105* 115* 142* 148* 151   Blood Culture    Component Value Date/Time   SDES BLOOD RIGHT HAND 10/15/2014 1952   SPECREQUEST BOTTLES DRAWN AEROBIC AND ANAEROBIC 5CC 10/15/2014 1952   CULT NO GROWTH 3 DAYS 10/15/2014 1952   REPTSTATUS PENDING 10/15/2014 1952    Cardiac Enzymes:  Recent Labs Lab 10/15/14 1427 10/15/14 2211 10/15/14 2317  10/16/14 0520 10/16/14 1020  TROPONINI 0.35* 0.24* 0.25* 0.22* 0.19*   CBG:  Recent Labs Lab 10/18/14 0730 10/18/14 1347 10/18/14 1620 10/18/14 2019 10/19/14 0723  GLUCAP 108* 111* 113* 128* 113*   Iron Studies: No results for input(s): IRON, TIBC, TRANSFERRIN, FERRITIN in the last 72 hours. @ Studies/Results: No results found. Medications:   . antiseptic oral rinse  7 mL Mouth Rinse BID  . atorvastatin  40 mg Oral QHS  . carvedilol  10 mg Oral Daily  . cinacalcet  180 mg Oral Q supper  . darbepoetin (ARANESP) injection - DIALYSIS  60 mcg Intravenous Q Thu-HD  . docusate sodium  100 mg Oral BID  . feeding supplement  1 Container Oral TID BM  .  ferric gluconate (FERRLECIT/NULECIT) IV  62.5 mg Intravenous Weekly  . gabapentin  100 mg Oral Daily  . heparin subcutaneous  5,000 Units Subcutaneous 3 times per day  . insulin aspart  0-5 Units Subcutaneous QHS  . insulin aspart  0-9 Units Subcutaneous TID WC  . lanthanum  1,000 mg Oral TID WC  . lisinopril  5 mg Oral Daily  . meropenem (MERREM) IV  500 mg Intravenous Q24H  . multivitamin  1 tablet Oral QHS  . pantoprazole  40 mg Oral BID  . regadenoson      . regadenoson  0.4 mg Intravenous Once  . sodium chloride  3 mL Intravenous Q12H  . sodium chloride  3 mL Intravenous Q12H  . vancomycin  500 mg Intravenous Q T,Th,Sa-HD

## 2014-10-19 NOTE — Progress Notes (Signed)
Subjective:  Patient denies any chest pain or shortness of breath. Tolerated stress portion of the Lexiscan Myoview  Objective:  Vital Signs in the last 24 hours: Temp:  [97.9 F (36.6 C)-98.6 F (37 C)] 98.6 F (37 C) (10/09 0439) Pulse Rate:  [67-76] 75 (10/09 1057) Resp:  [15-18] 18 (10/09 0439) BP: (105-172)/(52-74) 172/68 mmHg (10/09 1057) SpO2:  [98 %-100 %] 100 % (10/09 0439) Weight:  [56 kg (123 lb 7.3 oz)-58.4 kg (128 lb 12 oz)] 56 kg (123 lb 7.3 oz) (10/08 2025)  Intake/Output from previous day: 10/08 0701 - 10/09 0700 In: 120 [P.O.:120] Out: 1705  Intake/Output from this shift:    Physical Exam: Neck: no adenopathy, no carotid bruit, no JVD and supple, symmetrical, trachea midline Lungs: clear to auscultation bilaterally Heart: regular rate and rhythm, S1, S2 normal and Soft systolic murmur noted Abdomen: soft, non-tender; bowel sounds normal; no masses,  no organomegaly Extremities: extremities normal, atraumatic, no cyanosis or edema  Lab Results:  Recent Labs  10/18/14 0555 10/19/14 0655  WBC 16.4* 14.5*  HGB 10.0* 9.8*  PLT 148* 151    Recent Labs  10/17/14 0553 10/18/14 0555  NA 139 140  K 4.4 4.2  CL 94* 98*  CO2 26 27  GLUCOSE 105* 104*  BUN 13 20  CREATININE 3.27* 4.14*   No results for input(s): TROPONINI in the last 72 hours.  Invalid input(s): CK, MB Hepatic Function Panel No results for input(s): PROT, ALBUMIN, AST, ALT, ALKPHOS, BILITOT, BILIDIR, IBILI in the last 72 hours. No results for input(s): CHOL in the last 72 hours. No results for input(s): PROTIME in the last 72 hours.  Imaging: Imaging results have been reviewed and No results found.  Cardiac Studies:  Assessment/Plan:  Probable very small non-Q-wave myocardial infarction. 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 2 coronary vessels, i.e., RCA and left circumflex in the past. Prostatic abscess. Hypertension. Diabetes  mellitus. 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 Hyperlipidemia. Anemia of chronic disease. Plan Continue present management Check nuclear stress test result.   LOS: 4 days    Rinaldo Cloud 10/19/2014, 11:23 AM

## 2014-10-19 NOTE — Progress Notes (Signed)
Called Dr. Sharyn Lull regarding the stress test results, according to him the patient is high risk for the surgery which urology is planning to do on Monday.  Juliane Poot, PA urology made aware about the results and Dr. Annitta Jersey concern.  Juliane Poot gave new orders and also said that they are going to discuss tomorrow morning with the surgeon and go from there.

## 2014-10-20 ENCOUNTER — Encounter (HOSPITAL_COMMUNITY): Payer: Self-pay | Admitting: Certified Registered Nurse Anesthetist

## 2014-10-20 ENCOUNTER — Encounter (HOSPITAL_COMMUNITY)
Admission: EM | Disposition: A | Discharge status: Home Health | Payer: Self-pay | Source: Home / Self Care | Attending: Internal Medicine

## 2014-10-20 LAB — CULTURE, BLOOD (ROUTINE X 2)
Culture: NO GROWTH
Culture: NO GROWTH

## 2014-10-20 LAB — GLUCOSE, CAPILLARY
GLUCOSE-CAPILLARY: 138 mg/dL — AB (ref 65–99)
GLUCOSE-CAPILLARY: 156 mg/dL — AB (ref 65–99)
Glucose-Capillary: 141 mg/dL — ABNORMAL HIGH (ref 65–99)
Glucose-Capillary: 167 mg/dL — ABNORMAL HIGH (ref 65–99)

## 2014-10-20 SURGERY — TURP (TRANSURETHRAL RESECTION OF PROSTATE)
Anesthesia: General

## 2014-10-20 MED ORDER — HEPARIN SODIUM (PORCINE) 5000 UNIT/ML IJ SOLN
5000.0000 [IU] | Freq: Three times a day (TID) | INTRAMUSCULAR | Status: DC
  Administered 2014-10-21: 5000 [IU] via SUBCUTANEOUS
  Filled 2014-10-20: qty 1

## 2014-10-20 NOTE — Progress Notes (Signed)
Pharmacy Antibiotic Time-Out Note  Greg [name] is a 69 y.o. year-old male admitted on 10/15/2014.  The patient is currently on Vancomycin + Zosyn for prostate abscess  Assessment 89 YOM with ESRD who presented on 10/5 with suprapubic abdominal pain for 2 days with imaging that revealed prostatitis and possible prostate abscess. Urology consulted and planning transurethral unroofing of the prostatic abscess on 10/10.   After discussion with Dr. Isidoro Donning on 10/7, Zosyn was changed on 10/7 to Meropenem for empiric coverage based on recent cultures from August showing ESBL E.coli that seeded from an HD cath infection - considered at risk for MDR pathogens. Necessity of Vancomycin was also discussed on 10/7 but the MD wanted Vancomycin to be continued at that time. No stop dates were entered as the plan was for a procedure with urology on 10/10. Today Urology notes that with patient's cardiac status of probable very small non-Q-wave myocardial infarction and EF of 16%, will hold off on surgery.   Plan 1. Continue Meropenem 500 mg q24 hours 2. Continue Vancomycin 500 mg/HD-TTS 3. Will continue to follow HD schedule/duration, culture results, LOT, and antibiotic de-escalation plans. I will check preHD vancomycin level on Thurs 10/11 if vanc continued.   Recent Labs Lab 10/15/14 1427 10/16/14 0520 10/17/14 0553 10/18/14 0555 10/19/14 0655  WBC 18.4* 17.5* 17.9* 16.4* 14.5*     Recent Labs Lab 10/15/14 1427 10/16/14 0520 10/17/14 0553 10/18/14 0555  CREATININE 4.67* 5.06* 3.27* 4.14*   Estimated Creatinine Clearance: 14.2 mL/min (by C-G formula based on Cr of 4.14).   Tmax/24h: Afebrile  Antimicrobial allergies: NKA  Antimicrobials this admission: CTX x 1 10/5 Vanc 10/6 >> Zosyn 10/6 >> 10/7 Mero 10/7 >>  Levels/dose changes this admission: n/a  Microbiology Results: 10/5 BCx  x2 >> Negative/final 10/5 MRSA PCR >> neg  Thank you for allowing pharmacy to be a part of this patient's  care.  Noah Delaine, RPh Clinical Pharmacist Pager: [phone] 10/20/2014 4:38 PM

## 2014-10-20 NOTE — Progress Notes (Signed)
PROGRESS NOTE    Greg [name] VYD:[id number] DOB: 1945-11-01 DOA: 10/15/2014 PCP: Rinaldo Cloud, MD  HPI/Brief narrative 69 y.o. Male with ESRD on hemodialysis TTS, Cirrhosis, Alcohol abuse, Stroke,HTN, DM, Hepatitis, Coronary artery disease, Diabetic foot with dry gangrene (followed by [facility]) and Hypercholesterolemia p/w suprapubic abdominal pain, nausea & vomiting. CT Abdomen showed prostatomegaly, prostatitis & abscess. Urology consulted and plan surgery 10/10 pending Cardiology clearance - had positive troponin's - stress test 10/9. Treated in Aug 2016 for [facility] sepsis d/t HD catheter- removed and completed 2 weeks of Meropenum. Treated with IV antibiotics for acute prostatitis and prostatic abscess. Plan was for surgical intervention pending cardiology clearance. However nuclear stress testing shows LVEF 15% and possible small non-Q-wave MI making patient a very high risk for surgical procedures and hence surgery canceled.   Assessment/Plan:  Principal Problem:  Prostatitis, acute/prostatic abscess - CT abdomen & pelvis 10/5: Showed features of marked prostatomegaly and findings concerning for acute prostatitis and prosthetic abscesses. - Treating empirically with broad-spectrum IV antibiotics (vancomycin and imipenem). - Urology was consulted/Dr. Lenn Sink and initially plan transurethral unroofing of prostatic abscess on 10/10, pending cardiology clearance - Plan was for surgical intervention pending cardiology clearance. However nuclear stress testing shows LVEF 15%, with large scar and possible small non-Q-wave MI making patient a very high risk for surgical procedures and hence surgery canceled. - Blood cultures 2: Negative to date. Unfortunately no urine culture was ever sent from the ER  Active Problems:  Coronary atherosclerosis with elevated troponin/probable NSTEMI - Asymptomatic of chest pain or dyspnea. - Peak troponin of 0.35 and trending down.  - Recent 2-D echo 8/16  showed EF of 50% with mild LVH - Continue statin, ACE inhibitor, beta blocker, Plavix currently on hold for surgery - Cardiology input appreciated: History of multivessel CAD, silent infero-posterior wall MI in the past with occluded 2 coronary vessels i.e. RCA and left circumflex.  - nuclear stress testing shows LVEF 15% and possible small non-Q-wave MI making patient a very high risk for surgical procedures and hence surgery canceled.  ESRD on hemodialysis TTS - Nephrology following. Completed hemodialysis 10/8.   Essential hypertension - continue coreg, lisinopril. Controlled.    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   Type 2 diabetes mellitus with complication, nephropathy  - HbA1c 5.1 , on sliding scale insulin . Good inpatient control.  Hyperlipidemia - continue statin   Protein-calorie malnutrition, severe -Placed on nutritional supplements, nutrition consult   Hepatitis C with cirrhosis  Anemia of chronic disease/chronic kidney disease - Stable  Thrombocytopenia - Stable  Adult failure to thrive - As per nephrology input, patient had been seen by palliative care last admit in August and family were not interested in establishing goals of care at that time. Nephrology suggests would benefit from palliative care consult again given many comorbidities, noncompliance with HD (missing and short HD) and overall declining health. However patient planned for acute treatment/surgery on Monday and will defer for future. - Palliative care team did met with patient and family on 10/8 (see detailed note): At this time patient and family are hopeful to get better and returned home. They did not want to entertain any further discussions pertaining to CODE STATUS or goals of care.  - Patient has extremely poor prognosis. He is not a  candidate for ICD in view of sepsis and multiple comorbidities. Cardiology and  nephrology have recommended palliative care consultation. I discussed in detail with patient's son Mr. Holger Sokolowski on 10/10 and advised him to discuss with the rest of his family to see if they wish to meet with palliative care again to readdress goals of care and he verbalized understanding.   DVT prophylaxis: Subcutaneous heparin Code Status: full code  Family Communication: Discussed with patient's son Mr.Waqaf Loh on 10/10 Disposition Plan: Not medically ready    Consultants:  Urology  Cardiology  Nephrology  Palliative care medicine  Procedures:  Hemodialysis  Antibiotics:  IV Rocephin 1 dose on 10/5  IV Zosyn 10/5 > 10/6   IV vancomycin 10/5 >  IV meropenem 10/7 >  Subjective: "I don't feel good"- seems to be his chronic complaint and does not elaborate. Denies pain or dyspnea.   Objective: Filed Vitals:   10/19/14 1733 10/19/14 2101 10/20/14 0444 10/20/14 1000  BP: 146/56 130/56 121/50 138/55  Pulse: 70 71 57 64  Temp: 98.4 F (36.9 C) 98.1 F (36.7 C) 98 F (36.7 C) 98.6 F (37 C)  TempSrc: Oral   Oral  Resp: $Remo'18 18 16 18  'MXksW$ Weight:  58 kg (127 lb 13.9 oz)    SpO2: 100% 100% 100% 98%    Intake/Output Summary (Last 24 hours) at 10/20/14 1401 Last data filed at 10/20/14 1300  Gross per 24 hour  Intake    490 ml  Output      0 ml  Net    490 ml   Filed Weights   10/18/14 1230 10/18/14 2025 10/19/14 2101  Weight: 58.4 kg (128 lb 12 oz) 56 kg (123 lb 7.3 oz) 58 kg (127 lb 13.9 oz)     Exam:  General exam: Moderately built and frail middle-aged male, chronically ill-looking lying comfortably supine in bed. Respiratory system: Clear. No increased work of breathing. Cardiovascular system: S1 & S2 heard, RRR. No JVD, murmurs, gallops, clicks or pedal edema. Nontele Gastrointestinal system: Abdomen is nondistended, soft and nontender. Normal bowel sounds heard. Central  nervous system: Alert and oriented. No focal neurological deficits. Extremities: Symmetric 5 x 5 power.    Data Reviewed: Basic Metabolic Panel:  Recent Labs Lab 10/15/14 1427 10/16/14 0520 10/17/14 0553 10/18/14 0555  NA 132* 133* 139 140  K 4.4 3.8 4.4 4.2  CL 91* 91* 94* 98*  CO2 $Re'29 27 26 27  'RKx$ GLUCOSE 126* 98 105* 104*  BUN 24* 27* 13 20  CREATININE 4.67* 5.06* 3.27* 4.14*  CALCIUM 8.3* 8.2* 8.1* 7.2*  MG  --  2.2  --   --   PHOS  --  4.3  --   --    Liver Function Tests:  Recent Labs Lab 10/15/14 1427 10/16/14 0520  AST 27 17  ALT 9* 7*  ALKPHOS 132* 122  BILITOT 0.8 1.1  PROT 6.7 6.3*  ALBUMIN 1.9* 1.8*   No results for input(s): LIPASE, AMYLASE in the last 168 hours. No results for input(s): AMMONIA in the last 168 hours. CBC:  Recent Labs Lab 10/15/14 1427 10/16/14 0520 10/17/14 0553 10/18/14 0555 10/19/14 0655  WBC 18.4* 17.5* 17.9* 16.4* 14.5*  NEUTROABS 16.2*  --   --   --   --   HGB 10.0* 9.9* 10.4* 10.0* 9.8*  HCT 30.8* 30.7* 33.8* 31.0* 31.0*  MCV 86.3 86.2 87.6 87.8 86.6  PLT 105* 115* 142* 148* 151   Cardiac Enzymes:  Recent Labs Lab 10/15/14 1427 10/15/14 2211 10/15/14 2317 10/16/14 0520 10/16/14 1020  TROPONINI 0.35* 0.24* 0.25* 0.22* 0.19*   BNP (last 3 results) No results for input(s): PROBNP in the last 8760 hours. CBG:  Recent Labs Lab 10/19/14 1320 10/19/14 1635 10/19/14 2052 10/20/14 0715 10/20/14 1122  GLUCAP 128* 125* 153* 138* 141*    Recent Results (from the past 240 hour(s))  Blood culture (routine x 2)     Status: None   Collection Time: 10/15/14  7:40 PM  Result Value Ref Range Status   Specimen Description BLOOD RIGHT ARM  Final   Special Requests BOTTLES DRAWN AEROBIC AND ANAEROBIC 5CC  Final   Culture NO GROWTH 5 DAYS  Final   Report Status 10/20/2014 FINAL  Final  Blood culture (routine x 2)     Status: None   Collection Time: 10/15/14  7:52 PM  Result Value Ref Range Status   Specimen  Description BLOOD RIGHT HAND  Final   Special Requests BOTTLES DRAWN AEROBIC AND ANAEROBIC 5CC  Final   Culture NO GROWTH 5 DAYS  Final   Report Status 10/20/2014 FINAL  Final  MRSA PCR Screening     Status: None   Collection Time: 10/15/14  9:49 PM  Result Value Ref Range Status   MRSA by PCR NEGATIVE NEGATIVE Final    Comment:        The GeneXpert MRSA Assay (FDA approved for NASAL specimens only), is one component of a comprehensive MRSA colonization surveillance program. It is not intended to diagnose MRSA infection nor to guide or monitor treatment for MRSA infections.         Studies: Nm Myocar Multi W/spect W/wall Motion / Ef  10/19/2014   CLINICAL DATA:  69 year old with prior MI and stroke, current history of diabetes, hypertension and hyperlipidemia, current history of hepatic cirrhosis, current history of end-stage renal disease on hemodialysis, who presented with prostatitis and likely pelvic abscess 4 days ago and was noted to have elevated troponin levels.  EXAM: MYOCARDIAL IMAGING WITH SPECT (REST AND PHARMACOLOGIC-STRESS)  GATED LEFT VENTRICULAR WALL MOTION STUDY  LEFT VENTRICULAR EJECTION FRACTION  TECHNIQUE: Standard myocardial SPECT imaging was performed after resting intravenous injection of 10 mCi Tc-2m sestamibi. Subsequently, intravenous infusion of Lexiscan was performed under the supervision of the Cardiology staff. At peak effect of the drug, 30 mCi Tc-88m sestamibi was injected intravenously and standard myocardial SPECT imaging was performed. Quantitative gated imaging was also performed to evaluate left ventricular wall motion, and estimate left ventricular ejection fraction.  COMPARISON:  None.  FINDINGS: Perfusion: Large perfusion defects involving essentially the entire lateral wall, the entire inferior wall and the majority of the apex on the post regadenoson images without reversibility on the resting images. Relatively normal perfusion to the anterior  wall and septum.  Wall Motion: Severe hypokinesis involving the septum, inferior wall and apex. Mild dyskinesis involving the lateral wall. Relatively well-preserved wall motion involving the anterior wall. Marked left ventricular dilation.  Left Ventricular Ejection Fraction: 33%  End diastolic volume 612 ml  End systolic volume 244 ml  IMPRESSION: 1. Large infarcts involving the lateral wall, inferior wall and apex. No evidence of reversible ischemia.  2. Severe hypokinesis involving the septum, inferior wall and apex. Mild dyskinesis involving the lateral wall. Marked left ventricular dilation.  3. Left ventricular ejection fraction 16%  4. High-risk stress test findings*.  *2012 Appropriate Use Criteria for Coronary Revascularization Focused Update: J Am Coll Cardiol. 9753;00(5):110-211. http://content.airportbarriers.com.aspx?articleid=1201161   Electronically Signed   By: Evangeline Dakin M.D.   On: 10/19/2014  14:10        Scheduled Meds: . antiseptic oral rinse  7 mL Mouth Rinse BID  . atorvastatin  40 mg Oral QHS  . carvedilol  10 mg Oral Daily  . cinacalcet  180 mg Oral Q supper  . darbepoetin (ARANESP) injection - DIALYSIS  60 mcg Intravenous Q Thu-HD  . docusate sodium  100 mg Oral BID  . feeding supplement  1 Container Oral TID BM  . ferric gluconate (FERRLECIT/NULECIT) IV  62.5 mg Intravenous Weekly  . gabapentin  100 mg Oral Daily  . heparin subcutaneous  5,000 Units Subcutaneous 3 times per day  . insulin aspart  0-5 Units Subcutaneous QHS  . insulin aspart  0-9 Units Subcutaneous TID WC  . lanthanum  1,000 mg Oral TID WC  . lisinopril  5 mg Oral Daily  . meropenem (MERREM) IV  500 mg Intravenous Q24H  . multivitamin  1 tablet Oral QHS  . pantoprazole  40 mg Oral BID  . sodium chloride  3 mL Intravenous Q12H  . sodium chloride  3 mL Intravenous Q12H  . vancomycin  500 mg Intravenous Q T,Th,Sa-HD   Continuous Infusions: . dextrose 5 % and 0.45% NaCl      Principal  Problem:   Prostatitis, acute Active Problems:   Essential hypertension   Coronary atherosclerosis   CVA (cerebral vascular accident) ([place])   ESRD on hemodialysis ([place])   Type 2 diabetes mellitus with complication ([place])   Hyperlipidemia   ESRD (end stage renal disease) ([place])   Protein-calorie malnutrition, severe ([place])   Elevated troponin   Lower abdominal pain   Encounter for palliative care    Time spent: 25 minutes.    Vernell Leep, MD, FACP, FHM. [facility] Pager [phone]  If 7PM-7AM, please contact night-coverage [email or web address] Password [email or web address] 10/20/2014, 2:01 PM    LOS: 5 days

## 2014-10-20 NOTE — Progress Notes (Signed)
From Dr Annitta Jersey note, he seems to think that pt has experienced an MI. That would preclude anesthetic for this non-emergent but important procedure. I will speak with Dr Sharyn Lull today about this, but am leery of proceeding w/ TUR unroofing of abscess. Only other viable option is to continue abx management. Question if IR could access this area for percutaneous drain. I have temporarily held heparin in case we would proceed today.

## 2014-10-20 NOTE — Progress Notes (Signed)
I've spoken with Dr Sharyn Lull. With pt's cardiac status and EF of 16%, will hold off on surgery.

## 2014-10-20 NOTE — Progress Notes (Signed)
Subjective:  Patient denies any chest pain or shortness of breath. Complains of generalized weakness. Nuclear stress test showed large area of scarring in inferolateral and apical wall with markedly depressed ejection fraction of 16%. Discussed with urology due to possible very small non-Q-wave MI and markedly depressed LV systolic function with EF of 16% and markedly dilated LV and multiple comorbidities patient is very high risk for any surgical procedure agrees for medical management for now unless absolutely necessary.  Objective:  Vital Signs in the last 24 hours: Temp:  [98 F (36.7 C)-98.6 F (37 C)] 98.6 F (37 C) (10/10 1000) Pulse Rate:  [57-71] 64 (10/10 1000) Resp:  [16-18] 18 (10/10 1000) BP: (121-146)/(50-56) 138/55 mmHg (10/10 1000) SpO2:  [98 %-100 %] 98 % (10/10 1000) Weight:  [58 kg (127 lb 13.9 oz)] 58 kg (127 lb 13.9 oz) (10/09 2101)  Intake/Output from previous day: 10/09 0701 - 10/10 0700 In: 460 [P.O.:360; IV Piggyback:100] Out: -  Intake/Output from this shift:    Physical Exam: Neck: no adenopathy, no carotid bruit, no JVD and supple, symmetrical, trachea midline Lungs: Decreased breath sound at bases clear anteriorly Heart: regular rate and rhythm, S1, S2 normal and Soft systolic murmur noted no pericardial rub Abdomen: soft, non-tender; bowel sounds normal; no masses,  no organomegaly Extremities: extremities normal, atraumatic, no cyanosis or edema  Lab Results:  Recent Labs  10/18/14 0555 10/19/14 0655  WBC 16.4* 14.5*  HGB 10.0* 9.8*  PLT 148* 151    Recent Labs  10/18/14 0555  NA 140  K 4.2  CL 98*  CO2 27  GLUCOSE 104*  BUN 20  CREATININE 4.14*   No results for input(s): TROPONINI in the last 72 hours.  Invalid input(s): CK, MB Hepatic Function Panel No results for input(s): PROT, ALBUMIN, AST, ALT, ALKPHOS, BILITOT, BILIDIR, IBILI in the last 72 hours. No results for input(s): CHOL in the last 72 hours. No results for input(s):  PROTIME in the last 72 hours.  Imaging: Imaging results have been reviewed and Nm Myocar Multi W/spect W/wall Motion / Ef  10/19/2014   CLINICAL DATA:  69 year old with prior MI and stroke, current history of diabetes, hypertension and hyperlipidemia, current history of hepatic cirrhosis, current history of end-stage renal disease on hemodialysis, who presented with prostatitis and likely pelvic abscess 4 days ago and was noted to have elevated troponin levels.  EXAM: MYOCARDIAL IMAGING WITH SPECT (REST AND PHARMACOLOGIC-STRESS)  GATED LEFT VENTRICULAR WALL MOTION STUDY  LEFT VENTRICULAR EJECTION FRACTION  TECHNIQUE: Standard myocardial SPECT imaging was performed after resting intravenous injection of 10 mCi Tc-83m sestamibi. Subsequently, intravenous infusion of Lexiscan was performed under the supervision of the Cardiology staff. At peak effect of the drug, 30 mCi Tc-79m sestamibi was injected intravenously and standard myocardial SPECT imaging was performed. Quantitative gated imaging was also performed to evaluate left ventricular wall motion, and estimate left ventricular ejection fraction.  COMPARISON:  None.  FINDINGS: Perfusion: Large perfusion defects involving essentially the entire lateral wall, the entire inferior wall and the majority of the apex on the post regadenoson images without reversibility on the resting images. Relatively normal perfusion to the anterior wall and septum.  Wall Motion: Severe hypokinesis involving the septum, inferior wall and apex. Mild dyskinesis involving the lateral wall. Relatively well-preserved wall motion involving the anterior wall. Marked left ventricular dilation.  Left Ventricular Ejection Fraction: 16%  End diastolic volume 252 ml  End systolic volume 212 ml  IMPRESSION: 1. Large infarcts involving  the lateral wall, inferior wall and apex. No evidence of reversible ischemia.  2. Severe hypokinesis involving the septum, inferior wall and apex. Mild dyskinesis  involving the lateral wall. Marked left ventricular dilation.  3. Left ventricular ejection fraction 16%  4. High-risk stress test findings*.  *2012 Appropriate Use Criteria for Coronary Revascularization Focused Update: J Am Coll Cardiol. 2012;59(9):857-881. http://content.[email or web address].aspx?articleid=1201161   Electronically Signed   By: Hulan Saas M.D.   On: 10/19/2014 14:10    Cardiac Studies:  Assessment/Plan:  Probable very small non-Q-wave myocardial infarction. 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 2 coronary vessels, i.e., RCA and left circumflex in the past. Prostatic abscess. Hypertension. Diabetes mellitus. 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 Hyperlipidemia. Anemia of chronic disease. Plan Continue present management Long-term prognosis poor Patient is not a candidate for ICD in view of sepsis and multiple comorbidities Agree with palliative care  LOS: 5 days    Rinaldo Cloud 10/20/2014, 11:23 AM

## 2014-10-20 NOTE — Progress Notes (Signed)
Subjective:  Awoken from sleep , no cos, family in room asleep, noted Urology surgery held secondary to Cardiac risk noted  Objective Vital signs in last 24 hours: Filed Vitals:   10/19/14 1057 10/19/14 1733 10/19/14 2101 10/20/14 0444  BP: 172/68 146/56 130/56 121/50  Pulse: 75 70 71 57  Temp:  98.4 F (36.9 C) 98.1 F (36.7 C) 98 F (36.7 C)  TempSrc:  Oral    Resp:  18 18 16   Weight:   58 kg (127 lb 13.9 oz)   SpO2:  100% 100% 100%   Weight change: -2.1 kg (-4 lb 10.1 oz)  Physical Exam: General: alert after awoken from sleep /chronicially ill appearance pleasantly confused this am but NAD  Heart: RRR soft !/6 sem lsb, no rub or gallop Lungs: CTA bilat/ unlabored  Abdomen: soft, nontender/ ND/ +BS  Extremities: no pedal edema/ dry iscemic skin changes bilat feet Dialysis Access: L AVF + bruit   Dialysis Orders: TTS [facility] 4 hrs 61.5kgs 2K/2Ca 1900 heparin L AVF venofer 50 q week micera 75 q 2 weeks- last dose given 9/22  Problem/Plan: 1. Lower abd pain / acute prostatitis with Abscess -  ON Antibiotics/  Plan for Urology OR canceled sec to Cardiac risk with pos stress test/ noted IR to eval  Per Urology notes 2. ESRD - TTS [facility]- HD next on Tuesday. 3. Hypertension/volume -BPs stabilizing past 48 hrs with Sat hd / am bp 121/50. Continue coreg and lisinprol- 3kgs under edw per bed wts 4. Anemia - hgb 9.8 ESA q  Thursday HD Cont Fe 5. Metabolic bone disease - Continue sensispar and fosrenol. phos 4.3 and PTH 1043. No vit D with borderline Ca+/ 9.2 corrected.  6. Coronary atherosclerosis with elevated troponin- no chest pain or shortness of breath/ Cardiology Stress testing pos . Urology sur held  7. Nutrition - alb 1.8 poor intake, has been losing wt. Vegetarian diet- K+ 3.8 admit >4.2 8. HO CVA / Dementia- MS waxes and wanes 9. DM type 2 -per admit 10. GOC - seen by palliative care- family remains hopeful and do not  wish for further discussions about  stopping HD/ Current family in room asleep does not wake and no questions for me.   Lenny Pastel, PA-C [facility] Bee[facility] [phone] 10/20/2014,7:59 AM  LOS: 5 days   Labs: Basic Metabolic Panel:  Recent Labs Lab 10/16/14 0520 10/17/14 0553 10/18/14 0555  NA 133* 139 140  K 3.8 4.4 4.2  CL 91* 94* 98*  CO2 27 26 27   GLUCOSE 98 105* 104*  BUN 27* 13 20  CREATININE 5.06* 3.27* 4.14*  CALCIUM 8.2* 8.1* 7.2*  PHOS 4.3  --   --    Liver Function Tests:  Recent Labs Lab 10/15/14 1427 10/16/14 0520  AST 27 17  ALT 9* 7*  ALKPHOS 132* 122  BILITOT 0.8 1.1  PROT 6.7 6.3*  ALBUMIN 1.9* 1.8*   No results for input(s): LIPASE, AMYLASE in the last 168 hours. No results for input(s): AMMONIA in the last 168 hours. CBC:  Recent Labs Lab 10/15/14 1427 10/16/14 0520 10/17/14 0553 10/18/14 0555 10/19/14 0655  WBC 18.4* 17.5* 17.9* 16.4* 14.5*  NEUTROABS 16.2*  --   --   --   --   HGB 10.0* 9.9* 10.4* 10.0* 9.8*  HCT 30.8* 30.7* 33.8* 31.0* 31.0*  MCV 86.3 86.2 87.6 87.8 86.6  PLT 105* 115* 142* 148* 151   Cardiac Enzymes:  Recent Labs Lab 10/15/14 1427  10/15/14 2211 10/15/14 2317 10/16/14 0520 10/16/14 1020  TROPONINI 0.35* 0.24* 0.25* 0.22* 0.19*   CBG:  Recent Labs Lab 10/19/14 0723 10/19/14 1320 10/19/14 1635 10/19/14 2052 10/20/14 0715  GLUCAP 113* 128* 125* 153* 138*    Studies/Results: Nm Myocar Multi W/spect W/wall Motion / Ef  10/19/2014   CLINICAL DATA:  69 year old with prior MI and stroke, current history of diabetes, hypertension and hyperlipidemia, current history of hepatic cirrhosis, current history of end-stage renal disease on hemodialysis, who presented with prostatitis and likely pelvic abscess 4 days ago and was noted to have elevated troponin levels.  EXAM: MYOCARDIAL IMAGING WITH SPECT (REST AND PHARMACOLOGIC-STRESS)  GATED LEFT VENTRICULAR WALL MOTION STUDY  LEFT VENTRICULAR EJECTION FRACTION  TECHNIQUE: Standard  myocardial SPECT imaging was performed after resting intravenous injection of 10 mCi Tc-33m sestamibi. Subsequently, intravenous infusion of Lexiscan was performed under the supervision of the Cardiology staff. At peak effect of the drug, 30 mCi Tc-25m sestamibi was injected intravenously and standard myocardial SPECT imaging was performed. Quantitative gated imaging was also performed to evaluate left ventricular wall motion, and estimate left ventricular ejection fraction.  COMPARISON:  None.  FINDINGS: Perfusion: Large perfusion defects involving essentially the entire lateral wall, the entire inferior wall and the majority of the apex on the post regadenoson images without reversibility on the resting images. Relatively normal perfusion to the anterior wall and septum.  Wall Motion: Severe hypokinesis involving the septum, inferior wall and apex. Mild dyskinesis involving the lateral wall. Relatively well-preserved wall motion involving the anterior wall. Marked left ventricular dilation.  Left Ventricular Ejection Fraction: 16%  End diastolic volume 252 ml  End systolic volume 212 ml  IMPRESSION: 1. Large infarcts involving the lateral wall, inferior wall and apex. No evidence of reversible ischemia.  2. Severe hypokinesis involving the septum, inferior wall and apex. Mild dyskinesis involving the lateral wall. Marked left ventricular dilation.  3. Left ventricular ejection fraction 16%  4. High-risk stress test findings*.  *2012 Appropriate Use Criteria for Coronary Revascularization Focused Update: J Am Coll Cardiol. 2012;59(9):857-881. http://content.dementiazones.com.aspx?articleid=1201161   Electronically Signed   By: Hulan Saas M.D.   On: 10/19/2014 14:10   Medications: . dextrose 5 % and 0.45% NaCl     . antiseptic oral rinse  7 mL Mouth Rinse BID  . atorvastatin  40 mg Oral QHS  . carvedilol  10 mg Oral Daily  . cinacalcet  180 mg Oral Q supper  . darbepoetin (ARANESP) injection -  DIALYSIS  60 mcg Intravenous Q Thu-HD  . docusate sodium  100 mg Oral BID  . feeding supplement  1 Container Oral TID BM  . ferric gluconate (FERRLECIT/NULECIT) IV  62.5 mg Intravenous Weekly  . gabapentin  100 mg Oral Daily  . insulin aspart  0-5 Units Subcutaneous QHS  . insulin aspart  0-9 Units Subcutaneous TID WC  . lanthanum  1,000 mg Oral TID WC  . lisinopril  5 mg Oral Daily  . meropenem (MERREM) IV  500 mg Intravenous Q24H  . multivitamin  1 tablet Oral QHS  . pantoprazole  40 mg Oral BID  . sodium chloride  3 mL Intravenous Q12H  . sodium chloride  3 mL Intravenous Q12H  . vancomycin  500 mg Intravenous Q T,Th,Sa-HD

## 2014-10-20 NOTE — Progress Notes (Signed)
Day of Surgery Subjective: Patient still with suprapubic/perineal discomfort. Overall clinical status seems to be declining. Surgery was canceled due to high cardiac risk.  Objective: Vital signs in last 24 hours: Temp:  [98 F (36.7 C)-98.6 F (37 C)] 98.6 F (37 C) (10/10 1712) Pulse Rate:  [57-71] 68 (10/10 1712) Resp:  [16-18] 17 (10/10 1712) BP: (121-146)/(42-56) 133/42 mmHg (10/10 1712) SpO2:  [98 %-100 %] 100 % (10/10 1712) Weight:  [58 kg (127 lb 13.9 oz)] 58 kg (127 lb 13.9 oz) (10/09 2101)  Intake/Output from previous day: 10/09 0701 - 10/10 0700 In: 460 [P.O.:360; IV Piggyback:100] Out: -  Intake/Output this shift: Total I/O In: 30 [P.O.:30] Out: -   Physical Exam:  Constitutional: Vital signs reviewed. Lethargic   Pulmonary/Chest: Normal effort    Lab Results:  Recent Labs  10/18/14 0555 10/19/14 0655  HGB 10.0* 9.8*  HCT 31.0* 31.0*   BMET  Recent Labs  10/18/14 0555  NA 140  K 4.2  CL 98*  CO2 27  GLUCOSE 104*  BUN 20  CREATININE 4.14*  CALCIUM 7.2*   No results for input(s): LABPT, INR in the last 72 hours. No results for input(s): LABURIN in the last 72 hours. Results for orders placed or performed during the hospital encounter of 10/15/14  Blood culture (routine x 2)     Status: None   Collection Time: 10/15/14  7:40 PM  Result Value Ref Range Status   Specimen Description BLOOD RIGHT ARM  Final   Special Requests BOTTLES DRAWN AEROBIC AND ANAEROBIC 5CC  Final   Culture NO GROWTH 5 DAYS  Final   Report Status 10/20/2014 FINAL  Final  Blood culture (routine x 2)     Status: None   Collection Time: 10/15/14  7:52 PM  Result Value Ref Range Status   Specimen Description BLOOD RIGHT HAND  Final   Special Requests BOTTLES DRAWN AEROBIC AND ANAEROBIC 5CC  Final   Culture NO GROWTH 5 DAYS  Final   Report Status 10/20/2014 FINAL  Final  MRSA PCR Screening     Status: None   Collection Time: 10/15/14  9:49 PM  Result Value Ref Range  Status   MRSA by PCR NEGATIVE NEGATIVE Final    Comment:        The GeneXpert MRSA Assay (FDA approved for NASAL specimens only), is one component of a comprehensive MRSA colonization surveillance program. It is not intended to diagnose MRSA infection nor to guide or monitor treatment for MRSA infections.     Studies/Results: Nm Myocar Multi W/spect W/wall Motion / Ef  10/19/2014   CLINICAL DATA:  69 year old with prior MI and stroke, current history of diabetes, hypertension and hyperlipidemia, current history of hepatic cirrhosis, current history of end-stage renal disease on hemodialysis, who presented with prostatitis and likely pelvic abscess 4 days ago and was noted to have elevated troponin levels.  EXAM: MYOCARDIAL IMAGING WITH SPECT (REST AND PHARMACOLOGIC-STRESS)  GATED LEFT VENTRICULAR WALL MOTION STUDY  LEFT VENTRICULAR EJECTION FRACTION  TECHNIQUE: Standard myocardial SPECT imaging was performed after resting intravenous injection of 10 mCi Tc-43m sestamibi. Subsequently, intravenous infusion of Lexiscan was performed under the supervision of the Cardiology staff. At peak effect of the drug, 30 mCi Tc-33m sestamibi was injected intravenously and standard myocardial SPECT imaging was performed. Quantitative gated imaging was also performed to evaluate left ventricular wall motion, and estimate left ventricular ejection fraction.  COMPARISON:  None.  FINDINGS: Perfusion: Large perfusion defects involving essentially the  entire lateral wall, the entire inferior wall and the majority of the apex on the post regadenoson images without reversibility on the resting images. Relatively normal perfusion to the anterior wall and septum.  Wall Motion: Severe hypokinesis involving the septum, inferior wall and apex. Mild dyskinesis involving the lateral wall. Relatively well-preserved wall motion involving the anterior wall. Marked left ventricular dilation.  Left Ventricular Ejection Fraction: 16%   End diastolic volume 252 ml  End systolic volume 212 ml  IMPRESSION: 1. Large infarcts involving the lateral wall, inferior wall and apex. No evidence of reversible ischemia.  2. Severe hypokinesis involving the septum, inferior wall and apex. Mild dyskinesis involving the lateral wall. Marked left ventricular dilation.  3. Left ventricular ejection fraction 16%  4. High-risk stress test findings*.  *2012 Appropriate Use Criteria for Coronary Revascularization Focused Update: J Am Coll Cardiol. 2012;59(9):857-881. http://content.dementiazones.com.aspx?articleid=1201161   Electronically Signed   By: Hulan Saas M.D.   On: 10/19/2014 14:10    Assessment/Plan:   Patient with prostatic abscess-scheduled for transurethral unroofing today-this was canceled due to the patient being at high risk for anesthetic procedure. I have discussed this cancellation with the patient and his family. I think the only effective management now with just be to continue long-term antibiotic management. White blood cell count seems to be decreasing, so I think antibiotic management has been successful so far.    I will continue to follow with you-we will make sure that his bladder is appropriately emptied on a daily basis.   LOS: 5 days   Marcine Matar M 10/20/2014, 5:25 PM

## 2014-10-21 DIAGNOSIS — Z794 Long term (current) use of insulin: Secondary | ICD-10-CM

## 2014-10-21 LAB — RENAL FUNCTION PANEL
ALBUMIN: 1.3 g/dL — AB (ref 3.5–5.0)
ANION GAP: 12 (ref 5–15)
BUN: 33 mg/dL — ABNORMAL HIGH (ref 6–20)
CALCIUM: 6.2 mg/dL — AB (ref 8.9–10.3)
CO2: 22 mmol/L (ref 22–32)
Chloride: 96 mmol/L — ABNORMAL LOW (ref 101–111)
Creatinine, Ser: 5.34 mg/dL — ABNORMAL HIGH (ref 0.61–1.24)
GFR calc non Af Amer: 10 mL/min — ABNORMAL LOW (ref >60)
GFR, EST AFRICAN AMERICAN: 12 mL/min — AB (ref >60)
Glucose, Bld: 185 mg/dL — ABNORMAL HIGH (ref 65–99)
PHOSPHORUS: 3.1 mg/dL (ref 2.5–4.6)
POTASSIUM: 4.3 mmol/L (ref 3.5–5.1)
SODIUM: 130 mmol/L — AB (ref 135–145)

## 2014-10-21 LAB — CBC
HEMATOCRIT: 28.5 % — AB (ref 39.0–52.0)
HEMOGLOBIN: 9.2 g/dL — AB (ref 13.0–17.0)
MCH: 27.2 pg (ref 26.0–34.0)
MCHC: 32.3 g/dL (ref 30.0–36.0)
MCV: 84.3 fL (ref 78.0–100.0)
Platelets: 135 10*3/uL — ABNORMAL LOW (ref 150–400)
RBC: 3.38 MIL/uL — AB (ref 4.22–5.81)
RDW: 18.9 % — ABNORMAL HIGH (ref 11.5–15.5)
WBC: 13.4 10*3/uL — ABNORMAL HIGH (ref 4.0–10.5)

## 2014-10-21 LAB — GLUCOSE, CAPILLARY: GLUCOSE-CAPILLARY: 122 mg/dL — AB (ref 65–99)

## 2014-10-21 MED ORDER — BOOST / RESOURCE BREEZE PO LIQD: 1.0000 | Freq: Three times a day (TID) | ORAL | Status: AC

## 2014-10-21 MED ORDER — CEFAZOLIN SODIUM-DEXTROSE 2-3 GM-% IV SOLR: 2.0000 g | INTRAVENOUS | Status: DC

## 2014-10-21 MED ORDER — PANTOPRAZOLE SODIUM 40 MG PO TBEC: 40.0000 mg | DELAYED_RELEASE_TABLET | Freq: Two times a day (BID) | ORAL | Status: AC

## 2014-10-21 MED ORDER — CARVEDILOL PHOSPHATE ER 10 MG PO CP24: 10.0000 mg | ORAL_CAPSULE | Freq: Every day | ORAL | Status: AC

## 2014-10-21 MED ORDER — RENA-VITE PO TABS: 1.0000 | ORAL_TABLET | Freq: Every day | ORAL | Status: AC

## 2014-10-21 MED ORDER — CEFAZOLIN SODIUM-DEXTROSE 2-3 GM-% IV SOLR
2.0000 g | INTRAVENOUS | Status: DC
  Administered 2014-10-21: 2 g via INTRAVENOUS
  Filled 2014-10-21: qty 50

## 2014-10-21 MED ORDER — CINACALCET HCL 30 MG PO TABS: 180.0000 mg | ORAL_TABLET | Freq: Every day | ORAL | Status: AC

## 2014-10-21 MED ORDER — ACETAMINOPHEN 325 MG PO TABS: 650.0000 mg | ORAL_TABLET | Freq: Four times a day (QID) | ORAL | Status: AC | PRN

## 2014-10-21 MED ORDER — OXYCODONE-ACETAMINOPHEN 5-325 MG PO TABS: 1.0000 | ORAL_TABLET | Freq: Three times a day (TID) | ORAL | Status: AC | PRN

## 2014-10-21 NOTE — Discharge Instructions (Signed)
Prostatitis The prostate gland is about the size and shape of a walnut. It is located just below your bladder. It produces one of the components of semen, which is made up of sperm and the fluids that help nourish and transport it out from the testicles. Prostatitis is inflammation of the prostate gland.  There are four types of prostatitis:  Acute bacterial prostatitis. This is the least common type of prostatitis. It starts quickly and usually is associated with a bladder infection, high fever, and shaking chills. It can occur at any age.  Chronic bacterial prostatitis. This is a persistent bacterial infection in the prostate. It usually develops from repeated acute bacterial prostatitis or acute bacterial prostatitis that was not properly treated. It can occur in men of any age but is most common in middle-aged men whose prostate has begun to enlarge. The symptoms are not as severe as those in acute bacterial prostatitis. Discomfort in the part of your body that is in front of your rectum and below your scrotum (perineum), lower abdomen, or in the head of your penis (glans) may represent your primary discomfort.  Chronic prostatitis (nonbacterial). This is the most common type of prostatitis. It is inflammation of the prostate gland that is not caused by a bacterial infection. The cause is unknown and may be associated with a viral infection or autoimmune disorder.  Prostatodynia (pelvic floor disorder). This is associated with increased muscular tone in the pelvis surrounding the prostate. CAUSES The causes of bacterial prostatitis are bacterial infection. The causes of the other types of prostatitis are unknown.  SYMPTOMS  Symptoms can vary depending upon the type of prostatitis that exists. There can also be overlap in symptoms. Possible symptoms for each type of prostatitis are listed below. Acute Bacterial Prostatitis  Painful urination.  Fever or chills.  Muscle or joint pains.  Low  back pain.  Low abdominal pain.  Inability to empty bladder completely. Chronic Bacterial Prostatitis, Chronic Nonbacterial Prostatitis, and Prostatodynia  Sudden urge to urinate.  Frequent urination.  Difficulty starting urine stream.  Weak urine stream.  Discharge from the urethra.  Dribbling after urination.  Rectal pain.  Pain in the testicles, penis, or tip of the penis.  Pain in the perineum.  Problems with sexual function.  Painful ejaculation.  Bloody semen. DIAGNOSIS  In order to diagnose prostatitis, your health care provider will ask about your symptoms. One or more urine samples will be taken and tested (urinalysis). If the urinalysis result is negative for bacteria, your health care provider may use a finger to feel your prostate (digital rectal exam). This exam helps your health care provider determine if your prostate is swollen and tender. It will also produce a specimen of semen that can be analyzed. TREATMENT  Treatment for prostatitis depends on the cause. If a bacterial infection is the cause, it can be treated with antibiotic medicine. In cases of chronic bacterial prostatitis, the use of antibiotics for up to 1 month or 6 weeks may be necessary. Your health care provider may instruct you to take sitz baths to help relieve pain. A sitz bath is a bath of hot water in which your hips and buttocks are under water. This relaxes the pelvic floor muscles and often helps to relieve the pressure on your prostate. HOME CARE INSTRUCTIONS   Take all medicines as directed by your health care provider.  Take sitz baths as directed by your health care provider. SEEK MEDICAL CARE IF:   Your symptoms  get worse, not better.  You have a fever. SEEK IMMEDIATE MEDICAL CARE IF:   You have chills.  You feel nauseous or vomit.  You feel lightheaded or faint.  You are unable to urinate.  You have blood or blood clots in your urine. MAKE SURE YOU:  Understand  these instructions.  Will watch your condition.  Will get help right away if you are not doing well or get worse.   This information is not intended to replace advice given to you by your health care provider. Make sure you discuss any questions you have with your health care provider.   Document Released: 12/25/1999 Document Revised: 01/17/2014 Document Reviewed: 07/16/2012 Elsevier Interactive Patient Education 2016 [employer]  Abscess An abscess is an infected area that contains a collection of pus and debris.It can occur in almost any part of the body. An abscess is also known as a furuncle or boil. CAUSES  An abscess occurs when tissue gets infected. This can occur from blockage of oil or sweat glands, infection of hair follicles, or a minor injury to the skin. As the body tries to fight the infection, pus collects in the area and creates pressure under the skin. This pressure causes pain. People with weakened immune systems have difficulty fighting infections and get certain abscesses more often.  SYMPTOMS Usually an abscess develops on the skin and becomes a painful mass that is red, warm, and tender. If the abscess forms under the skin, you may feel a moveable soft area under the skin. Some abscesses break open (rupture) on their own, but most will continue to get worse without care. The infection can spread deeper into the body and eventually into the bloodstream, causing you to feel ill.  DIAGNOSIS  Your caregiver will take your medical history and perform a physical exam. A sample of fluid may also be taken from the abscess to determine what is causing your infection. TREATMENT  Your caregiver may prescribe antibiotic medicines to fight the infection. However, taking antibiotics alone usually does not cure an abscess. Your caregiver may need to make a small cut (incision) in the abscess to drain the pus. In some cases, gauze is packed into the abscess to reduce pain and to  continue draining the area. HOME CARE INSTRUCTIONS   Only take over-the-counter or prescription medicines for pain, discomfort, or fever as directed by your caregiver.  If you were prescribed antibiotics, take them as directed. Finish them even if you start to feel better.  If gauze is used, follow your caregiver's directions for changing the gauze.  To avoid spreading the infection:  Keep your draining abscess covered with a bandage.  Wash your hands well.  Do not share personal care items, towels, or whirlpools with others.  Avoid skin contact with others.  Keep your skin and clothes clean around the abscess.  Keep all follow-up appointments as directed by your caregiver. SEEK MEDICAL CARE IF:   You have increased pain, swelling, redness, fluid drainage, or bleeding.  You have muscle aches, chills, or a general ill feeling.  You have a fever. MAKE SURE YOU:   Understand these instructions.  Will watch your condition.  Will get help right away if you are not doing well or get worse.   This information is not intended to replace advice given to you by your health care provider. Make sure you discuss any questions you have with your health care provider.   Document Released: 10/06/2004 Document Revised: 06/28/2011  Document Reviewed: 03/11/2011 Elsevier Interactive Patient Education [employer]

## 2014-10-21 NOTE — Progress Notes (Signed)
CRITICAL VALUE ALERT  Critical value received:  Ca 6.2  Date of notification:  10/21/14  Time of notification:  0826  Critical value read back: yes  Nurse who received alert:  Joy Jessup/Donnielle Addison Lorette Ang  MD notified (1st page):  Marisue Humble  Time of first page:  0826  MD notified (2nd page):  Time of second page:  Responding MD:  Marisue Humble  Time MD responded:  [phone] Md was at nurse station

## 2014-10-21 NOTE — Procedures (Signed)
I was present at this dialysis session. I have reviewed the session itself and made appropriate changes.   Cardiology, Urology, IM notes reviewed.    Under outpt EDW, set UF goal of 3L today, lkikely losing weight.  On IVF at 90mL/hr, will stop -- perhaps ordered as preop fluids.    Hb 9.2, on ESA Aranesp 60 qThurs; also infected; monitor.    Agree with overall very poor prognosis and challenges effectively communicating this with family.    Sabra Heck  MD 10/21/2014, 8:08 AM

## 2014-10-21 NOTE — Progress Notes (Signed)
Subjective:  Patient denies any chest pain or shortness of breath. Denies abdominal pain fever or chills.. Seen during hemodialysis.  Objective:  Vital Signs in the last 24 hours: Temp:  [97.6 F (36.4 C)-98.6 F (37 C)] 97.6 F (36.4 C) (10/11 0728) Pulse Rate:  [66-74] 74 (10/11 0930) Resp:  [17-20] 20 (10/11 0728) BP: (103-137)/(5-61) 103/60 mmHg (10/11 0930) SpO2:  [100 %] 100 % (10/11 0728) Weight:  [60.5 kg (133 lb 6.1 oz)] 60.5 kg (133 lb 6.1 oz) (10/10 2100)  Intake/Output from previous day: 10/10 0701 - 10/11 0700 In: 67.5 [P.O.:30; I.V.:37.5] Out: 0  Intake/Output from this shift:    Physical Exam: Neck: no adenopathy, no carotid bruit, no JVD and supple, symmetrical, trachea midline Lungs: Clear anterolaterally Heart: regular rate and rhythm, S1, S2 normal and Soft systolic murmur noted Abdomen: soft, non-tender; bowel sounds normal; no masses,  no organomegaly Extremities: extremities normal, atraumatic, no cyanosis or edema  Lab Results:  Recent Labs  10/19/14 0655 10/21/14 0753  WBC 14.5* 13.4*  HGB 9.8* 9.2*  PLT 151 135*    Recent Labs  10/21/14 0752  NA 130*  K 4.3  CL 96*  CO2 22  GLUCOSE 185*  BUN 33*  CREATININE 5.34*   No results for input(s): TROPONINI in the last 72 hours.  Invalid input(s): CK, MB Hepatic Function Panel  Recent Labs  10/21/14 0752  ALBUMIN 1.3*   No results for input(s): CHOL in the last 72 hours. No results for input(s): PROTIME in the last 72 hours.  Imaging: Imaging results have been reviewed and Nm Myocar Multi W/spect W/wall Motion / Ef  10/19/2014   CLINICAL DATA:  69 year old with prior MI and stroke, current history of diabetes, hypertension and hyperlipidemia, current history of hepatic cirrhosis, current history of end-stage renal disease on hemodialysis, who presented with prostatitis and likely pelvic abscess 4 days ago and was noted to have elevated troponin levels.  EXAM: MYOCARDIAL IMAGING WITH  SPECT (REST AND PHARMACOLOGIC-STRESS)  GATED LEFT VENTRICULAR WALL MOTION STUDY  LEFT VENTRICULAR EJECTION FRACTION  TECHNIQUE: Standard myocardial SPECT imaging was performed after resting intravenous injection of 10 mCi Tc-63m sestamibi. Subsequently, intravenous infusion of Lexiscan was performed under the supervision of the Cardiology staff. At peak effect of the drug, 30 mCi Tc-63m sestamibi was injected intravenously and standard myocardial SPECT imaging was performed. Quantitative gated imaging was also performed to evaluate left ventricular wall motion, and estimate left ventricular ejection fraction.  COMPARISON:  None.  FINDINGS: Perfusion: Large perfusion defects involving essentially the entire lateral wall, the entire inferior wall and the majority of the apex on the post regadenoson images without reversibility on the resting images. Relatively normal perfusion to the anterior wall and septum.  Wall Motion: Severe hypokinesis involving the septum, inferior wall and apex. Mild dyskinesis involving the lateral wall. Relatively well-preserved wall motion involving the anterior wall. Marked left ventricular dilation.  Left Ventricular Ejection Fraction: 16%  End diastolic volume 252 ml  End systolic volume 212 ml  IMPRESSION: 1. Large infarcts involving the lateral wall, inferior wall and apex. No evidence of reversible ischemia.  2. Severe hypokinesis involving the septum, inferior wall and apex. Mild dyskinesis involving the lateral wall. Marked left ventricular dilation.  3. Left ventricular ejection fraction 16%  4. High-risk stress test findings*.  *2012 Appropriate Use Criteria for Coronary Revascularization Focused Update: J Am Coll Cardiol. 2012;59(9):857-881. http://content.dementiazones.com.aspx?articleid=[email or web address]   Electronically Signed   By: Hulan Saas M.D.   On:  10/19/2014 14:10    Cardiac Studies:  Assessment/Plan:  Probable very small non-Q-wave myocardial  infarction. 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 2 coronary vessels, i.e., RCA and left circumflex in the past. Severe ischemic dilated cardiomyopathy Prostatic abscess. Hypertension. Diabetes mellitus. 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 Hyperlipidemia. Anemia of chronic disease.  Plan Discussed with patient's son and his brother over the phone regarding stress test finding and poor long-term prognosis and CODE STATUS and level of care, family will get together and decide  LOS: 6 days    Greg [name] 10/21/2014, 10:09 AM

## 2014-10-21 NOTE — Care Management Note (Signed)
Case Management Note  Patient Details  Name: Greg [name] MRN: [id number] Date of Birth: 11-Mar-1945  Subjective/Objective:          CM following for progression and d/c planning..          Action/Plan: 10/21/14 family meeting today and plan of care per family wishes is to continue HD and aggressive tx . The pt will d/c to home with ongoing [facility] and HHPT services. Family will be responsible for transportation to and from HD in the community. Pt will d/c to home by ambulance.   Expected Discharge Date:    10/21/2014              Expected Discharge Plan:  Home w Home Health Services  In-House Referral:  Clinical Social Work  Discharge planning Services  CM Consult  Post Acute Care Choice:  NA Choice offered to:  NA  DME Arranged:    DME Agency:     HH Arranged:  RN, PT HH Agency:  [facility]  Status of Service:  Completed, signed off  Medicare Important Message Given:    Date Medicare IM Given:    Medicare IM give by:    Date Additional Medicare IM Given:    Additional Medicare Important Message give by:     If discussed at Long Length of Stay Meetings, dates discussed:    Additional Comments:  Starlyn Skeans, RN 10/21/2014, 2:24 PM

## 2014-10-21 NOTE — Progress Notes (Signed)
Paged Dr. Waymon Amato, pts family would like to take him home today.

## 2014-10-21 NOTE — Clinical Social Work Note (Addendum)
Patient discharging home today and ambulance transport requested by family. Confirmed address with family, [facility] contacted and transport arranged.  CSW later informed that family plan to transport patient home due to the possible lengthy wait for ambulance transport. CSW contacted EMS and cancelled transport.   Genelle Bal, MSW, LCSW Licensed Clinical Social Worker Clinical Social Work Department [employer] [phone]

## 2014-10-21 NOTE — Progress Notes (Signed)
Reviewed discharge instructions with patient and family. IV removed. Called EMS. Because they are unsure of the time the family is going to take the patient themselves. Will continue to monitor.   Lenis Nettleton, Charlaine Dalton RN

## 2014-10-21 NOTE — Discharge Summary (Signed)
Physician Discharge Summary  Greg [name] RNH:[id number] DOB: 1945-07-25 DOA: 10/15/2014  PCP: Charolette Forward, MD  Admit date: 10/15/2014 Discharge date: 10/21/2014  Time spent: Greater than 30 minutes  Recommendations for Outpatient Follow-up:  1. Hemodialysis Center: Keep up regular hemodialysis on Tuesdays, Thursdays and Saturdays 2. Dr. Franchot Gallo, Urology in 1 month. MDs office will arrange follow-up. 3. Dr. Charolette Forward, PCP: call for appointment.  Discharge Diagnoses:  Principal Problem:   Prostatitis, acute Active Problems:   Essential hypertension   Coronary atherosclerosis   CVA (cerebral vascular accident) ([place])   ESRD on hemodialysis ([place])   Type 2 diabetes mellitus with complication (HCC)   Hyperlipidemia   ESRD (end stage renal disease) ([place])   Protein-calorie malnutrition, severe (HCC)   Elevated troponin   Lower abdominal pain   Encounter for palliative care   Discharge Condition: Improved & Stable  Diet recommendation: Regular diet  Filed Weights   10/19/14 2101 10/20/14 2100 10/21/14 1140  Weight: 58 kg (127 lb 13.9 oz) 60.5 kg (133 lb 6.1 oz) 58.7 kg (129 lb 6.6 oz)    History of present illness:  69 y.o. Male with ESRD on hemodialysis TTS, Cirrhosis, Alcohol abuse, Stroke,HTN, DM, Hepatitis, Coronary artery disease, Diabetic foot with dry gangrene (followed by [facility]) and Hypercholesterolemia p/w suprapubic abdominal pain, nausea & vomiting. CT Abdomen showed prostatomegaly, prostatitis & abscess. Urology consulted and plan surgery 10/10 pending Cardiology clearance - had positive troponin's - stress test 10/9. Treated in Aug 2016 for [facility] sepsis d/t HD catheter- removed and completed 2 weeks of Meropenum. Treated with IV antibiotics for acute prostatitis and prostatic abscess. Plan was for surgical intervention pending cardiology clearance. However nuclear stress testing shows LVEF 15% and possible small non-Q-wave MI making patient a very high risk  for surgical procedures and hence surgery was canceled.  Hospital Course:   Principal Problem:  Prostatitis, acute/prostatic abscess - CT abdomen & pelvis 10/5: Showed features of marked prostatomegaly and findings concerning for acute prostatitis and prostatic abscesses. - Treated empirically with broad-spectrum IV antibiotics (vancomycin and imipenem). On IV Abx since 10/5 - Urology was consulted/Dr. Luberta Robertson and initially planned transurethral unroofing of prostatic abscess on 10/10, pending cardiology clearance - However nuclear stress testing shows LVEF 15%, with large scar and possible small non-Q-wave MI making patient a very high risk for surgical procedures and hence surgery canceled. - Blood cultures 2: Negative. Unfortunately no urine culture was ever sent from the ER - Patient has been afebrile since admission. Does not complain of abdominal/rectal pain. Leukocytosis improving.  - I and palliative care M.D. discussed at great length with extended family regarding goals of care and CODE STATUS on 10/21/14. They were updated regarding his overall short-term and long-term poor prognosis due to frail physical status, multiple severe comorbidities, bedbound status for several years and progressive rapid decline over several months. They verbalized understanding and were going to discuss amongst themselves, with patient and extended family regarding CODE STATUS but did not make any decisions at this time. They also wanted to take him home today. - Discussed with infectious disease M.D. on call, urology & nephrology, will discharge home on Ancef 2 g across dialysis for total of 3 weeks with start date of 10/15/14.  Active Problems:  Coronary atherosclerosis with elevated troponin/probable NSTEMI - Asymptomatic of chest pain or dyspnea. - Peak troponin of 0.35 and trending down.  - Recent 2-D echo 8/16 showed EF of 50% with mild LVH - Continue statin, ACE inhibitor, beta  blocker, Plavix  currently on hold for surgery- will resume at DC - Cardiology input appreciated: History of multivessel CAD, silent infero-posterior wall MI in the past with occluded 2 coronary vessels i.e. RCA and left circumflex.  - nuclear stress testing shows LVEF 15% and possible small non-Q-wave MI making patient a very high risk for surgical procedures and hence surgery canceled. - Discussed with primary cardiologist who also discussed with patient's son regarding overall poor prognosis.  ESRD on hemodialysis TTS - Nephrology following. Completed hemodialysis 10/11. - Corrected serum calcium: 8.4. Management per nephrology-discussed with nephrology.   Essential hypertension - continue coreg, lisinopril. Controlled.    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   Type 2 diabetes mellitus with complication, nephropathy  - HbA1c 5.1 , on sliding scale insulin . Good inpatient control.  Hyperlipidemia - continue statin   Protein-calorie malnutrition, severe -Placed on nutritional supplements, nutrition consult   Hepatitis C with cirrhosis  Anemia of chronic disease/chronic kidney disease - Stable  Thrombocytopenia - Stable  Adult failure to thrive - As per nephrology input, patient had been seen by palliative care last admit in August and family were not interested in establishing goals of care at that time. Nephrology suggests would benefit from palliative care consult again given many comorbidities, noncompliance with HD (missing and short HD) and overall declining health. However patient planned for acute treatment/surgery on Monday and will defer for future. - Palliative care team did met with patient and family on 10/8 (see detailed note): At this time patient and family are hopeful to get better and returned home. They did not want to  entertain any further discussions pertaining to [place] or goals of care.  - Patient has extremely poor prognosis. He is not a candidate for ICD in view of sepsis and multiple comorbidities. Cardiology and nephrology have recommended palliative care consultation. I discussed in detail with patient's son Mr. Linnie Mcglocklin on 10/10 and advised him to discuss with the rest of his family to see if they wish to meet with palliative care again to readdress goals of care and he verbalized understanding. - As indicated above, I and palliative care M.D. met with extended family for goals of care meeting on 10/21/14.  PAF - During previous hospitalizations. Not on amiodarone PTA.    Code Status: full code  Family Communication: Discussed with patient at dialysis. Subsequently had family meeting along with palliative care M.D. and discussed with extended family including 3 brothers, 2 sons, sister- in-law and cousins.   Consultants:  Urology  Cardiology  Nephrology  Palliative care medicine  Procedures:  Hemodialysis  Antibiotics:  IV Rocephin 1 dose on 10/5  IV Zosyn 10/5 > 10/6   IV vancomycin 10/5 >10/11  IV meropenem 10/7 >10/11  Discharge Exam:  Complaints:  Denies complaints.  Filed Vitals:   10/21/14 1100 10/21/14 1130 10/21/14 1140 10/21/14 1315  BP: 143/71 137/72 134/68 132/57  Pulse: 79 80 80 76  Temp:   97.8 F (36.6 C) 98.7 F (37.1 C)  TempSrc:   Oral Oral  Resp:   17 18  Weight:   58.7 kg (129 lb 6.6 oz)   SpO2:   100% 100%    General exam: Moderately built and frail middle-aged male, chronically ill-looking lying comfortably supine in bed undergoing dialysis this morning. Respiratory system: Clear. No increased work of breathing. Cardiovascular  system: S1 & S2 heard, RRR. No JVD, murmurs, gallops, clicks or pedal edema. Nontele Gastrointestinal system: Abdomen is nondistended, soft and nontender. Normal bowel sounds heard. Central nervous system:  Alert and oriented. No focal neurological deficits. Extremities: Symmetric 5 x 5 power.   Discharge Instructions      Discharge Instructions    (HEART FAILURE PATIENTS) Call MD:  Anytime you have any of the following symptoms: 1) 3 pound weight gain in 24 hours or 5 pounds in 1 week 2) shortness of breath, with or without a dry hacking cough 3) swelling in the hands, feet or stomach 4) if you have to sleep on extra pillows at night in order to breathe.    Complete by:  As directed      Call MD for:  difficulty breathing, headache or visual disturbances    Complete by:  As directed      Call MD for:  extreme fatigue    Complete by:  As directed      Call MD for:  hives    Complete by:  As directed      Call MD for:  persistant dizziness or light-headedness    Complete by:  As directed      Call MD for:  persistant nausea and vomiting    Complete by:  As directed      Call MD for:  redness, tenderness, or signs of infection (pain, swelling, redness, odor or green/yellow discharge around incision site)    Complete by:  As directed      Call MD for:  severe uncontrolled pain    Complete by:  As directed      Call MD for:  temperature >100.4    Complete by:  As directed      Diet general    Complete by:  As directed      Increase activity slowly    Complete by:  As directed             Medication List    STOP taking these medications        amiodarone 200 MG tablet  Commonly known as:  PACERONE     calcium acetate 667 MG capsule  Commonly known as:  PHOSLO     docusate sodium 100 MG capsule  Commonly known as:  COLACE     meropenem 500 mg in sodium chloride 0.9 % 50 mL     oxyCODONE 5 MG immediate release tablet  Commonly known as:  Oxy IR/ROXICODONE     polyethylene glycol packet  Commonly known as:  MIRALAX / GLYCOLAX      TAKE these medications        acetaminophen 325 MG tablet  Commonly known as:  TYLENOL  Take 2 tablets (650 mg total) by mouth every 6  (six) hours as needed for mild pain, moderate pain, fever or headache (or Fever >/= 101).     aspirin EC 325 MG tablet  Take 325 mg by mouth daily.     atorvastatin 40 MG tablet  Commonly known as:  LIPITOR  Take 40 mg by mouth daily.     carvedilol 10 MG 24 hr capsule  Commonly known as:  COREG CR  Take 1 capsule (10 mg total) by mouth daily.     ceFAZolin 2-3 GM-% Solr  Commonly known as:  ANCEF  Inject 50 mLs (2 g total) into the vein Every Tuesday,Thursday,and Saturday with dialysis.     cinacalcet 30 MG  tablet  Commonly known as:  SENSIPAR  Take 6 tablets (180 mg total) by mouth daily with supper.     clopidogrel 75 MG tablet  Commonly known as:  PLAVIX  Take 1 tablet (75 mg total) by mouth every other day.     feeding supplement Liqd  Take 1 Container by mouth 3 (three) times daily between meals.     gabapentin 100 MG capsule  Commonly known as:  NEURONTIN  Take 100 mg by mouth daily.     lanthanum 1000 MG chewable tablet  Commonly known as:  FOSRENOL  Chew 1 tablet (1,000 mg total) by mouth 3 (three) times daily with meals.     lisinopril 5 MG tablet  Commonly known as:  PRINIVIL,ZESTRIL  Take 5 mg by mouth daily.     multivitamin Tabs tablet  Take 1 tablet by mouth at bedtime.     NOVOLIN 70/30 (70-30) 100 UNIT/ML injection  Generic drug:  insulin NPH-regular Human  Inject 15 Units into the skin daily.     oxyCODONE-acetaminophen 5-325 MG tablet  Commonly known as:  PERCOCET/ROXICET  Take 1 tablet by mouth every 8 (eight) hours as needed for severe pain.     pantoprazole 40 MG tablet  Commonly known as:  PROTONIX  Take 1 tablet (40 mg total) by mouth 2 (two) times daily.       Follow-up Information    Schedule an appointment as soon as possible for a visit with Charolette Forward, MD.   Specialty:  Cardiology   Contact information:   [phone] [place] [address] [phone]       Follow up with Jorja Loa, MD.    Specialty:  Urology   Why:  MDs office will arrange follow-up appointment in 1 month.   Contact information:   [address] [place] [address] [phone]       Follow up with Hemodialysis center.   Why:  Keep regular hemodialysis appointments on Tuesdays, Thursdays and Saturdays.       The results of significant diagnostics from this hospitalization (including imaging, microbiology, ancillary and laboratory) are listed below for reference.    Significant Diagnostic Studies: Ct Abdomen Pelvis Wo Contrast  10/15/2014   CLINICAL DATA:  69 year old male with lower abdominal discomfort since yesterday  EXAM: CT ABDOMEN AND PELVIS WITHOUT CONTRAST  TECHNIQUE: Multidetector CT imaging of the abdomen and pelvis was performed following the standard protocol without IV contrast.  COMPARISON:  Prior abdominal radiographs 08/25/2014  FINDINGS: Lower Chest: Dependent atelectasis bilaterally slightly greater on the right than the left secondary to patient positioning. The heart is enlarged. Calcifications are present within the visualized coronary arteries. Partial calcification of the mitral valve annulus and aortic valve also noted. No pericardial effusion.  Abdomen: Unenhanced CT was performed per clinician order. Lack of IV contrast limits sensitivity and specificity, especially for evaluation of abdominal/pelvic solid viscera. Within nose limitations, unremarkable CT appearance of the stomach, duodenum, spleen, adrenal glands and pancreas. Normal hepatic contour and morphology. Atherosclerotic calcifications noted in the hepatic arteries. Numerous stones layer within the gallbladder lumen. The gallbladder is mildly distended at nearly 4.9 cm. No definite gallbladder wall thickening or pericholecystic fluid.  The bilateral native kidneys are atrophic. There are numerous calcifications within the renal arteries. Numerous low-attenuation lesions bilaterally are incompletely characterized in the absence of  intravenous contrast. The lesions are low in attenuation suggesting simple cysts.  No focal bowel wall thickening or evidence  of obstruction. No suspicious adenopathy. Trace free fluid in the right pericolic gutter.  Pelvis: Marked prostatomegaly. The prostate gland measures 6.9 cm in diameter and is heterogeneous with internal areas of low attenuation. This is incompletely evaluated in the absence of intravenous contrast. The seminal vesicles are also enlarged and somewhat indistinct suggesting edema. Additionally, there is a small amounts of stranding extending from the seminal vesicles into the perirectal fat. The bladder is decompressed but appears circumferentially thick walled. This may reflect bladder trabeculation in the setting of outlet obstruction related to the prostatomegaly.  Bones/Soft Tissues: Gallbladder is unremarkable. No acute fracture or aggressive appearing lytic or blastic osseous lesion.  Vascular: Extensive calcification of all visualized arteries most consistent with medial arterial sclerosis. No aneurysm.  IMPRESSION: 1. Marked prostatomegaly with rounded areas of internal low-attenuation measuring up to 2.6 cm. In combination with bilateral enlargement of the edematous seminal vesicles and adjacent inflammatory stranding, these findings are concerning for acute prostatitis. The low-attenuation regions may represent areas of focal edema, or potentially abscesses. 2. Trabeculated bladder wall suggests associated bladder outlet obstruction. The bladder is not distended. 3. Trace free fluid in the right pericolic gutter is nonspecific but abnormal. This may be secondary to an underlying infectious/inflammatory process of the small or large bowel which is otherwise occult by imaging. 4. Distended gallbladder containing sludge and small stones. 5. Borderline cardiomegaly. 6. Atrophic native kidneys. 7. Extensive atherosclerotic vascular calcifications consistent with medial arterial sclerosis  which is often seen in the setting of longstanding chronic renal disease. These results were called by telephone at the time of interpretation on 10/15/2014 at 7:10 pm to Dr. Wilson Singer, who verbally acknowledged these results.   Electronically Signed   By: Jacqulynn Cadet M.D.   On: 10/15/2014 19:11   Dg Chest 2 View  10/15/2014   CLINICAL DATA:  69 year old male with abdominal pain for 2 days, fatigue. Dialysis yesterday. Initial encounter.  EXAM: CHEST  2 VIEW  COMPARISON:  08/27/2014 and earlier.  FINDINGS: Seated AP and lateral views of the chest. Mild calcified plaque along the right hemidiaphragm appears stable. Stable lung volumes. Stable cardiomegaly and mediastinal contours. Extensive calcified atherosclerosis of the aorta, and including the great vessels on the right. Pulmonary vascularity is increased compared to prior studies. Right IJ approach dialysis catheter is been removed. No pneumothorax or pleural effusion. No consolidation. Osteopenia. No acute osseous abnormality identified.  IMPRESSION: Pulmonary vascular congestion / mild interstitial edema appears increased from prior studies. No definite pleural effusion and otherwise stable chest with cardiomegaly, and calcified aortic atherosclerosis.   Electronically Signed   By: Genevie Ann M.D.   On: 10/15/2014 15:25   Nm Myocar Multi W/spect W/wall Motion / Ef  10/19/2014   CLINICAL DATA:  69 year old with prior MI and stroke, current history of diabetes, hypertension and hyperlipidemia, current history of hepatic cirrhosis, current history of end-stage renal disease on hemodialysis, who presented with prostatitis and likely pelvic abscess 4 days ago and was noted to have elevated troponin levels.  EXAM: MYOCARDIAL IMAGING WITH SPECT (REST AND PHARMACOLOGIC-STRESS)  GATED LEFT VENTRICULAR WALL MOTION STUDY  LEFT VENTRICULAR EJECTION FRACTION  TECHNIQUE: Standard myocardial SPECT imaging was performed after resting intravenous injection of 10 mCi Tc-38m  sestamibi. Subsequently, intravenous infusion of Lexiscan was performed under the supervision of the Cardiology staff. At peak effect of the drug, 30 mCi Tc-48m sestamibi was injected intravenously and standard myocardial SPECT imaging was performed. Quantitative gated imaging was also performed to evaluate left  ventricular wall motion, and estimate left ventricular ejection fraction.  COMPARISON:  None.  FINDINGS: Perfusion: Large perfusion defects involving essentially the entire lateral wall, the entire inferior wall and the majority of the apex on the post regadenoson images without reversibility on the resting images. Relatively normal perfusion to the anterior wall and septum.  Wall Motion: Severe hypokinesis involving the septum, inferior wall and apex. Mild dyskinesis involving the lateral wall. Relatively well-preserved wall motion involving the anterior wall. Marked left ventricular dilation.  Left Ventricular Ejection Fraction: 49%  End diastolic volume 826 ml  End systolic volume 415 ml  IMPRESSION: 1. Large infarcts involving the lateral wall, inferior wall and apex. No evidence of reversible ischemia.  2. Severe hypokinesis involving the septum, inferior wall and apex. Mild dyskinesis involving the lateral wall. Marked left ventricular dilation.  3. Left ventricular ejection fraction 16%  4. High-risk stress test findings*.  *2012 Appropriate Use Criteria for Coronary Revascularization Focused Update: J Am Coll Cardiol. 8309;40(7):680-881. http://content.airportbarriers.com.aspx?articleid=1201161   Electronically Signed   By: Evangeline Dakin M.D.   On: 10/19/2014 14:10    Microbiology: Recent Results (from the past 240 hour(s))  Blood culture (routine x 2)     Status: None   Collection Time: 10/15/14  7:40 PM  Result Value Ref Range Status   Specimen Description BLOOD RIGHT ARM  Final   Special Requests BOTTLES DRAWN AEROBIC AND ANAEROBIC 5CC  Final   Culture NO GROWTH 5 DAYS  Final    Report Status 10/20/2014 FINAL  Final  Blood culture (routine x 2)     Status: None   Collection Time: 10/15/14  7:52 PM  Result Value Ref Range Status   Specimen Description BLOOD RIGHT HAND  Final   Special Requests BOTTLES DRAWN AEROBIC AND ANAEROBIC 5CC  Final   Culture NO GROWTH 5 DAYS  Final   Report Status 10/20/2014 FINAL  Final  MRSA PCR Screening     Status: None   Collection Time: 10/15/14  9:49 PM  Result Value Ref Range Status   MRSA by PCR NEGATIVE NEGATIVE Final    Comment:        The GeneXpert MRSA Assay (FDA approved for NASAL specimens only), is one component of a comprehensive MRSA colonization surveillance program. It is not intended to diagnose MRSA infection nor to guide or monitor treatment for MRSA infections.      Labs: Basic Metabolic Panel:  Recent Labs Lab 10/15/14 1427 10/16/14 0520 10/17/14 0553 10/18/14 0555 10/21/14 0752  NA 132* 133* 139 140 130*  K 4.4 3.8 4.4 4.2 4.3  CL 91* 91* 94* 98* 96*  CO2 $Re'29 27 26 27 22  'oyG$ GLUCOSE 126* 98 105* 104* 185*  BUN 24* 27* 13 20 33*  CREATININE 4.67* 5.06* 3.27* 4.14* 5.34*  CALCIUM 8.3* 8.2* 8.1* 7.2* 6.2*  MG  --  2.2  --   --   --   PHOS  --  4.3  --   --  3.1   Liver Function Tests:  Recent Labs Lab 10/15/14 1427 10/16/14 0520 10/21/14 0752  AST 27 17  --   ALT 9* 7*  --   ALKPHOS 132* 122  --   BILITOT 0.8 1.1  --   PROT 6.7 6.3*  --   ALBUMIN 1.9* 1.8* 1.3*   No results for input(s): LIPASE, AMYLASE in the last 168 hours. No results for input(s): AMMONIA in the last 168 hours. CBC:  Recent Labs Lab 10/15/14 1427 10/16/14  2956 10/17/14 0553 10/18/14 0555 10/19/14 0655 10/21/14 0753  WBC 18.4* 17.5* 17.9* 16.4* 14.5* 13.4*  NEUTROABS 16.2*  --   --   --   --   --   HGB 10.0* 9.9* 10.4* 10.0* 9.8* 9.2*  HCT 30.8* 30.7* 33.8* 31.0* 31.0* 28.5*  MCV 86.3 86.2 87.6 87.8 86.6 84.3  PLT 105* 115* 142* 148* 151 135*   Cardiac Enzymes:  Recent Labs Lab 10/15/14 1427  10/15/14 2211 10/15/14 2317 10/16/14 0520 10/16/14 1020  TROPONINI 0.35* 0.24* 0.25* 0.22* 0.19*   BNP: BNP (last 3 results) No results for input(s): BNP in the last 8760 hours.  ProBNP (last 3 results) No results for input(s): PROBNP in the last 8760 hours.  CBG:  Recent Labs Lab 10/20/14 0715 10/20/14 1122 10/20/14 1708 10/20/14 2107 10/21/14 1307  GLUCAP 138* 141* 167* 156* 122*        Signed:  Vernell Leep, MD, FACP, FHM. [facility] Pager [phone]  If 7PM-7AM, please contact night-coverage [email or web address] Password [email or web address] 10/21/2014, 1:56 PM

## 2014-10-21 NOTE — Progress Notes (Addendum)
Family meeting note:  Family meeting with hospitalist Dr. Waymon Amato, and several family members for around 45-50 minutes today.  Members present: 1. Patient's 2 sons 2. Patient's sister-in-law 3. Patient's 3 brothers 4. Several of the patient's nieces and nephews.  Medical background:  Patient's older son stated that the patient essentially has had bed bound status for at least 2 years now. He has had gradual progressive functional decline. There is no reported history of mental status decline. More often than not, the patient has been refusing to go to his dialysis sessions. More often than not, the patient has been refusing to come to the hospital. The patient has had ongoing diminished appetite, lack of interest in food.  Medical discussion:  Patient's acute medical conditions pertaining to his acute prostatic abscess discussed in great detail. The patient's family has received information from cardiologist Dr. Sharyn Lull. The patient and family have a long established trust relationship with Dr. Sharyn Lull. The patient's older son states that they have been told that the patient is with several medical conditions. In the context of this hospitalization, it is believed that the patient has had a small non-Q-wave MI. The patient is known to have markedly reduced ejection fraction.  The discussions made were pertaining to the patient's end-stage renal disease, new onset atrial fibrillation and a previous hospitalization, recently diagnosed low ejection fraction and possible underlying coronary artery disease. In addition to patient's ongoing rapid progressive decline, discussions were held about the patient not being deemed safe to undergo unroofing of prostate abscess.  Sense of goals of care discussions were held. Multiple conversations around CODE STATUS, disposition, prognosis set her overall held in great detail. Discussed extensively. All questions answered. Patient's family has been given  information on how to contact the palliative medicine service. Active listening and extensive supportive care provided. Discussed appropriateness of DO NOT RESUSCITATE/DO NOT INTUBATE. Discussed patient's trajectory if/when the burden of dialysis is deemed as higher than any intended benefit. Also advised addition of hospice services in the near future.  Outcome/plan: The patient is to be discharged from the hospital today on 10/21/14. If the patient requires repeat hospitalization, palliative medicine service will be happy to get involved to continue the conversations, to help guide appropriate decision-making, to assist however we can with symptom management etc.  Rosalin Hawking MD [phone] [facility] team

## 2014-10-21 NOTE — Progress Notes (Signed)
Pt refused to take daily medications.

## 2014-10-22 NOTE — Progress Notes (Signed)
Addendum  I was contacted re pre authorization for Sensipar and Protonix  I called patient's insurance:  Sensipar will not require PA if changed from 30 mg tabs to 90 mg tabs. Called pharmacy to change to 90 mg tabs which patient will have to take 2 tabs (180 mg) daily at supper.  Protonix was approved. Reference # [id number].  Called and discussed with pharmacist at [facility], MD, FACP, [facility]. [facility] Pager [phone]  If 7PM-7AM, please contact night-coverage [email or web address] Password [email or web address] 10/22/2014, 2:00 PM

## 2014-10-27 ENCOUNTER — Ambulatory Visit: Payer: Medicare Other | Admitting: Internal Medicine

## 2014-11-04 ENCOUNTER — Other Ambulatory Visit: Payer: Self-pay | Admitting: Urology

## 2014-11-04 DIAGNOSIS — N412 Abscess of prostate: Secondary | ICD-10-CM

## 2014-11-04 DIAGNOSIS — N186 End stage renal disease: Secondary | ICD-10-CM

## 2014-11-05 ENCOUNTER — Ambulatory Visit (HOSPITAL_COMMUNITY)
Admission: RE | Admit: 2014-11-05 | Discharge: 2014-11-05 | Disposition: A | Discharge status: Home / Self Care | Payer: Medicare Other | Source: Ambulatory Visit | Attending: Urology | Admitting: Urology

## 2014-11-05 DIAGNOSIS — I700 Atherosclerosis of aorta: Secondary | ICD-10-CM | POA: Insufficient documentation

## 2014-11-05 DIAGNOSIS — N186 End stage renal disease: Secondary | ICD-10-CM | POA: Diagnosis not present

## 2014-11-05 DIAGNOSIS — N3289 Other specified disorders of bladder: Secondary | ICD-10-CM | POA: Diagnosis not present

## 2014-11-05 DIAGNOSIS — N412 Abscess of prostate: Secondary | ICD-10-CM | POA: Insufficient documentation

## 2014-11-05 DIAGNOSIS — M5136 Other intervertebral disc degeneration, lumbar region: Secondary | ICD-10-CM | POA: Diagnosis not present

## 2014-12-10 ENCOUNTER — Emergency Department (HOSPITAL_COMMUNITY): Payer: Medicare Other

## 2014-12-10 ENCOUNTER — Inpatient Hospital Stay (HOSPITAL_COMMUNITY)
Admission: EM | Admit: 2014-12-10 | Discharge: 2014-12-15 | DRG: 871 | Disposition: A | Discharge status: Home Health | Payer: Medicare Other | Attending: Internal Medicine | Admitting: Internal Medicine

## 2014-12-10 ENCOUNTER — Encounter (HOSPITAL_COMMUNITY): Payer: Self-pay | Admitting: *Deleted

## 2014-12-10 DIAGNOSIS — I10 Essential (primary) hypertension: Secondary | ICD-10-CM | POA: Diagnosis present

## 2014-12-10 DIAGNOSIS — Y95 Nosocomial condition: Secondary | ICD-10-CM | POA: Diagnosis present

## 2014-12-10 DIAGNOSIS — Z7401 Bed confinement status: Secondary | ICD-10-CM

## 2014-12-10 DIAGNOSIS — I2510 Atherosclerotic heart disease of native coronary artery without angina pectoris: Secondary | ICD-10-CM | POA: Diagnosis present

## 2014-12-10 DIAGNOSIS — Z87891 Personal history of nicotine dependence: Secondary | ICD-10-CM

## 2014-12-10 DIAGNOSIS — K7460 Unspecified cirrhosis of liver: Secondary | ICD-10-CM | POA: Diagnosis present

## 2014-12-10 DIAGNOSIS — E43 Unspecified severe protein-calorie malnutrition: Secondary | ICD-10-CM | POA: Diagnosis present

## 2014-12-10 DIAGNOSIS — I69354 Hemiplegia and hemiparesis following cerebral infarction affecting left non-dominant side: Secondary | ICD-10-CM | POA: Diagnosis not present

## 2014-12-10 DIAGNOSIS — Z794 Long term (current) use of insulin: Secondary | ICD-10-CM

## 2014-12-10 DIAGNOSIS — I69320 Aphasia following cerebral infarction: Secondary | ICD-10-CM | POA: Diagnosis not present

## 2014-12-10 DIAGNOSIS — N390 Urinary tract infection, site not specified: Secondary | ICD-10-CM | POA: Diagnosis present

## 2014-12-10 DIAGNOSIS — N2581 Secondary hyperparathyroidism of renal origin: Secondary | ICD-10-CM | POA: Diagnosis present

## 2014-12-10 DIAGNOSIS — L89152 Pressure ulcer of sacral region, stage 2: Secondary | ICD-10-CM | POA: Diagnosis present

## 2014-12-10 DIAGNOSIS — A4151 Sepsis due to Escherichia coli [E. coli]: Secondary | ICD-10-CM | POA: Diagnosis present

## 2014-12-10 DIAGNOSIS — F0390 Unspecified dementia without behavioral disturbance: Secondary | ICD-10-CM | POA: Diagnosis present

## 2014-12-10 DIAGNOSIS — F1010 Alcohol abuse, uncomplicated: Secondary | ICD-10-CM | POA: Diagnosis present

## 2014-12-10 DIAGNOSIS — G9341 Metabolic encephalopathy: Secondary | ICD-10-CM | POA: Diagnosis present

## 2014-12-10 DIAGNOSIS — I255 Ischemic cardiomyopathy: Secondary | ICD-10-CM | POA: Diagnosis present

## 2014-12-10 DIAGNOSIS — Z7982 Long term (current) use of aspirin: Secondary | ICD-10-CM | POA: Diagnosis not present

## 2014-12-10 DIAGNOSIS — Z1612 Extended spectrum beta lactamase (ESBL) resistance: Secondary | ICD-10-CM | POA: Diagnosis present

## 2014-12-10 DIAGNOSIS — E1152 Type 2 diabetes mellitus with diabetic peripheral angiopathy with gangrene: Secondary | ICD-10-CM | POA: Diagnosis present

## 2014-12-10 DIAGNOSIS — J189 Pneumonia, unspecified organism: Secondary | ICD-10-CM | POA: Diagnosis present

## 2014-12-10 DIAGNOSIS — R64 Cachexia: Secondary | ICD-10-CM | POA: Diagnosis present

## 2014-12-10 DIAGNOSIS — Z992 Dependence on renal dialysis: Secondary | ICD-10-CM | POA: Diagnosis not present

## 2014-12-10 DIAGNOSIS — Z87440 Personal history of urinary (tract) infections: Secondary | ICD-10-CM

## 2014-12-10 DIAGNOSIS — Z833 Family history of diabetes mellitus: Secondary | ICD-10-CM | POA: Diagnosis not present

## 2014-12-10 DIAGNOSIS — E1122 Type 2 diabetes mellitus with diabetic chronic kidney disease: Secondary | ICD-10-CM | POA: Diagnosis present

## 2014-12-10 DIAGNOSIS — B1920 Unspecified viral hepatitis C without hepatic coma: Secondary | ICD-10-CM | POA: Diagnosis present

## 2014-12-10 DIAGNOSIS — Z7902 Long term (current) use of antithrombotics/antiplatelets: Secondary | ICD-10-CM | POA: Diagnosis not present

## 2014-12-10 DIAGNOSIS — A419 Sepsis, unspecified organism: Secondary | ICD-10-CM | POA: Diagnosis present

## 2014-12-10 DIAGNOSIS — L8990 Pressure ulcer of unspecified site, unspecified stage: Secondary | ICD-10-CM | POA: Diagnosis not present

## 2014-12-10 DIAGNOSIS — Z8249 Family history of ischemic heart disease and other diseases of the circulatory system: Secondary | ICD-10-CM

## 2014-12-10 DIAGNOSIS — Z66 Do not resuscitate: Secondary | ICD-10-CM | POA: Diagnosis not present

## 2014-12-10 DIAGNOSIS — I252 Old myocardial infarction: Secondary | ICD-10-CM

## 2014-12-10 DIAGNOSIS — N186 End stage renal disease: Secondary | ICD-10-CM | POA: Diagnosis present

## 2014-12-10 DIAGNOSIS — Z6821 Body mass index (BMI) 21.0-21.9, adult: Secondary | ICD-10-CM | POA: Diagnosis not present

## 2014-12-10 DIAGNOSIS — D649 Anemia, unspecified: Secondary | ICD-10-CM | POA: Diagnosis present

## 2014-12-10 LAB — CBC WITH DIFFERENTIAL/PLATELET
BASOS PCT: 0 %
Basophils Absolute: 0 10*3/uL (ref 0.0–0.1)
EOS ABS: 0 10*3/uL (ref 0.0–0.7)
EOS PCT: 0 %
HCT: 31.1 % — ABNORMAL LOW (ref 39.0–52.0)
HEMOGLOBIN: 9.6 g/dL — AB (ref 13.0–17.0)
LYMPHS ABS: 1.9 10*3/uL (ref 0.7–4.0)
Lymphocytes Relative: 10 %
MCH: 26.5 pg (ref 26.0–34.0)
MCHC: 30.9 g/dL (ref 30.0–36.0)
MCV: 85.9 fL (ref 78.0–100.0)
MONO ABS: 0.9 10*3/uL (ref 0.1–1.0)
MONOS PCT: 5 %
NEUTROS PCT: 85 %
Neutro Abs: 16 10*3/uL — ABNORMAL HIGH (ref 1.7–7.7)
Platelets: 125 10*3/uL — ABNORMAL LOW (ref 150–400)
RBC: 3.62 MIL/uL — ABNORMAL LOW (ref 4.22–5.81)
RDW: 18.5 % — AB (ref 11.5–15.5)
WBC: 18.8 10*3/uL — ABNORMAL HIGH (ref 4.0–10.5)

## 2014-12-10 LAB — COMPREHENSIVE METABOLIC PANEL
ALBUMIN: 1.4 g/dL — AB (ref 3.5–5.0)
ALK PHOS: 119 U/L (ref 38–126)
ALT: 10 U/L — ABNORMAL LOW (ref 17–63)
ANION GAP: 10 (ref 5–15)
AST: 24 U/L (ref 15–41)
BILIRUBIN TOTAL: 0.7 mg/dL (ref 0.3–1.2)
BUN: 29 mg/dL — ABNORMAL HIGH (ref 6–20)
CALCIUM: 12.2 mg/dL — AB (ref 8.9–10.3)
CO2: 26 mmol/L (ref 22–32)
Chloride: 101 mmol/L (ref 101–111)
Creatinine, Ser: 3.71 mg/dL — ABNORMAL HIGH (ref 0.61–1.24)
GFR calc non Af Amer: 15 mL/min — ABNORMAL LOW (ref >60)
GFR, EST AFRICAN AMERICAN: 18 mL/min — AB (ref >60)
Glucose, Bld: 159 mg/dL — ABNORMAL HIGH (ref 65–99)
POTASSIUM: 3.8 mmol/L (ref 3.5–5.1)
SODIUM: 137 mmol/L (ref 135–145)
TOTAL PROTEIN: 6.6 g/dL (ref 6.5–8.1)

## 2014-12-10 LAB — I-STAT CG4 LACTIC ACID, ED
Lactic Acid, Venous: 1.97 mmol/L (ref 0.5–2.0)
Lactic Acid, Venous: 1.58 mmol/L (ref 0.5–2.0)

## 2014-12-10 MED ORDER — CINACALCET HCL 30 MG PO TABS
180.0000 mg | ORAL_TABLET | Freq: Every day | ORAL | Status: DC
  Filled 2014-12-10: qty 6

## 2014-12-10 MED ORDER — PIPERACILLIN-TAZOBACTAM 3.375 G IVPB 30 MIN
3.3750 g | Freq: Once | INTRAVENOUS | Status: AC
  Administered 2014-12-10: 3.375 g via INTRAVENOUS
  Filled 2014-12-10: qty 50

## 2014-12-10 MED ORDER — ATORVASTATIN CALCIUM 40 MG PO TABS
40.0000 mg | ORAL_TABLET | Freq: Every day | ORAL | Status: DC
  Administered 2014-12-12 – 2014-12-14 (×2): 40 mg via ORAL
  Filled 2014-12-10 (×3): qty 1

## 2014-12-10 MED ORDER — VANCOMYCIN HCL 10 G IV SOLR
1250.0000 mg | Freq: Once | INTRAVENOUS | Status: AC
  Administered 2014-12-10: 1250 mg via INTRAVENOUS
  Filled 2014-12-10: qty 1250

## 2014-12-10 MED ORDER — ASPIRIN EC 325 MG PO TBEC
325.0000 mg | DELAYED_RELEASE_TABLET | Freq: Every day | ORAL | Status: DC
  Administered 2014-12-12 – 2014-12-14 (×2): 325 mg via ORAL
  Filled 2014-12-10 (×2): qty 1

## 2014-12-10 MED ORDER — PANTOPRAZOLE SODIUM 40 MG PO TBEC
40.0000 mg | DELAYED_RELEASE_TABLET | Freq: Two times a day (BID) | ORAL | Status: DC
  Administered 2014-12-11 – 2014-12-14 (×5): 40 mg via ORAL
  Filled 2014-12-10 (×6): qty 1

## 2014-12-10 MED ORDER — ACETAMINOPHEN 650 MG RE SUPP: 650.0000 mg | RECTAL | Status: DC | PRN

## 2014-12-10 MED ORDER — PIPERACILLIN-TAZOBACTAM IN DEX 2-0.25 GM/50ML IV SOLN
2.2500 g | Freq: Three times a day (TID) | INTRAVENOUS | Status: DC
Start: 2014-12-11 — End: 2014-12-11
  Filled 2014-12-10 (×2): qty 50

## 2014-12-10 MED ORDER — CARVEDILOL PHOSPHATE ER 10 MG PO CP24
10.0000 mg | ORAL_CAPSULE | Freq: Every day | ORAL | Status: DC
  Administered 2014-12-12 – 2014-12-14 (×2): 10 mg via ORAL
  Filled 2014-12-10 (×5): qty 1

## 2014-12-10 MED ORDER — GABAPENTIN 100 MG PO CAPS
100.0000 mg | ORAL_CAPSULE | Freq: Every day | ORAL | Status: DC
  Administered 2014-12-12 – 2014-12-14 (×2): 100 mg via ORAL
  Filled 2014-12-10 (×3): qty 1

## 2014-12-10 MED ORDER — ACETAMINOPHEN 325 MG PO TABS: 650.0000 mg | ORAL_TABLET | Freq: Four times a day (QID) | ORAL | Status: DC | PRN

## 2014-12-10 MED ORDER — SODIUM CHLORIDE 0.9 % IV BOLUS (SEPSIS)
250.0000 mL | Freq: Once | INTRAVENOUS | Status: AC
  Administered 2014-12-10: 250 mL via INTRAVENOUS

## 2014-12-10 MED ORDER — INSULIN ASPART 100 UNIT/ML ~~LOC~~ SOLN
0.0000 [IU] | SUBCUTANEOUS | Status: DC
  Administered 2014-12-13 (×2): 1 [IU] via SUBCUTANEOUS

## 2014-12-10 MED ORDER — HEPARIN SODIUM (PORCINE) 5000 UNIT/ML IJ SOLN
5000.0000 [IU] | Freq: Three times a day (TID) | INTRAMUSCULAR | Status: DC
  Administered 2014-12-11 – 2014-12-13 (×6): 5000 [IU] via SUBCUTANEOUS
  Filled 2014-12-10 (×6): qty 1

## 2014-12-10 MED ORDER — BOOST / RESOURCE BREEZE PO LIQD
1.0000 | Freq: Three times a day (TID) | ORAL | Status: DC
  Administered 2014-12-12: 1 via ORAL

## 2014-12-10 MED ORDER — CLOPIDOGREL BISULFATE 75 MG PO TABS
75.0000 mg | ORAL_TABLET | ORAL | Status: DC
  Administered 2014-12-14: 75 mg via ORAL
  Filled 2014-12-10: qty 1

## 2014-12-10 MED ORDER — SODIUM CHLORIDE 0.9 % IV SOLN
INTRAVENOUS | Status: DC
  Administered 2014-12-11: 04:00:00 via INTRAVENOUS

## 2014-12-10 MED ORDER — RENA-VITE PO TABS
1.0000 | ORAL_TABLET | Freq: Every day | ORAL | Status: DC
  Administered 2014-12-11 – 2014-12-13 (×3): 1 via ORAL
  Filled 2014-12-10 (×5): qty 1

## 2014-12-10 NOTE — ED Notes (Signed)
Spoke with DR. Steinl and Cheri Fowler, [place] about if they wanted to activate a code sepsis, Dr. Denton Lank advised they would make patient a code sepsis

## 2014-12-10 NOTE — H&P (Signed)
[facility] History and Physical  Skippy Marhefka WGN:[id number] DOB: 1945-12-29 DOA: 12/10/2014  Referring physician: EDP PCP: Rinaldo Cloud, MD   Chief Complaint: AMS   HPI: Xiong Haidar is a 69 y.o. 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  Patient brought in by family with AMS for the past 1 day and several day history of cough and congestion.  Family also notes foul smelling urine and a h/o UTIs including most recently with an ESBL ecoli that was sensitive only to imipenem and zosyn a couple of months back.  Fever at home of up to 103 per family (febrile at 101.9 in ED).  Review of Systems: patient non-verbal.  Past Medical History  Diagnosis Date  . ESRD on hemodialysis (HCC)     Started HD around 2006.   Marland Kitchen Cirrhosis (HCC)   . Alcohol abuse   . Stroke [facility])     left sided deficits  . Hypertension   . Diabetes mellitus     type 2   . Hepatitis     hep c  . Constipation   . Coronary artery disease   . Hypercholesterolemia    Past Surgical History  Procedure Laterality Date  . Cataract extraction w/ intraocular lens implant    . Av fistula placement Left 2006    upper arm  . Peripheral vascular catheterization N/A 06/06/2014    Procedure: Abdominal Aortogram;  Surgeon: Sherren Kerns, MD;  Location: [facility] INVASIVE CV LAB;  Service: Cardiovascular;  Laterality: N/A;  . Lower extremity angiogram Bilateral 06/06/2014    Procedure: Lower Extremity Angiogram;  Surgeon: Sherren Kerns, MD;  Location: [facility] INVASIVE CV LAB;  Service: Cardiovascular;  Laterality: Bilateral;  . Peripheral vascular catheterization Left 06/06/2014    Procedure: Peripheral Vascular Intervention;  Surgeon: Sherren Kerns, MD;  Location: [facility] INVASIVE CV LAB;  Service: Cardiovascular;  Laterality: Left;  pta drug coated x3  . Revison of arteriovenous fistula Left 08/04/2014    Procedure: PLICATION OF  LEFT UPPER ARM ARTERIOVENOUS FISTULA;  Surgeon: Sherren Kerns, MD;  Location[facility] OR;  Service: Vascular;  Laterality: Left;  . Insertion of dialysis catheter Right 08/04/2014    Procedure: INSERTION OF DIALYSIS CATHETER RIGHT INTERNAL JUGULAR VEIN;  Surgeon: Sherren Kerns, MD;  Location: [facility] OR;  Service: Vascular;  Laterality: Right;  . Insertion of dialysis catheter Right 08/27/2014    Procedure: INSERTION OF DIALYSIS CATHETER RIGHT INTERNAL JUGULAR VEIN;  Surgeon: Pryor Ochoa, MD;  Location: [facility] OR;  Service: Vascular;  Laterality: Right;  . Esophagogastroduodenoscopy (egd) with propofol N/A 08/29/2014    Procedure: ESOPHAGOGASTRODUODENOSCOPY (EGD) WITH PROPOFOL;  Surgeon: Charlott Rakes, MD;  Location: [facility] ENDOSCOPY;  Service: Endoscopy;  Laterality: N/A;   Social History:  reports that he quit smoking about 8 years ago. He has never used smokeless tobacco. He reports that he does not drink alcohol or use illicit drugs.  No Known Allergies  Family History  Problem Relation Age of Onset  . Diabetes Mother   . Hypertension Mother   . Diabetes Father   . Hypertension Father      Prior to Admission medications   Medication Sig Start Date End Date Taking? Authorizing Provider  acetaminophen (TYLENOL) 325 MG tablet Take 2 tablets (650 mg total) by mouth every 6 (six) hours as needed for mild pain, moderate pain, fever or headache (or Fever >/= 101). 10/21/14  Yes Elease Etienne, MD  aspirin  EC 325 MG tablet Take 325 mg by mouth daily.   Yes Historical Provider, MD  atorvastatin (LIPITOR) 40 MG tablet Take 40 mg by mouth daily.    Yes Historical Provider, MD  carvedilol (COREG CR) 10 MG 24 hr capsule Take 1 capsule (10 mg total) by mouth daily. 10/21/14  Yes Elease Etienne, MD  cinacalcet (SENSIPAR) 30 MG tablet Take 6 tablets (180 mg total) by mouth daily with supper. 10/21/14  Yes Elease Etienne, MD  clopidogrel (PLAVIX) 75 MG tablet Take 1 tablet (75 mg total) by mouth every other day. 09/08/14  Yes Marinda Elk, MD  feeding  supplement (BOOST / RESOURCE BREEZE) LIQD Take 1 Container by mouth 3 (three) times daily between meals. 10/21/14  Yes Elease Etienne, MD  gabapentin (NEURONTIN) 100 MG capsule Take 100 mg by mouth daily.  07/17/14  Yes Historical Provider, MD  lanthanum (FOSRENOL) 1000 MG chewable tablet Chew 1 tablet (1,000 mg total) by mouth 3 (three) times daily with meals. 08/30/14  Yes Marinda Elk, MD  lisinopril (PRINIVIL,ZESTRIL) 5 MG tablet Take 5 mg by mouth daily.  07/17/14  Yes Historical Provider, MD  multivitamin (RENA-VIT) TABS tablet Take 1 tablet by mouth at bedtime. 10/21/14  Yes Elease Etienne, MD  NOVOLIN 70/30 (70-30) 100 UNIT/ML injection Inject 15 Units into the skin daily.  09/17/14  Yes Historical Provider, MD  oxyCODONE-acetaminophen (PERCOCET/ROXICET) 5-325 MG tablet Take 1 tablet by mouth every 8 (eight) hours as needed for severe pain. 10/21/14  Yes Elease Etienne, MD  pantoprazole (PROTONIX) 40 MG tablet Take 1 tablet (40 mg total) by mouth 2 (two) times daily. 10/21/14  Yes Elease Etienne, MD   Physical Exam: Filed Vitals:   12/10/14 2315 12/10/14 2330  BP: 131/64 127/62  Pulse: 94 94  Temp:    Resp: 32 31    BP 127/62 mmHg  Pulse 94  Temp(Src) 101.9 F (38.8 C) (Rectal)  Resp 31  Wt 58.7 kg (129 lb 6.6 oz)  SpO2 100%  General Appearance:    Lethargic, minimally responsive, no distress, appears stated age  Head:    Normocephalic, atraumatic  Eyes:    PERRL, EOMI, sclera non-icteric        Nose:   Nares without drainage or epistaxis. Mucosa, turbinates normal  Throat:   Moist mucous membranes. Oropharynx without erythema or exudate.  Neck:   Supple. No carotid bruits.  No thyromegaly.  No lymphadenopathy.   Back:     No CVA tenderness, no spinal tenderness  Lungs:     Clear to auscultation bilaterally, without wheezes, rhonchi or rales  Chest wall:    No tenderness to palpitation  Heart:    Regular rate and rhythm without murmurs, gallops, rubs  Abdomen:      Soft, non-tender, nondistended, normal bowel sounds, no organomegaly  Genitalia:    deferred  Rectal:    deferred  Extremities:   No clubbing, cyanosis or edema.  Pulses:   2+ and symmetric all extremities  Skin:   Skin color, texture, turgor normal, no rashes or lesions  Lymph nodes:   Cervical, supraclavicular, and axillary nodes normal  Neurologic:   CNII-XII intact. Normal strength, sensation and reflexes      throughout    Labs on Admission:  Basic Metabolic Panel:  Recent Labs Lab 12/10/14 2232  NA 137  K 3.8  CL 101  CO2 26  GLUCOSE 159*  BUN 29*  CREATININE 3.71*  CALCIUM 12.2*   Liver Function Tests:  Recent Labs Lab 12/10/14 2232  AST 24  ALT 10*  ALKPHOS 119  BILITOT 0.7  PROT 6.6  ALBUMIN 1.4*   No results for input(s): LIPASE, AMYLASE in the last 168 hours. No results for input(s): AMMONIA in the last 168 hours. CBC:  Recent Labs Lab 12/10/14 1845  WBC 18.8*  NEUTROABS 16.0*  HGB 9.6*  HCT 31.1*  MCV 85.9  PLT 125*   Cardiac Enzymes: No results for input(s): CKTOTAL, CKMB, CKMBINDEX, TROPONINI in the last 168 hours.  BNP (last 3 results) No results for input(s): PROBNP in the last 8760 hours. CBG: No results for input(s): GLUCAP in the last 168 hours.  Radiological Exams on Admission: Dg Chest Portable 1 View  12/10/2014  CLINICAL DATA:  Cough. End-stage renal disease on dialysis. Cirrhosis. EXAM: PORTABLE CHEST 1 VIEW COMPARISON:  10/15/2014 FINDINGS: Patient is partially rotated to the right. Ill-defined airspace opacity is seen in the peripheral right lower lung. Left lung remains clear. Heart size is stable allowing for differences in patient positioning. No pneumothorax or pleural effusion visualized. IMPRESSION: Suboptimal due to positioning. Peripheral right lower lung airspace opacity, suspicious for infiltrate/pneumonia. Electronically Signed   By: Myles Rosenthal M.D.   On: 12/10/2014 19:04    EKG: Independently  reviewed.  Assessment/Plan Principal Problem:   HCAP (healthcare-associated pneumonia) Active Problems:   DM type 2 causing ESRD (HCC)   Sepsis (HCC)   ESRD on hemodialysis (HCC)   Hypercalcemia   1. HCAP causing sepsis - 1. Got IVF bolus in ED 2. IVF going at 125 cc/hr now 3. Tachycardia resolved 4. Trend WBC with repeat CBC in am 5. Empiric zosyn and vanc (his ESBL ecoli was sensitive to zosyn). 6. Cultures pending 7. PNA pathway 8. Tele monitor and continuous pulse ox in the SDU given his poor mental status. 9. Tylenol for fever, SUPP has been ordered if needed (seems likely given poor mental status). 2. DM2 - SSI Q4H for now 3. ESRD - left message with dialysis voicemail for routine consult for possible dialysis tomorrow on ESRD patient. 4. Hypercalcemia - continue cinacalcet, IVF, repeat BMP in AM.    Code Status: Full Code  Family Communication: Family at bedside Disposition Plan: Admit to inpatient   Time spent: 70 min  GARDNER, JARED M. [facility] Pager [phone]  If 7AM-7PM, please contact the day team taking care of the patient [email or web address] Password [email or web address] 12/10/2014, 11:35 PM

## 2014-12-10 NOTE — ED Notes (Signed)
CMP hemolyzed, Provider aware; phlebotomy to redraw labs

## 2014-12-10 NOTE — ED Provider Notes (Signed)
CSN: [id number]     Arrival date & time 12/10/14  1808 History   First MD Initiated Contact with Patient 12/10/14 1810     Chief Complaint  Patient presents with  . Code Sepsis     (Consider location/radiation/quality/duration/timing/severity/associated sxs/prior Treatment) Patient is a 69 y.o. male presenting with fever. The history is provided by the EMS personnel. The history is limited by the condition of the patient.  Fever   Greg [name] is a 69 y.o. male with PMH significant for ESRD on dialysis (T, TH, Sat) and has been compliant, cirrhosis, alcohol abuse, stroke (left sided deficits), HTN, DM, hep c, CAD,  who presents with fever per EMS.  Temp 103 and RR 24.  Patient is nonverbal and occasionally grunts, baseline--level 5 caveat.  Per EMS, patient with foul smelling urine for the past couple of days as well as intermittent cough.  Patient is not ambulatory.   Past Medical History  Diagnosis Date  . ESRD on hemodialysis (HCC)     Started HD around 2006.   Marland Kitchen Cirrhosis (HCC)   . Alcohol abuse   . Stroke [facility])     left sided deficits  . Hypertension   . Diabetes mellitus     type 2   . Hepatitis     hep c  . Constipation   . Coronary artery disease   . Hypercholesterolemia    Past Surgical History  Procedure Laterality Date  . Cataract extraction w/ intraocular lens implant    . Av fistula placement Left 2006    upper arm  . Peripheral vascular catheterization N/A 06/06/2014    Procedure: Abdominal Aortogram;  Surgeon: Sherren Kerns, MD;  Location: [facility] INVASIVE CV LAB;  Service: Cardiovascular;  Laterality: N/A;  . Lower extremity angiogram Bilateral 06/06/2014    Procedure: Lower Extremity Angiogram;  Surgeon: Sherren Kerns, MD;  Location: [facility] INVASIVE CV LAB;  Service: Cardiovascular;  Laterality: Bilateral;  . Peripheral vascular catheterization Left 06/06/2014    Procedure: Peripheral Vascular Intervention;  Surgeon: Sherren Kerns, MD;  Location: [facility] INVASIVE  CV LAB;  Service: Cardiovascular;  Laterality: Left;  pta drug coated x3  . Revison of arteriovenous fistula Left 08/04/2014    Procedure: PLICATION OF  LEFT UPPER ARM ARTERIOVENOUS FISTULA;  Surgeon: Sherren Kerns, MD;  Location: [facility] OR;  Service: Vascular;  Laterality: Left;  . Insertion of dialysis catheter Right 08/04/2014    Procedure: INSERTION OF DIALYSIS CATHETER RIGHT INTERNAL JUGULAR VEIN;  Surgeon: Sherren Kerns, MD;  Location: [facility] OR;  Service: Vascular;  Laterality: Right;  . Insertion of dialysis catheter Right 08/27/2014    Procedure: INSERTION OF DIALYSIS CATHETER RIGHT INTERNAL JUGULAR VEIN;  Surgeon: Pryor Ochoa, MD;  Location: [facility] OR;  Service: Vascular;  Laterality: Right;  . Esophagogastroduodenoscopy (egd) with propofol N/A 08/29/2014    Procedure: ESOPHAGOGASTRODUODENOSCOPY (EGD) WITH PROPOFOL;  Surgeon: Charlott Rakes, MD;  Location: [facility] ENDOSCOPY;  Service: Endoscopy;  Laterality: N/A;   Family History  Problem Relation Age of Onset  . Diabetes Mother   . Hypertension Mother   . Diabetes Father   . Hypertension Father    Social History  Substance Use Topics  . Smoking status: Former Smoker    Quit date: 01/10/2006  . Smokeless tobacco: Never Used  . Alcohol Use: No     Comment: Former    Review of Systems  Unable to perform ROS: Patient nonverbal      Allergies  Review of patient's  allergies indicates no known allergies.  Home Medications   Prior to Admission medications   Medication Sig Start Date End Date Taking? Authorizing Provider  acetaminophen (TYLENOL) 325 MG tablet Take 2 tablets (650 mg total) by mouth every 6 (six) hours as needed for mild pain, moderate pain, fever or headache (or Fever >/= 101). 10/21/14  Yes Elease Etienne, MD  aspirin EC 325 MG tablet Take 325 mg by mouth daily.   Yes Historical Provider, MD  atorvastatin (LIPITOR) 40 MG tablet Take 40 mg by mouth daily.    Yes Historical Provider, MD  carvedilol (COREG CR) 10 MG  24 hr capsule Take 1 capsule (10 mg total) by mouth daily. 10/21/14  Yes Elease Etienne, MD  cinacalcet (SENSIPAR) 30 MG tablet Take 6 tablets (180 mg total) by mouth daily with supper. 10/21/14  Yes Elease Etienne, MD  clopidogrel (PLAVIX) 75 MG tablet Take 1 tablet (75 mg total) by mouth every other day. 09/08/14  Yes Marinda Elk, MD  feeding supplement (BOOST / RESOURCE BREEZE) LIQD Take 1 Container by mouth 3 (three) times daily between meals. 10/21/14  Yes Elease Etienne, MD  gabapentin (NEURONTIN) 100 MG capsule Take 100 mg by mouth daily.  07/17/14  Yes Historical Provider, MD  lanthanum (FOSRENOL) 1000 MG chewable tablet Chew 1 tablet (1,000 mg total) by mouth 3 (three) times daily with meals. 08/30/14  Yes Marinda Elk, MD  lisinopril (PRINIVIL,ZESTRIL) 5 MG tablet Take 5 mg by mouth daily.  07/17/14  Yes Historical Provider, MD  multivitamin (RENA-VIT) TABS tablet Take 1 tablet by mouth at bedtime. 10/21/14  Yes Elease Etienne, MD  NOVOLIN 70/30 (70-30) 100 UNIT/ML injection Inject 15 Units into the skin daily.  09/17/14  Yes Historical Provider, MD  oxyCODONE-acetaminophen (PERCOCET/ROXICET) 5-325 MG tablet Take 1 tablet by mouth every 8 (eight) hours as needed for severe pain. 10/21/14  Yes Elease Etienne, MD  pantoprazole (PROTONIX) 40 MG tablet Take 1 tablet (40 mg total) by mouth 2 (two) times daily. 10/21/14  Yes Elease Etienne, MD   BP 131/64 mmHg  Pulse 94  Temp(Src) 101.9 F (38.8 C) (Rectal)  Resp 32  Wt 58.7 kg  SpO2 99% Physical Exam  Constitutional: He appears well-developed and well-nourished. He has a sickly appearance. He appears ill.  Patient is curled up with eyes closed occasionally moaning.   HENT:  Head: Normocephalic and atraumatic.  Mouth/Throat: Oropharynx is clear and moist.  Eyes: Conjunctivae are normal. Pupils are equal, round, and reactive to light.  Neck: Normal range of motion. Neck supple.  Cardiovascular: Regular rhythm and  normal heart sounds.  Tachycardia present.   No murmur heard. Pulmonary/Chest: Effort normal and breath sounds normal. No accessory muscle usage or stridor. Tachypnea noted. No respiratory distress. He has no wheezes. He has no rhonchi. He has no rales.  Abdominal: Soft. Bowel sounds are normal. He exhibits no distension. There is no tenderness. There is no rebound and no guarding.  Musculoskeletal: Normal range of motion.  Lymphadenopathy:    He has no cervical adenopathy.  Neurological: He is alert.  Patient is nonverbal and will moan in response to pain.  Skin: Skin is warm and dry.  Psychiatric: He has a normal mood and affect. His behavior is normal.    ED Course  Procedures (including critical care time) Labs Review Labs Reviewed  CBC WITH DIFFERENTIAL/PLATELET - Abnormal; Notable for the following:    WBC 18.8 (*)  RBC 3.62 (*)    Hemoglobin 9.6 (*)    HCT 31.1 (*)    RDW 18.5 (*)    Platelets 125 (*)    Neutro Abs 16.0 (*)    All other components within normal limits  COMPREHENSIVE METABOLIC PANEL - Abnormal; Notable for the following:    Glucose, Bld 159 (*)    BUN 29 (*)    Creatinine, Ser 3.71 (*)    Calcium 12.2 (*)    Albumin 1.4 (*)    ALT 10 (*)    GFR calc non Af Amer 15 (*)    GFR calc Af Amer 18 (*)    All other components within normal limits  CULTURE, BLOOD (ROUTINE X 2)  CULTURE, BLOOD (ROUTINE X 2)  URINE CULTURE  URINALYSIS, ROUTINE W REFLEX MICROSCOPIC (NOT AT [facility])  I-STAT CG4 LACTIC ACID, ED  I-STAT CG4 LACTIC ACID, ED    Imaging Review Dg Chest Portable 1 View  12/10/2014  CLINICAL DATA:  Cough. End-stage renal disease on dialysis. Cirrhosis. EXAM: PORTABLE CHEST 1 VIEW COMPARISON:  10/15/2014 FINDINGS: Patient is partially rotated to the right. Ill-defined airspace opacity is seen in the peripheral right lower lung. Left lung remains clear. Heart size is stable allowing for differences in patient positioning. No pneumothorax or pleural  effusion visualized. IMPRESSION: Suboptimal due to positioning. Peripheral right lower lung airspace opacity, suspicious for infiltrate/pneumonia. Electronically Signed   By: Myles Rosenthal M.D.   On: 12/10/2014 19:04   I have personally reviewed and evaluated these images and lab results as part of my medical decision-making.   EKG Interpretation None      MDM   Final diagnoses:  Sepsis, due to unspecified organism [facility])    Patient presents via EMS with fever and foul smelling urine x couple days.  Patient does not speak english, and patient is nonverbal.  He is febrile (101.9), normotensive (149/82), tachycardic, tachpneic, but not hypoxic at 99% on RA.  Will start fluids and zosyn and sepsis workup.    -lactic acid 1.97 -CBC shows leukocytosis of 18.8 and hgb of 9.6, which appears to be baseline. -CXR shows RLL airspace opacity, suspicious for infiltrate/PNA.  Will add vancomycin to abx regimen. -CMP pending shows potassium 3.8 and Cr 3.71  Case has been discussed with and seen by Dr. Denton Lank who agrees with the above plan for admission.    Cheri Fowler, PA-C 12/10/14 2336  Cathren Laine, MD 12/12/14 [phone]

## 2014-12-10 NOTE — ED Notes (Signed)
Pt to ED from home via [facility] c/o altered mental status. Family reports increased groaning at home Hx of UTIs, and has had a cough x 3-4 days. Pt is a dialysis patient, has not missed any treatments per family. Family also reports foul smelling urine

## 2014-12-10 NOTE — Progress Notes (Addendum)
ANTIBIOTIC CONSULT NOTE - INITIAL  Pharmacy Consult for Zosyn Indication: rule out sepsis  No Known Allergies  Patient Measurements: Weight: 129 lb 6.6 oz (58.7 kg)  Vital Signs: BP: 149/82 mmHg (11/30 1813) Pulse Rate: 110 (11/30 1813) Intake/Output from previous day:   Intake/Output from this shift:    Labs: No results for input(s): WBC, HGB, PLT, LABCREA, CREATININE in the last 72 hours. CrCl cannot be calculated (Patient has no serum creatinine result on file.). No results for input(s): VANCOTROUGH, VANCOPEAK, VANCORANDOM, GENTTROUGH, GENTPEAK, GENTRANDOM, TOBRATROUGH, TOBRAPEAK, TOBRARND, AMIKACINPEAK, AMIKACINTROU, AMIKACIN in the last 72 hours.   Microbiology: No results found for this or any previous visit (from the past 720 hour(s)).  Medical History: Past Medical History  Diagnosis Date  . ESRD on hemodialysis (HCC)     Started HD around 2006.   Marland Kitchen Cirrhosis (HCC)   . Alcohol abuse   . Stroke [facility])     left sided deficits  . Hypertension   . Diabetes mellitus     type 2   . Hepatitis     hep c  . Constipation   . Coronary artery disease   . Hypercholesterolemia     Assessment: 69 yo m with ESRD on HD presenting to the ED on 11/30 with AMS.  Pharmacy is consulted to dose Zosyn for r/o sepsis. Wbc 18.8, temp 101.9.  Zosyn 11/30 >>  11/30 BCx: sent 11/30 UCx: sent  Goal of Therapy:  Proper dosing of antibiotics  Plan:  Zosyn 3.375 gm IV x 1 load then Zosyn 2.25 gm IV q8h  F/u HD schedule, cbc, cx, clinical course  Cassie L. Roseanne Reno, PharmD PGY2 Infectious Diseases Pharmacy Resident Pager: [phone] 12/10/2014 6:29 PM  Addendum: pharmacy also consulted to start vancomycin for pneumonia.    Vancomycin 1,250 mg IV load x 1 F/u HD schedule/plans for maintenance dose Monitor cultures, clinical course  Cassie L. Roseanne Reno, PharmD PGY2 Infectious Diseases Pharmacy Resident Pager: [phone] 12/10/2014 7:31 PM   Will plan 500mg  IV vancomycin  post-HD TTS  Lysle Pearl, PharmD, BCPS Pager # [phone] 12/11/2014 12:52 PM

## 2014-12-11 DIAGNOSIS — Z992 Dependence on renal dialysis: Secondary | ICD-10-CM

## 2014-12-11 DIAGNOSIS — J189 Pneumonia, unspecified organism: Secondary | ICD-10-CM

## 2014-12-11 DIAGNOSIS — A4151 Sepsis due to Escherichia coli [E. coli]: Principal | ICD-10-CM

## 2014-12-11 DIAGNOSIS — E1122 Type 2 diabetes mellitus with diabetic chronic kidney disease: Secondary | ICD-10-CM

## 2014-12-11 DIAGNOSIS — N186 End stage renal disease: Secondary | ICD-10-CM

## 2014-12-11 LAB — BASIC METABOLIC PANEL
Anion gap: 11 (ref 5–15)
BUN: 31 mg/dL — ABNORMAL HIGH (ref 6–20)
CALCIUM: 12.3 mg/dL — AB (ref 8.9–10.3)
CO2: 26 mmol/L (ref 22–32)
Chloride: 102 mmol/L (ref 101–111)
Creatinine, Ser: 3.88 mg/dL — ABNORMAL HIGH (ref 0.61–1.24)
GFR, EST AFRICAN AMERICAN: 17 mL/min — AB (ref >60)
GFR, EST NON AFRICAN AMERICAN: 15 mL/min — AB (ref >60)
Glucose, Bld: 126 mg/dL — ABNORMAL HIGH (ref 65–99)
Potassium: 4.1 mmol/L (ref 3.5–5.1)
SODIUM: 139 mmol/L (ref 135–145)

## 2014-12-11 LAB — URINALYSIS, ROUTINE W REFLEX MICROSCOPIC
GLUCOSE, UA: 250 mg/dL — AB
KETONES UR: 15 mg/dL — AB
Nitrite: POSITIVE — AB
PH: 6.5 (ref 5.0–8.0)
Protein, ur: >300 mg/dL — AB
SPECIFIC GRAVITY, URINE: 1.025 (ref 1.005–1.030)

## 2014-12-11 LAB — CBC
HCT: 31.2 % — ABNORMAL LOW (ref 39.0–52.0)
HEMOGLOBIN: 9.6 g/dL — AB (ref 13.0–17.0)
MCH: 26.6 pg (ref 26.0–34.0)
MCHC: 30.8 g/dL (ref 30.0–36.0)
MCV: 86.4 fL (ref 78.0–100.0)
PLATELETS: 132 10*3/uL — AB (ref 150–400)
RBC: 3.61 MIL/uL — AB (ref 4.22–5.81)
RDW: 18.5 % — ABNORMAL HIGH (ref 11.5–15.5)
WBC: 18.4 10*3/uL — ABNORMAL HIGH (ref 4.0–10.5)

## 2014-12-11 LAB — URINE MICROSCOPIC-ADD ON

## 2014-12-11 LAB — CBG MONITORING, ED
GLUCOSE-CAPILLARY: 108 mg/dL — AB (ref 65–99)
Glucose-Capillary: 116 mg/dL — ABNORMAL HIGH (ref 65–99)
Glucose-Capillary: 121 mg/dL — ABNORMAL HIGH (ref 65–99)
Glucose-Capillary: 115 mg/dL — ABNORMAL HIGH (ref 65–99)

## 2014-12-11 LAB — STREP PNEUMONIAE URINARY ANTIGEN: STREP PNEUMO URINARY ANTIGEN: NEGATIVE

## 2014-12-11 LAB — TROPONIN I
TROPONIN I: 0.3 ng/mL — AB (ref <0.031)
TROPONIN I: 0.24 ng/mL — AB (ref <0.031)
Troponin I: 0.84 ng/mL (ref <0.031)

## 2014-12-11 LAB — HIV ANTIBODY (ROUTINE TESTING W REFLEX): HIV Screen 4th Generation wRfx: NONREACTIVE

## 2014-12-11 LAB — MRSA PCR SCREENING: MRSA BY PCR: NEGATIVE

## 2014-12-11 LAB — GLUCOSE, CAPILLARY: Glucose-Capillary: 98 mg/dL (ref 65–99)

## 2014-12-11 LAB — I-STAT CG4 LACTIC ACID, ED: LACTIC ACID, VENOUS: 1.35 mmol/L (ref 0.5–2.0)

## 2014-12-11 MED ORDER — VANCOMYCIN HCL 500 MG IV SOLR
500.0000 mg | INTRAVENOUS | Status: DC
  Filled 2014-12-11 (×2): qty 500

## 2014-12-11 MED ORDER — CINACALCET HCL 30 MG PO TABS: 180.0000 mg | ORAL_TABLET | Freq: Every day | ORAL | Status: DC

## 2014-12-11 MED ORDER — PIPERACILLIN-TAZOBACTAM IN DEX 2-0.25 GM/50ML IV SOLN
2.2500 g | Freq: Three times a day (TID) | INTRAVENOUS | Status: DC
  Administered 2014-12-11 – 2014-12-14 (×10): 2.25 g via INTRAVENOUS
  Filled 2014-12-11 (×19): qty 50

## 2014-12-11 MED ORDER — SODIUM CHLORIDE 0.9 % IV SOLN: 62.5000 mg | INTRAVENOUS | Status: DC

## 2014-12-11 NOTE — Progress Notes (Signed)
CRITICAL VALUE ALERT  Critical value received:  Blood cultures positive in aerobic and anaerobic tubes for gram positive cocci in clusters.  Date of notification:  12/11/2014   Time of notification:  1932  Critical value read back:Yes.    Nurse who received alert: Reap, Randon Goldsmith   MD notified (1st page):  TRH  Time of first page:  8:14 PM   MD notified (2nd page):  Time of second page:  Responding MD:    Time MD responded:

## 2014-12-11 NOTE — Progress Notes (Signed)
[facility] PROGRESS NOTE  Greg [name] ZOX:[id number] DOB: 15-Apr-1945 DOA: 12/10/2014 PCP: Rinaldo Cloud, MD  Assessment/Plan: 1. Sepsis -due to UTI/possible HCAP -continue Empiric Vanc and Zosyn -FU urine and blood Cx -prior ESBL Ecoli UTI sensitive to Zosyn -stop IVF, lactic acid and BP reassuring  2. ESRD on HD TTS -renal notified  3. H/o CVA with L hemiplegia/aphasia -continue ASA/plavix/statin  4. DM -SSI  5. Elevated Troponin -due to demand, ESRD, no chest pain  6. Hypercalcemia/secondary hyperparathyroidism -on sensipar, per Renal  DVT proph: Hep SQ  Code Status: DNR, after d/w son Greg [name] Family Communication: none at bedside, called and d/w son Disposition Plan: inpatient   Consultants:  Renal  HPI/Subjective: Remains confused  Objective: Filed Vitals:   12/11/14 1045 12/11/14 1115  BP: 120/56 129/59  Pulse: 85 86  Temp:    Resp: 13 15    Intake/Output Summary (Last 24 hours) at 12/11/14 1227 Last data filed at 12/11/14 8119  Gross per 24 hour  Intake    500 ml  Output    200 ml  Net    300 ml   Filed Weights   12/10/14 1825  Weight: 58.7 kg (129 lb 6.6 oz)    Exam:   General:  Somnolent, arousible, confused  Cardiovascular: S1S2/RRR  Respiratory: ronchi at bases  Abdomen: soft, NT, BS present  Musculoskeletal: LUE and LLE contracted  Neuro: obtunded, L hemiplegia   Data Reviewed: Basic Metabolic Panel:  Recent Labs Lab 12/10/14 2232 12/11/14 0515  NA 137 139  K 3.8 4.1  CL 101 102  CO2 26 26  GLUCOSE 159* 126*  BUN 29* 31*  CREATININE 3.71* 3.88*  CALCIUM 12.2* 12.3*   Liver Function Tests:  Recent Labs Lab 12/10/14 2232  AST 24  ALT 10*  ALKPHOS 119  BILITOT 0.7  PROT 6.6  ALBUMIN 1.4*   No results for input(s): LIPASE, AMYLASE in the last 168 hours. No results for input(s): AMMONIA in the last 168 hours. CBC:  Recent Labs Lab 12/10/14 1845 12/11/14 0515  WBC 18.8* 18.4*  NEUTROABS  16.0*  --   HGB 9.6* 9.6*  HCT 31.1* 31.2*  MCV 85.9 86.4  PLT 125* 132*   Cardiac Enzymes:  Recent Labs Lab 12/11/14 0109 12/11/14 0515  TROPONINI 0.30* 0.84*   BNP (last 3 results) No results for input(s): BNP in the last 8760 hours.  ProBNP (last 3 results) No results for input(s): PROBNP in the last 8760 hours.  CBG:  Recent Labs Lab 12/11/14 0514 12/11/14 0557 12/11/14 0905  GLUCAP 116* 121* 115*    No results found for this or any previous visit (from the past 240 hour(s)).   Studies: Dg Chest Portable 1 View  12/10/2014  CLINICAL DATA:  Cough. End-stage renal disease on dialysis. Cirrhosis. EXAM: PORTABLE CHEST 1 VIEW COMPARISON:  10/15/2014 FINDINGS: Patient is partially rotated to the right. Ill-defined airspace opacity is seen in the peripheral right lower lung. Left lung remains clear. Heart size is stable allowing for differences in patient positioning. No pneumothorax or pleural effusion visualized. IMPRESSION: Suboptimal due to positioning. Peripheral right lower lung airspace opacity, suspicious for infiltrate/pneumonia. Electronically Signed   By: Myles Rosenthal M.D.   On: 12/10/2014 19:04    Scheduled Meds: . aspirin EC  325 mg Oral Daily  . atorvastatin  40 mg Oral Daily  . carvedilol  10 mg Oral Daily  . cinacalcet  180 mg Oral Q supper  . clopidogrel  75 mg  Oral QODAY  . feeding supplement  1 Container Oral TID BM  . gabapentin  100 mg Oral Daily  . heparin  5,000 Units Subcutaneous 3 times per day  . insulin aspart  0-9 Units Subcutaneous 6 times per day  . multivitamin  1 tablet Oral QHS  . pantoprazole  40 mg Oral BID   Continuous Infusions: . piperacillin-tazobactam (ZOSYN)  IV Stopped (12/11/14 0410)   Antibiotics Given (last 72 hours)    None      Principal Problem:   HCAP (healthcare-associated pneumonia) Active Problems:   DM type 2 causing ESRD (HCC)   Sepsis (HCC)   ESRD on hemodialysis (HCC)   Hypercalcemia    Time  spent:    [facility]  [facility] Pager [phone]. If 7PM-7AM, please contact night-coverage at [email or web address], password [facility] 12/11/2014, 12:27 PM  LOS: 1 day

## 2014-12-11 NOTE — ED Notes (Signed)
Pt CBG, 115.

## 2014-12-11 NOTE — ED Notes (Signed)
Son Greg [name]) cell # [phone]

## 2014-12-11 NOTE — ED Notes (Signed)
cbg is 116.

## 2014-12-11 NOTE — ED Notes (Signed)
CBG 108. 

## 2014-12-11 NOTE — ED Notes (Signed)
Dr. Jomarie Longs MD at bedside.

## 2014-12-11 NOTE — ED Notes (Signed)
Pt noted to have pus colored urine. Dr.Gardner informed. In and out cath ordered.

## 2014-12-11 NOTE — ED Notes (Signed)
Please call Kurtis Bushman (son) @ [phone] when done with dialysis

## 2014-12-11 NOTE — ED Notes (Addendum)
Dialysis called, ready for pt in [place].  Report given.

## 2014-12-11 NOTE — Consult Note (Signed)
[facility] Renal Consultation Note  Indication for Consultation:  Management of ESRD/hemodialysis; anemia, hypertension/volume and secondary hyperparathyroidism  HPI: Greg [name] is a 69 y.o. male admitted with PNA/ UTI. Per son had AMS past day("not talking much") and fever to 103 at home with several days of dry cough.and foul smelling urine . He has ho  cva with Aphasia/ Left side  Weakness. In ER temp 101.9 with CXR  Showing PNA / wbc 18.4 k 4.1 Ca 12.3 /alb 1.4 / UA wbc TNTC/Turbid/ appearing septic with Tachycardia VF bolus in ER and resolved to sr now with BP 126/72 now.Discussed with son in ER ,son now desires DNR status for Greg [name] and continue HD on schedule for now.      Past Medical History  Diagnosis Date  . ESRD on hemodialysis ([place])     Started HD around 2006.   Marland Kitchen Cirrhosis ([place])   . Alcohol abuse   . Stroke [facility])     left sided deficits  . Hypertension   . Diabetes mellitus     type 2   . Hepatitis     hep c  . Constipation   . Coronary artery disease   . Hypercholesterolemia     Past Surgical History  Procedure Laterality Date  . Cataract extraction w/ intraocular lens implant    . Av fistula placement Left 2006    upper arm  . Peripheral vascular catheterization N/A 06/06/2014    Procedure: Abdominal Aortogram;  Surgeon: Elam Dutch, MD;  Location: [place] CV LAB;  Service: Cardiovascular;  Laterality: N/A;  . Lower extremity angiogram Bilateral 06/06/2014    Procedure: Lower Extremity Angiogram;  Surgeon: Elam Dutch, MD;  Location: [place] CV LAB;  Service: Cardiovascular;  Laterality: Bilateral;  . Peripheral vascular catheterization Left 06/06/2014    Procedure: Peripheral Vascular Intervention;  Surgeon: Elam Dutch, MD;  Location: [place] CV LAB;  Service: Cardiovascular;  Laterality: Left;  pta drug coated x3  . Revison of arteriovenous fistula Left 6/38/7564    Procedure: PLICATION OF  LEFT UPPER ARM  ARTERIOVENOUS FISTULA;  Surgeon: Elam Dutch, MD;  Location: [place];  Service: Vascular;  Laterality: Left;  . Insertion of dialysis catheter Right 08/04/2014    Procedure: INSERTION OF DIALYSIS CATHETER RIGHT INTERNAL JUGULAR VEIN;  Surgeon: Elam Dutch, MD;  Location: [place];  Service: Vascular;  Laterality: Right;  . Insertion of dialysis catheter Right 08/27/2014    Procedure: INSERTION OF DIALYSIS CATHETER RIGHT INTERNAL JUGULAR VEIN;  Surgeon: Mal Misty, MD;  Location: [place];  Service: Vascular;  Laterality: Right;  . Esophagogastroduodenoscopy (egd) with propofol N/A 08/29/2014    Procedure: ESOPHAGOGASTRODUODENOSCOPY (EGD) WITH PROPOFOL;  Surgeon: Wilford Corner, MD;  Location: [facility] ENDOSCOPY;  Service: Endoscopy;  Laterality: N/A;      Family History  Problem Relation Age of Onset  . Diabetes Mother   . Hypertension Mother   . Diabetes Father   . Hypertension Father       reports that he quit smoking about 8 years ago. He has never used smokeless tobacco. He reports that he does not drink alcohol or use illicit drugs.  No Known Allergies  Prior to Admission medications   Medication Sig Start Date End Date Taking? Authorizing Provider  acetaminophen (TYLENOL) 325 MG tablet Take 2 tablets (650 mg total) by mouth every 6 (six) hours as needed for mild pain, moderate pain, fever or headache (or Fever >/= 101). 10/21/14  Yes Modena Jansky, MD  aspirin EC 325 MG tablet Take 325 mg by mouth daily.   Yes Historical Provider, MD  atorvastatin (LIPITOR) 40 MG tablet Take 40 mg by mouth daily.    Yes Historical Provider, MD  carvedilol (COREG CR) 10 MG 24 hr capsule Take 1 capsule (10 mg total) by mouth daily. 10/21/14  Yes Modena Jansky, MD  cinacalcet (SENSIPAR) 30 MG tablet Take 6 tablets (180 mg total) by mouth daily with supper. 10/21/14  Yes Modena Jansky, MD  clopidogrel (PLAVIX) 75 MG tablet Take 1 tablet (75 mg total) by mouth every other day. 09/08/14  Yes  Charlynne Cousins, MD  feeding supplement (BOOST / RESOURCE BREEZE) LIQD Take 1 Container by mouth 3 (three) times daily between meals. 10/21/14  Yes Modena Jansky, MD  gabapentin (NEURONTIN) 100 MG capsule Take 100 mg by mouth daily.  07/17/14  Yes Historical Provider, MD  lanthanum (FOSRENOL) 1000 MG chewable tablet Chew 1 tablet (1,000 mg total) by mouth 3 (three) times daily with meals. 08/30/14  Yes Charlynne Cousins, MD  lisinopril (PRINIVIL,ZESTRIL) 5 MG tablet Take 5 mg by mouth daily.  07/17/14  Yes Historical Provider, MD  multivitamin (RENA-VIT) TABS tablet Take 1 tablet by mouth at bedtime. 10/21/14  Yes Modena Jansky, MD  NOVOLIN 70/30 (70-30) 100 UNIT/ML injection Inject 15 Units into the skin daily.  09/17/14  Yes Historical Provider, MD  oxyCODONE-acetaminophen (PERCOCET/ROXICET) 5-325 MG tablet Take 1 tablet by mouth every 8 (eight) hours as needed for severe pain. 10/21/14  Yes Modena Jansky, MD  pantoprazole (PROTONIX) 40 MG tablet Take 1 tablet (40 mg total) by mouth 2 (two) times daily. 10/21/14  Yes Modena Jansky, MD     Anti-infectives    Start     Dose/Rate Route Frequency Ordered Stop   12/11/14 1600  vancomycin (VANCOCIN) 500 mg in sodium chloride 0.9 % 100 mL IVPB     500 mg 100 mL/hr over 60 Minutes Intravenous Every T-Th-Sa (Hemodialysis) 12/11/14 1251     12/11/14 0300  piperacillin-tazobactam (ZOSYN) IVPB 2.25 g     2.25 g 100 mL/hr over 30 Minutes Intravenous 3 times per day 12/11/14 0126     12/11/14 0100  piperacillin-tazobactam (ZOSYN) IVPB 2.25 g  Status:  Discontinued     2.25 g 100 mL/hr over 30 Minutes Intravenous Every 8 hours 12/10/14 1832 12/11/14 0126   12/10/14 2000  vancomycin (VANCOCIN) 1,250 mg in sodium chloride 0.9 % 250 mL IVPB     1,250 mg 166.7 mL/hr over 90 Minutes Intravenous  Once 12/10/14 1932 12/10/14 2227   12/10/14 1900  piperacillin-tazobactam (ZOSYN) IVPB 3.375 g     3.375 g 100 mL/hr over 30 Minutes Intravenous  Once  12/10/14 1832 12/10/14 2003      Results for orders placed or performed during the hospital encounter of 12/10/14 (from the past 48 hour(s))  I-Stat CG4 Lactic Acid, ED  (not at  [facility])     Status: None   Collection Time: 12/10/14  6:42 PM  Result Value Ref Range   Lactic Acid, Venous 1.97 0.5 - 2.0 mmol/L  CBC WITH DIFFERENTIAL     Status: Abnormal   Collection Time: 12/10/14  6:45 PM  Result Value Ref Range   WBC 18.8 (H) 4.0 - 10.5 K/uL   RBC 3.62 (L) 4.22 - 5.81 MIL/uL   Hemoglobin 9.6 (L) 13.0 - 17.0 g/dL   HCT 31.1 (L) 39.0 -  52.0 %   MCV 85.9 78.0 - 100.0 fL   MCH 26.5 26.0 - 34.0 pg   MCHC 30.9 30.0 - 36.0 g/dL   RDW 18.5 (H) 11.5 - 15.5 %   Platelets 125 (L) 150 - 400 K/uL    Comment: PLATELET COUNT CONFIRMED BY SMEAR   Neutrophils Relative % 85 %   Neutro Abs 16.0 (H) 1.7 - 7.7 K/uL   Lymphocytes Relative 10 %   Lymphs Abs 1.9 0.7 - 4.0 K/uL   Monocytes Relative 5 %   Monocytes Absolute 0.9 0.1 - 1.0 K/uL   Eosinophils Relative 0 %   Eosinophils Absolute 0.0 0.0 - 0.7 K/uL   Basophils Relative 0 %   Basophils Absolute 0.0 0.0 - 0.1 K/uL  Comprehensive metabolic panel     Status: Abnormal   Collection Time: 12/10/14 10:32 PM  Result Value Ref Range   Sodium 137 135 - 145 mmol/L   Potassium 3.8 3.5 - 5.1 mmol/L   Chloride 101 101 - 111 mmol/L   CO2 26 22 - 32 mmol/L   Glucose, Bld 159 (H) 65 - 99 mg/dL   BUN 29 (H) 6 - 20 mg/dL   Creatinine, Ser 3.71 (H) 0.61 - 1.24 mg/dL   Calcium 12.2 (H) 8.9 - 10.3 mg/dL   Total Protein 6.6 6.5 - 8.1 g/dL   Albumin 1.4 (L) 3.5 - 5.0 g/dL   AST 24 15 - 41 U/L   ALT 10 (L) 17 - 63 U/L   Alkaline Phosphatase 119 38 - 126 U/L   Total Bilirubin 0.7 0.3 - 1.2 mg/dL   GFR calc non Af Amer 15 (L) >60 mL/min   GFR calc Af Amer 18 (L) >60 mL/min    Comment: (NOTE) The eGFR has been calculated using the CKD EPI equation. This calculation has not been validated in all clinical situations. eGFR's persistently <60 mL/min signify  possible Chronic Kidney Disease.    Anion gap 10 5 - 15  I-Stat CG4 Lactic Acid, ED  (not at  [facility])     Status: None   Collection Time: 12/10/14 10:37 PM  Result Value Ref Range   Lactic Acid, Venous 1.58 0.5 - 2.0 mmol/L  Troponin I (q 6hr x 3)     Status: Abnormal   Collection Time: 12/11/14  1:09 AM  Result Value Ref Range   Troponin I 0.30 (H) <0.031 ng/mL    Comment:        PERSISTENTLY INCREASED TROPONIN VALUES IN THE RANGE OF 0.04-0.49 ng/mL CAN BE SEEN IN:       -UNSTABLE ANGINA       -CONGESTIVE HEART FAILURE       -MYOCARDITIS       -CHEST TRAUMA       -ARRYHTHMIAS       -LATE PRESENTING MYOCARDIAL INFARCTION       -COPD   CLINICAL FOLLOW-UP RECOMMENDED.   Urinalysis, Routine w reflex microscopic (not at [facility])     Status: Abnormal   Collection Time: 12/11/14  4:21 AM  Result Value Ref Range   Color, Urine BROWN (A) YELLOW    Comment: BIOCHEMICALS MAY BE AFFECTED BY COLOR   APPearance TURBID (A) CLEAR   Specific Gravity, Urine 1.025 1.005 - 1.030   pH 6.5 5.0 - 8.0   Glucose, UA 250 (A) NEGATIVE mg/dL   Hgb urine dipstick LARGE (A) NEGATIVE   Bilirubin Urine MODERATE (A) NEGATIVE   Ketones, ur 15 (A) NEGATIVE mg/dL  Protein, ur >300 (A) NEGATIVE mg/dL   Nitrite POSITIVE (A) NEGATIVE   Leukocytes, UA MODERATE (A) NEGATIVE  Urine microscopic-add on     Status: Abnormal   Collection Time: 12/11/14  4:21 AM  Result Value Ref Range   Squamous Epithelial / LPF 0-5 (A) NONE SEEN   WBC, UA TOO NUMEROUS TO COUNT 0 - 5 WBC/hpf   RBC / HPF TOO NUMEROUS TO COUNT 0 - 5 RBC/hpf   Bacteria, UA MANY (A) NONE SEEN   Urine-Other LESS THAN 10 mL OF URINE SUBMITTED     Comment: MICROSCOPIC EXAM PERFORMED ON UNCONCENTRATED URINE  Strep pneumoniae urinary antigen  (not at [facility])     Status: None   Collection Time: 12/11/14  4:22 AM  Result Value Ref Range   Strep Pneumo Urinary Antigen NEGATIVE NEGATIVE    Comment:        Infection due to S. pneumoniae cannot be absolutely  ruled out since the antigen present may be below the detection limit of the test.   CBG monitoring, ED     Status: Abnormal   Collection Time: 12/11/14  5:14 AM  Result Value Ref Range   Glucose-Capillary 116 (H) 65 - 99 mg/dL  CBC     Status: Abnormal   Collection Time: 12/11/14  5:15 AM  Result Value Ref Range   WBC 18.4 (H) 4.0 - 10.5 K/uL   RBC 3.61 (L) 4.22 - 5.81 MIL/uL   Hemoglobin 9.6 (L) 13.0 - 17.0 g/dL   HCT 31.2 (L) 39.0 - 52.0 %   MCV 86.4 78.0 - 100.0 fL   MCH 26.6 26.0 - 34.0 pg   MCHC 30.8 30.0 - 36.0 g/dL   RDW 18.5 (H) 11.5 - 15.5 %   Platelets 132 (L) 150 - 400 K/uL  Basic metabolic panel     Status: Abnormal   Collection Time: 12/11/14  5:15 AM  Result Value Ref Range   Sodium 139 135 - 145 mmol/L   Potassium 4.1 3.5 - 5.1 mmol/L   Chloride 102 101 - 111 mmol/L   CO2 26 22 - 32 mmol/L   Glucose, Bld 126 (H) 65 - 99 mg/dL   BUN 31 (H) 6 - 20 mg/dL   Creatinine, Ser 3.88 (H) 0.61 - 1.24 mg/dL   Calcium 12.3 (H) 8.9 - 10.3 mg/dL   GFR calc non Af Amer 15 (L) >60 mL/min   GFR calc Af Amer 17 (L) >60 mL/min    Comment: (NOTE) The eGFR has been calculated using the CKD EPI equation. This calculation has not been validated in all clinical situations. eGFR's persistently <60 mL/min signify possible Chronic Kidney Disease.    Anion gap 11 5 - 15  Troponin I     Status: Abnormal   Collection Time: 12/11/14  5:15 AM  Result Value Ref Range   Troponin I 0.84 (HH) <0.031 ng/mL    Comment:        POSSIBLE MYOCARDIAL ISCHEMIA. SERIAL TESTING RECOMMENDED. CRITICAL RESULT CALLED TO, READ BACK BY AND VERIFIED WITH: SHANAS,B RN 12/11/2014 0603 JORDANS   CBG monitoring, ED     Status: Abnormal   Collection Time: 12/11/14  5:57 AM  Result Value Ref Range   Glucose-Capillary 121 (H) 65 - 99 mg/dL  CBG monitoring, ED     Status: Abnormal   Collection Time: 12/11/14  9:05 AM  Result Value Ref Range   Glucose-Capillary 115 (H) 65 - 99 mg/dL  .  QMG:QQPY as in  hpi / pt nonverbal   Physical Exam: Filed Vitals:   12/11/14 1045 12/11/14 1115  BP: 120/56 129/59  Pulse: 85 86  Temp:    Resp: 13 15     General: elderly male chronically ill lethargic/somnolent ,not speaking but opens eyes/ nad HEENT: [place] / Dry MM Neck: no jvd Heart: RRR, no mur, rub, gal Lungs: scattered Rhonchi/nonlabored breathing Abdomen: BS pos, soft , NT, ND Extremities: ischemic appearing L4th toe dry erythematous/ dorsal edema bilaterally /   Skin: as above feet with some ischemic changes Neuro: nonverbal/  Left Hemiplegia/aphasia Dialysis Access: Pos. Bruit LUA AVF  Dialysis Orders: Center: [facility] on TTS . EDW 59.5 HD Bath 2.0k, 3.5 CA  Time 4hr Heparin 1900. Access LUA AVF     HEC. 6 mcg IV/HD   Mircera 200 q 2wks  (given last 12/05/14  DUE 12/18/14)  Units IV/HD  Venofer  50 q wk hd Other op labs hgb 9.3/ ca 7.9  Phos 4.3  171 pth   Assessment/Plan 1. HCAP- Admit team rx 2. Hypercalemia- use low ca bath /hold Hectorol on hd / On sensipar/ fu am lab 3. AMS- multiforcakl = ho dementia/acute illness/ho cva   PER SON =desires  DNR 4. ESRD -  HD TTS  [facility]  5. Hypertension/volume  - keep even on hd  6. Anemia  - esa q Thursday hd due next week  FU hgb / weekly fe on hd 7. Metabolic bone disease -  At op HD problems with Hypocalcemia on added Ca bath/ will use low ca bath /hold ivt d 8. DM type 2 -per admit  Ernest Haber, PA-C [place] [phone] 12/11/2014, 11:57 AM   Pt seen, examined and agree w A/P as above.  Kelly Splinter MD [employer] pager [phone]    cell [phone] 12/11/2014, 5:00 PM

## 2014-12-12 DIAGNOSIS — L8990 Pressure ulcer of unspecified site, unspecified stage: Secondary | ICD-10-CM | POA: Insufficient documentation

## 2014-12-12 LAB — CBC
HEMATOCRIT: 26.7 % — AB (ref 39.0–52.0)
HEMOGLOBIN: 8.1 g/dL — AB (ref 13.0–17.0)
MCH: 25.9 pg — AB (ref 26.0–34.0)
MCHC: 30.3 g/dL (ref 30.0–36.0)
MCV: 85.3 fL (ref 78.0–100.0)
Platelets: 85 10*3/uL — ABNORMAL LOW (ref 150–400)
RBC: 3.13 MIL/uL — ABNORMAL LOW (ref 4.22–5.81)
RDW: 18.3 % — AB (ref 11.5–15.5)
WBC: 11 10*3/uL — ABNORMAL HIGH (ref 4.0–10.5)

## 2014-12-12 LAB — BASIC METABOLIC PANEL
ANION GAP: 11 (ref 5–15)
BUN: 18 mg/dL (ref 6–20)
CHLORIDE: 99 mmol/L — AB (ref 101–111)
CO2: 28 mmol/L (ref 22–32)
Calcium: 11.4 mg/dL — ABNORMAL HIGH (ref 8.9–10.3)
Creatinine, Ser: 2.49 mg/dL — ABNORMAL HIGH (ref 0.61–1.24)
GFR calc Af Amer: 29 mL/min — ABNORMAL LOW (ref >60)
GFR, EST NON AFRICAN AMERICAN: 25 mL/min — AB (ref >60)
GLUCOSE: 78 mg/dL (ref 65–99)
POTASSIUM: 3.3 mmol/L — AB (ref 3.5–5.1)
Sodium: 138 mmol/L (ref 135–145)

## 2014-12-12 LAB — GLUCOSE, CAPILLARY
GLUCOSE-CAPILLARY: 69 mg/dL (ref 65–99)
GLUCOSE-CAPILLARY: 77 mg/dL (ref 65–99)
GLUCOSE-CAPILLARY: 108 mg/dL — AB (ref 65–99)
GLUCOSE-CAPILLARY: 93 mg/dL (ref 65–99)
Glucose-Capillary: 85 mg/dL (ref 65–99)
Glucose-Capillary: 84 mg/dL (ref 65–99)
Glucose-Capillary: 81 mg/dL (ref 65–99)

## 2014-12-12 LAB — LEGIONELLA PNEUMOPHILA SEROGP 1 UR AG: L. PNEUMOPHILA SEROGP 1 UR AG: POSITIVE — AB

## 2014-12-12 MED ORDER — DEXTROSE 50 % IV SOLN
INTRAVENOUS | Status: AC
  Administered 2014-12-12: 25 mL
  Filled 2014-12-12: qty 50

## 2014-12-12 MED ORDER — NEPRO/CARBSTEADY PO LIQD
237.0000 mL | Freq: Two times a day (BID) | ORAL | Status: DC
Start: 2014-12-12 — End: 2014-12-15
  Administered 2014-12-13 – 2014-12-14 (×2): 237 mL via ORAL

## 2014-12-12 MED ORDER — DEXTROSE 5 % IV SOLN
500.0000 mg | INTRAVENOUS | Status: DC
  Administered 2014-12-12 – 2014-12-13 (×2): 500 mg via INTRAVENOUS
  Filled 2014-12-12 (×3): qty 500

## 2014-12-12 MED ORDER — BOOST / RESOURCE BREEZE PO LIQD: 1.0000 | ORAL | Status: DC

## 2014-12-12 NOTE — Progress Notes (Signed)
[facility]  Patient Status: Active (receiving services up to time of hospitalization)  [facility] is providing the following services: RN  If patient discharges after hours, please call [phone].   Greg [name] 12/12/2014, 10:30 AM

## 2014-12-12 NOTE — Progress Notes (Addendum)
Hypoglycemic Event  CBG: 69  Treatment: D50 IV 25 mL  Symptoms: None  Follow-up CBG: Time:0600 CBG Result:84  Possible Reasons for Event: Inadequate meal intake  Comments/MD notified:    Reap, Randon Goldsmith

## 2014-12-12 NOTE — Progress Notes (Signed)
[facility] Progress Note   Subjective: nonverbal  Filed Vitals:   12/11/14 2100 12/12/14 0000 12/12/14 0400 12/12/14 0700  BP: 151/62 128/57 106/53 100/44  Pulse: 86 81 77 62  Temp: 98 F (36.7 C) 97.9 F (36.6 C) 98.6 F (37 C) 97.8 F (36.6 C)  TempSrc: Axillary Axillary Axillary Oral  Resp: 7 27 0 15  Height:  (1.727 m)     Weight: 62 kg (136 lb 11 oz)     SpO2: 100% 100% 100% 99%    Inpatient medications: . aspirin EC  325 mg Oral Daily  . atorvastatin  40 mg Oral Daily  . carvedilol  10 mg Oral Daily  . clopidogrel  75 mg Oral QODAY  . [START ON 12/13/2014] feeding supplement  1 Container Oral Q24H  . feeding supplement (NEPRO CARB STEADY)  237 mL Oral BID BM  . [START ON 12/18/2014] ferric gluconate (FERRLECIT/NULECIT) IV  62.5 mg Intravenous Q Thu-HD  . gabapentin  100 mg Oral Daily  . heparin  5,000 Units Subcutaneous 3 times per day  . insulin aspart  0-9 Units Subcutaneous 6 times per day  . multivitamin  1 tablet Oral QHS  . pantoprazole  40 mg Oral BID  . piperacillin-tazobactam (ZOSYN)  IV  2.25 g Intravenous 3 times per day  . vancomycin  500 mg Intravenous Q T,Th,Sa-HD     acetaminophen, acetaminophen  Exam: Chron ill, contracted, eyes closed No jvd RRR no MRG Chest occ rhonchi scattered Abd +bs soft no ascites Ext L 4th toe dry ischemia Skin ischemic changes LUA AVF +bruit Neuro does not respond verbally, severe gen weakness, bedbound   [facility] TTS  4h  59.5kg   2/3.5 bath  Hep 1900  LUA AVF Hect 6 ug Mircera 200 q2wks last 11/25, due 12/8 Venofer 50/wk Other op labs hgb 9.3/ ca 7.9 Phos 4.3 171 pth   Assessment/Plan 1. HCAP- Admit team rx 2. Hypercalemia- use low ca bath /hold Hectorol on hd / On sensipar/ fu am lab 3. AMS- acute on chronic 4. ESRD - HD TTS [facility]  5. Hypertension/volume - keep even on hd  6. Anemia - esa q Thursday hd due next week FU hgb / weekly fe on hd 7. Metabolic bone disease - At op HD  problems with Hypocalcemia on added Ca bath/ will use low ca bath /hold ivt d 8. DM type 2 -per admit 9. DNR 10. EOL - spoke with family and told them that further dialysis most likely is just going to prolong his suffering , that he is not going to fundamentally get any better and recommend that we now move towards withdrawal of dialysis and comfort care.  The son was quite responsive and supportive and wants to discuss with extended family before making a final decision about withdrawal of dialysis.   Plan - as above   Vinson Moselle MD [facility] pager [phone]    cell [phone] 12/12/2014, 3:35 PM    Recent Labs Lab 12/10/14 2232 12/11/14 0515 12/12/14 0417  NA 137 139 138  K 3.8 4.1 3.3*  CL 101 102 99*  CO2 GLUCOSE 159* 126* 78  BUN 29* 31* 18  CREATININE 3.71* 3.88* 2.49*  CALCIUM 12.2* 12.3* 11.4*    Recent Labs Lab 12/10/14 2232  AST 24  ALT 10*  ALKPHOS 119  BILITOT 0.7  PROT 6.6  ALBUMIN 1.4*    Recent Labs Lab 12/10/14 1845 12/11/14 0515  12/12/14 0417  WBC 18.8* 18.4* 11.0*  NEUTROABS 16.0*  --   --   HGB 9.6* 9.6* 8.1*  HCT 31.1* 31.2* 26.7*  MCV 85.9 86.4 85.3  PLT 125* 132* 85*

## 2014-12-12 NOTE — Progress Notes (Signed)
Utilization review completed. Ziyon Soltau, RN, BSN. 

## 2014-12-12 NOTE — Progress Notes (Signed)
[facility] PROGRESS NOTE  Reynard Christoffersen VWU:[id number] DOB: Jul 27, 1945 DOA: 12/10/2014 PCP: Rinaldo Cloud, MD  Assessment/Plan: 1. Sepsis -due to UTI/possible HCAP -continue Empiric Vanc and Zosyn -Urine cx with EColi and Blood Cx with 1/2 GPC -prior ESBL Ecoli UTI sensitive to Zosyn -mentation without significant changes  2. Toxic metabolic encephalopathy -due to Sepsis, also has some cognitive dysfunction at baseline due to CVA/?Mild Dementia -without much changes  2. ESRD on HD TTS -renal following  3. H/o CVA with L hemiplegia/aphasia -continue ASA/plavix/statin  4. DM -SSI  5. Elevated Troponin -due to demand, ESRD, no chest pain  6. Hypercalcemia/secondary hyperparathyroidism -on sensipar, per Renal  DVT proph: Hep SQ  Code Status: DNR,  d/w son Jerimey Burridge yetserday Family Communication: none at bedside, called and d/w son yetserday Disposition Plan: inpatient   Consultants:  Renal  HPI/Subjective: Remains confused, no events overnight  Objective: Filed Vitals:   12/12/14 0400 12/12/14 0700  BP: 106/53 100/44  Pulse: 77 62  Temp: 98.6 F (37 C) 97.8 F (36.6 C)  Resp: 0 15    Intake/Output Summary (Last 24 hours) at 12/12/14 1254 Last data filed at 12/11/14 2255  Gross per 24 hour  Intake    100 ml  Output  -1069 ml  Net   1169 ml   Filed Weights   12/10/14 1825 12/11/14 2100  Weight: 58.7 kg (129 lb 6.6 oz) 62 kg (136 lb 11 oz)    Exam:   General:  Somnolent, arousible, confused  Cardiovascular: S1S2/RRR  Respiratory: ronchi at bases  Abdomen: soft, NT, BS present  Musculoskeletal: LUE and LLE contracted  Neuro: obtunded, L hemiplegia   Data Reviewed: Basic Metabolic Panel:  Recent Labs Lab 12/10/14 2232 12/11/14 0515 12/12/14 0417  NA 137 139 138  K 3.8 4.1 3.3*  CL 101 102 99*  CO2 GLUCOSE 159* 126* 78  BUN 29* 31* 18  CREATININE 3.71* 3.88* 2.49*  CALCIUM 12.2* 12.3* 11.4*   Liver Function  Tests:  Recent Labs Lab 12/10/14 2232  AST 24  ALT 10*  ALKPHOS 119  BILITOT 0.7  PROT 6.6  ALBUMIN 1.4*   No results for input(s): LIPASE, AMYLASE in the last 168 hours. No results for input(s): AMMONIA in the last 168 hours. CBC:  Recent Labs Lab 12/10/14 1845 12/11/14 0515 12/12/14 0417  WBC 18.8* 18.4* 11.0*  NEUTROABS 16.0*  --   --   HGB 9.6* 9.6* 8.1*  HCT 31.1* 31.2* 26.7*  MCV 85.9 86.4 85.3  PLT 125* 132* 85*   Cardiac Enzymes:  Recent Labs Lab 12/11/14 0109 12/11/14 0515 12/11/14 1155  TROPONINI 0.30* 0.84* 0.24*   BNP (last 3 results) No results for input(s): BNP in the last 8760 hours.  ProBNP (last 3 results) No results for input(s): PROBNP in the last 8760 hours.  CBG:  Recent Labs Lab 12/11/14 2105 12/12/14 0043 12/12/14 0401 12/12/14 0605 12/12/14 0757  GLUCAP 98 85 69 84 81    Recent Results (from the past 240 hour(s))  Blood Culture (routine x 2)     Status: None (Preliminary result)   Collection Time: 12/10/14  6:34 PM  Result Value Ref Range Status   Specimen Description BLOOD RIGHT WRIST  Final   Special Requests BOTTLES DRAWN AEROBIC AND ANAEROBIC  Final   Culture  Setup Time   Final    GRAM POSITIVE COCCI IN CLUSTERS IN BOTH AEROBIC AND ANAEROBIC BOTTLES CRITICAL RESULT CALLED TO, READ BACK  BY AND VERIFIED WITH: B REAP RN 1933 12/11/14 A BROWNING    Culture GRAM POSITIVE COCCI  Final   Report Status PENDING  Incomplete  Blood Culture (routine x 2)     Status: None (Preliminary result)   Collection Time: 12/10/14  6:48 PM  Result Value Ref Range Status   Specimen Description BLOOD RIGHT ARM  Final   Special Requests BOTTLES DRAWN AEROBIC AND ANAEROBIC 5CC  Final   Culture NO GROWTH < 24 HOURS  Final   Report Status PENDING  Incomplete  Urine culture     Status: None (Preliminary result)   Collection Time: 12/11/14  4:21 AM  Result Value Ref Range Status   Specimen Description URINE, CATHETERIZED  Final   Special  Requests NONE  Final   Culture >=100,000 COLONIES/mL ESCHERICHIA COLI  Final   Report Status PENDING  Incomplete  MRSA PCR Screening     Status: None   Collection Time: 12/11/14  9:09 PM  Result Value Ref Range Status   MRSA by PCR NEGATIVE NEGATIVE Final    Comment:        The GeneXpert MRSA Assay (FDA approved for NASAL specimens only), is one component of a comprehensive MRSA colonization surveillance program. It is not intended to diagnose MRSA infection nor to guide or monitor treatment for MRSA infections.      Studies: Dg Chest Portable 1 View  12/10/2014  CLINICAL DATA:  Cough. End-stage renal disease on dialysis. Cirrhosis. EXAM: PORTABLE CHEST 1 VIEW COMPARISON:  10/15/2014 FINDINGS: Patient is partially rotated to the right. Ill-defined airspace opacity is seen in the peripheral right lower lung. Left lung remains clear. Heart size is stable allowing for differences in patient positioning. No pneumothorax or pleural effusion visualized. IMPRESSION: Suboptimal due to positioning. Peripheral right lower lung airspace opacity, suspicious for infiltrate/pneumonia. Electronically Signed   By: Myles Rosenthal M.D.   On: 12/10/2014 19:04    Scheduled Meds: . aspirin EC  325 mg Oral Daily  . atorvastatin  40 mg Oral Daily  . carvedilol  10 mg Oral Daily  . clopidogrel  75 mg Oral QODAY  . feeding supplement  1 Container Oral TID BM  . [START ON 12/18/2014] ferric gluconate (FERRLECIT/NULECIT) IV  62.5 mg Intravenous Q Thu-HD  . gabapentin  100 mg Oral Daily  . heparin  5,000 Units Subcutaneous 3 times per day  . insulin aspart  0-9 Units Subcutaneous 6 times per day  . multivitamin  1 tablet Oral QHS  . pantoprazole  40 mg Oral BID  . piperacillin-tazobactam (ZOSYN)  IV  2.25 g Intravenous 3 times per day  . vancomycin  500 mg Intravenous Q T,Th,Sa-HD   Continuous Infusions:   Antibiotics Given (last 72 hours)    None      Principal Problem:   HCAP  (healthcare-associated pneumonia) Active Problems:   DM type 2 causing ESRD (HCC)   Sepsis (HCC)   ESRD on hemodialysis (HCC)   Hypercalcemia   Pressure ulcer    Time spent:    [facility]  [facility] Pager [phone]. If 7PM-7AM, please contact night-coverage at [email or web address], password [facility] 12/12/2014, 12:54 PM  LOS: 2 days

## 2014-12-12 NOTE — Progress Notes (Signed)
Initial Nutrition Assessment  DOCUMENTATION CODES:   Severe malnutrition in context of chronic illness  INTERVENTION:  Provide Boost Breeze po once daily, each supplement provides 250 kcal and 9 grams of protein.  Provide Nepro Shake po BID, each supplement provides 425 kcal and 19 grams protein.  Encourage adequate PO intake.   NUTRITION DIAGNOSIS:   Malnutrition related to chronic illness as evidenced by percent weight loss, severe depletion of muscle mass.  GOAL:   Patient will meet greater than or equal to 90% of their needs  MONITOR:   PO intake, Supplement acceptance, Weight trends, Labs, I & O's, Skin  REASON FOR ASSESSMENT:   Malnutrition Screening Tool    ASSESSMENT:   69 y.o. male admitted with PNA/ UTI. Per son had AMS past day("not talking much") and fever to 103 at home with several days of dry cough.and foul smelling urine . He has ho cva with Aphasia/ Left side Weakness. In ER temp 101.9 with CXR Showing PNA. PMH of ESRD on HD, cirrhosis, DM. ETOH abuse.   Pt lethargic and moaning during time of visit. No family at bedside. RD unable to obtain nutrition history. Per Epic weight records, pt with a 20% weight loss in 5 months. Pt currently has Boost Breeze ordered. RD to additionally order Nepro Shake to aid in caloric and protein needs.   Nutrition-Focused physical exam completed. Findings are no fat depletion, severe muscle depletion, and moderate edema.   Labs and medications reviewed.   Diet Order:  Diet renal with fluid restriction Fluid restriction:: 1200 mL Fluid; Room service appropriate?: Yes; Fluid consistency:: Thin  Skin:  Wound (see comment) (Stage II pressure ulcer on sacrum)  Last BM:  unknown  Height:   Ht Readings from Last 1 Encounters:  12/11/14 5\' 8"  (1.727 m)    Weight:   Wt Readings from Last 1 Encounters:  12/11/14 136 lb 11 oz (62 kg)    Ideal Body Weight:  70 kg  BMI:  Body mass index is 20.79 kg/(m^2).  Estimated  Nutritional Needs:   Kcal:  1900-2100  Protein:  90-100 grams  Fluid:  1.2 L/day  EDUCATION NEEDS:   No education needs identified at this time  Roslyn Smiling, MS, RD, LDN Pager # [phone] After hours/ weekend pager # [phone]

## 2014-12-13 LAB — BASIC METABOLIC PANEL
ANION GAP: 11 (ref 5–15)
BUN: 24 mg/dL — ABNORMAL HIGH (ref 6–20)
CALCIUM: 10.9 mg/dL — AB (ref 8.9–10.3)
CHLORIDE: 100 mmol/L — AB (ref 101–111)
CO2: 26 mmol/L (ref 22–32)
Creatinine, Ser: 3.07 mg/dL — ABNORMAL HIGH (ref 0.61–1.24)
GFR calc Af Amer: 22 mL/min — ABNORMAL LOW (ref >60)
GFR calc non Af Amer: 19 mL/min — ABNORMAL LOW (ref >60)
Glucose, Bld: 120 mg/dL — ABNORMAL HIGH (ref 65–99)
Potassium: 3.3 mmol/L — ABNORMAL LOW (ref 3.5–5.1)
Sodium: 137 mmol/L (ref 135–145)

## 2014-12-13 LAB — CULTURE, BLOOD (ROUTINE X 2)

## 2014-12-13 LAB — GLUCOSE, CAPILLARY
GLUCOSE-CAPILLARY: 96 mg/dL (ref 65–99)
GLUCOSE-CAPILLARY: 115 mg/dL — AB (ref 65–99)
Glucose-Capillary: 116 mg/dL — ABNORMAL HIGH (ref 65–99)
Glucose-Capillary: 142 mg/dL — ABNORMAL HIGH (ref 65–99)
Glucose-Capillary: 95 mg/dL (ref 65–99)
Glucose-Capillary: 128 mg/dL — ABNORMAL HIGH (ref 65–99)

## 2014-12-13 LAB — CBC
HEMATOCRIT: 27.6 % — AB (ref 39.0–52.0)
Hemoglobin: 8.3 g/dL — ABNORMAL LOW (ref 13.0–17.0)
MCH: 25.5 pg — ABNORMAL LOW (ref 26.0–34.0)
MCHC: 30.1 g/dL (ref 30.0–36.0)
MCV: 84.9 fL (ref 78.0–100.0)
Platelets: 75 10*3/uL — ABNORMAL LOW (ref 150–400)
RBC: 3.25 MIL/uL — ABNORMAL LOW (ref 4.22–5.81)
RDW: 18.1 % — AB (ref 11.5–15.5)
WBC: 9.8 10*3/uL (ref 4.0–10.5)

## 2014-12-13 NOTE — Progress Notes (Signed)
ANTIBIOTIC CONSULT NOTE - Follow Up  Pharmacy Consult for Zosyn Indication: rule out sepsis  No Known Allergies  Patient Measurements: Height: 5\' 8"  (172.7 cm) Weight: 136 lb 11 oz (62 kg) IBW/kg (Calculated) : 68.4  Vital Signs: Temp: 97.3 F (36.3 C) (12/03 0700) Temp Source: Axillary (12/03 0700) BP: 136/53 mmHg (12/03 0822) Pulse Rate: 65 (12/03 0822) Intake/Output from previous day: 12/02 0701 - 12/03 0700 In: 450 [IV Piggyback:450] Out: -   Labs:  Recent Labs  12/11/14 0515 12/12/14 0417 12/13/14 0620  WBC 18.4* 11.0* 9.8  HGB 9.6* 8.1* 8.3*  PLT 132* 85* 75*  CREATININE 3.88* 2.49* 3.07*   Estimated Creatinine Clearance: 20.2 mL/min (by C-G formula based on Cr of 3.07).  Microbiology: Recent Results (from the past 720 hour(s))  Blood Culture (routine x 2)     Status: None   Collection Time: 12/10/14  6:34 PM  Result Value Ref Range Status   Specimen Description BLOOD RIGHT WRIST  Final   Special Requests BOTTLES DRAWN AEROBIC AND ANAEROBIC  Final   Culture  Setup Time   Final    GRAM POSITIVE COCCI IN CLUSTERS IN BOTH AEROBIC AND ANAEROBIC BOTTLES CRITICAL RESULT CALLED TO, READ BACK BY AND VERIFIED WITH: B REAP RN 1933 12/11/14 A BROWNING    Culture   Final    STAPHYLOCOCCUS SPECIES (COAGULASE NEGATIVE) THE SIGNIFICANCE OF ISOLATING THIS ORGANISM FROM A SINGLE SET OF BLOOD CULTURES WHEN MULTIPLE SETS ARE DRAWN IS UNCERTAIN. PLEASE NOTIFY THE MICROBIOLOGY DEPARTMENT WITHIN ONE WEEK IF SPECIATION AND SENSITIVITIES ARE REQUIRED.    Report Status 12/13/2014 FINAL  Final  Blood Culture (routine x 2)     Status: None (Preliminary result)   Collection Time: 12/10/14  6:48 PM  Result Value Ref Range Status   Specimen Description BLOOD RIGHT ARM  Final   Special Requests BOTTLES DRAWN AEROBIC AND ANAEROBIC 5CC  Final   Culture NO GROWTH 2 DAYS  Final   Report Status PENDING  Incomplete  Urine culture     Status: None (Preliminary result)   Collection  Time: 12/11/14  4:21 AM  Result Value Ref Range Status   Specimen Description URINE, CATHETERIZED  Final   Special Requests NONE  Final   Culture >=100,000 COLONIES/mL ESCHERICHIA COLI  Final   Report Status PENDING  Incomplete  MRSA PCR Screening     Status: None   Collection Time: 12/11/14  9:09 PM  Result Value Ref Range Status   MRSA by PCR NEGATIVE NEGATIVE Final    Comment:        The GeneXpert MRSA Assay (FDA approved for NASAL specimens only), is one component of a comprehensive MRSA colonization surveillance program. It is not intended to diagnose MRSA infection nor to guide or monitor treatment for MRSA infections.     Medical History: Past Medical History  Diagnosis Date  . ESRD on hemodialysis (HCC)     Started HD around 2006.   Marland Kitchen Cirrhosis (HCC)   . Alcohol abuse   . Stroke [facility])     left sided deficits  . Hypertension   . Diabetes mellitus     type 2   . Hepatitis     hep c  . Constipation   . Coronary artery disease   . Hypercholesterolemia    Assessment: 69 yo m with ESRD on HD presenting to the ED on 11/30 with AMS.  He was placed on broad spectrum IV antibiotics with Vancomycin and Zosyn for sepsis  coverage.  He responded well and his IV Vancomycin was discontinued on 12/2.  He is continued on IV Zosyn and Azithromycin.   Wbc 18.8>>9.8, temp 101.9 > now afebrile.  His mental status changes has corrected and he is now at baseline.  Zosyn 11/30 >> Azithromycin 12/2>>  11/30 BCx: sent - NGTD 11/30 UCx: sent- E.Coli  Goal of Therapy:  Proper dosing of antibiotics  Plan:  Continue Zosyn 2.25 gm IV q8h  Continue Azithromycin - per MD - would de-escalate if sepsis  Is no longer a concern F/u HD schedule, cbc, cx, clinical course  [name], PharmD., MS Clinical Pharmacist Pager:  [phone] Thank you for allowing pharmacy to be part of this patients care team. 12/13/2014 1:15 PM

## 2014-12-13 NOTE — Progress Notes (Signed)
  [facility] Progress Note   Subjective: responds w voice today   Filed Vitals:   12/12/14 2326 12/13/14 0339 12/13/14 0700 12/13/14 0822  BP: 113/49 111/51 131/57 136/53  Pulse: 69 64 65 65  Temp: 97.3 F (36.3 C) 97.7 F (36.5 C) 97.3 F (36.3 C)   TempSrc: Axillary Axillary Axillary   Resp:   0 24  Height:      Weight:      SpO2: 98% 100% 100% 99%    Inpatient medications: . aspirin EC  325 mg Oral Daily  . atorvastatin  40 mg Oral Daily  . azithromycin  500 mg Intravenous Q24H  . carvedilol  10 mg Oral Daily  . clopidogrel  75 mg Oral QODAY  . feeding supplement  1 Container Oral Q24H  . feeding supplement (NEPRO CARB STEADY)  237 mL Oral BID BM  . [START ON 12/18/2014] ferric gluconate (FERRLECIT/NULECIT) IV  62.5 mg Intravenous Q Thu-HD  . gabapentin  100 mg Oral Daily  . insulin aspart  0-9 Units Subcutaneous 6 times per day  . multivitamin  1 tablet Oral QHS  . pantoprazole  40 mg Oral BID  . piperacillin-tazobactam (ZOSYN)  IV  2.25 g Intravenous 3 times per day     acetaminophen, acetaminophen  Exam: Chron ill, awakens, lethargic No jvd RRR no MRG Chest occ rhonchi scattered Abd +bs soft no ascites Ext L 4th toe dry ischemia Skin ischemic changes LUA AVF +bruit Neuro responds verbally, severe gen weakness, bedbound, contracted LE's   [facility] TTS  4h  59.5kg   2/3.5 bath  Hep 1900  LUA AVF Hect 6 ug Mircera 200 q2wks last 11/25, due 12/8 Venofer 50/wk Other op labs hgb 9.3/ ca 7.9 Phos 4.3 171 pth   Assessment/Plan 1. HCAP- Admit team rx 2. Hypercalemia- use low ca bath /hold Hectorol on hd / On sensipar/ fu am lab 3. AMS- acute on chronic 4. ESRD - HD TTS [facility]  5. Hypertension/volume - keep even on hd  6. Anemia - esa q Thursday hd due next week FU hgb / weekly fe on hd 7. 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 DM type 2 -per admit 9. DNR 10. EOL - patient is extremely debilitated and a poor candidate for continued dialysis.   Recommend transition towards comfort care. Have d/w primary MD.   Plan - HD today, as above   Greg Moselle MD [facility] pager [phone]    cell [phone] 12/13/2014, 10:51 AM    Recent Labs Lab 12/11/14 0515 12/12/14 0417 12/13/14 0620  NA 139 138 137  K 4.1 3.3* 3.3*  CL 102 99* 100*  CO2 26 28 26   GLUCOSE 126* 78 120*  BUN 31* 18 24*  CREATININE 3.88* 2.49* 3.07*  CALCIUM 12.3* 11.4* 10.9*    Recent Labs Lab 12/10/14 2232  AST 24  ALT 10*  ALKPHOS 119  BILITOT 0.7  PROT 6.6  ALBUMIN 1.4*    Recent Labs Lab 12/10/14 1845 12/11/14 0515 12/12/14 0417 12/13/14 0620  WBC 18.8* 18.4* 11.0* 9.8  NEUTROABS 16.0*  --   --   --   HGB 9.6* 9.6* 8.1* 8.3*  HCT 31.1* 31.2* 26.7* 27.6*  MCV 85.9 86.4 85.3 84.9  PLT 125* 132* 85* 75*

## 2014-12-13 NOTE — Progress Notes (Addendum)
[facility] PROGRESS NOTE  Greg [name] ZOX:[id number] DOB: 1945-04-24 DOA: 12/10/2014 PCP: Rinaldo Cloud, MD      Assessment/Plan: GLOBAL: Overall very poor prognosis, due to CVA with hemiplegia/ bedbound /decubitus wounds, encephalopathy/possible dementia , Hep C/cirrhosis, severe cardiomyopathy/NSTEMI/ EF of 15% on last stress test 10/16, ESRD, severe malnutrition, peripheral arterial disease and dry gangrene of toe and current ongoing sepsis. I called and discussed with son Karie Mainland this morning and strongly recommended the need to focus on comfort measures only and stop everything including dialysis  Discussed with renal Dr. Arlean Hopping who is agreeable   1. Sepsis -due to UTI/possible HCAP, having purulent urine  -On Day 2 of continue Empiric Vanc and Zosyn -Urine cx with EColi, prior ESBL Ecoli UTI sensitive to Zosyn, continue Zosyn, pending sensitivities -Blood Cx with 1/2 Coag negative staph c/w contamination, STop Vanc -will not pursue CT abd pelvis to evaluate bladder/prostate, h/o prostatitic abscess, not candidate for surgery anyway-seen by Urology last admit-mentation without significant changes  2. Toxic metabolic encephalopathy -due to Sepsis, also has some cognitive dysfunction at baseline due to CVA/?Mild Dementia -slight improvement only, per RN was speaking more to family members last pm  2. ESRD on HD TTS -renal following  3. H/o CVA with L hemiplegia/aphasia/Bed bound/decub wounds -continue ASA/plavix/statin  4. DM -stable, on SSI  5. Elevated Troponin -due to demand, ESRD, no chest pain  6. Hypercalcemia despite secondary hyperparathyroidism -on sensipar, per Renal-adjusted HD bath  7. CAD/Severe Cardiomyopathy -NSTEMI in October, EF is 15% -not a candidate for ACID per Cards in past admission  8. PAD with Gangrene of fifth toe -not a surgical candidate  9. Hep C and Cirrhosis  10. Severe malnutrition  DVT proph: Hep SQ stopped due to drop in  plts  Code Status: DNR,  d/w son Jaxton Casale 12/1 and again today,  Family Communication: none at bedside, called and d/w son yetserday, and this am regarding poor prognosis Disposition Plan: inpatient   Consultants:  Renal  HPI/Subjective: Remains confused, no events overnight  Objective: Filed Vitals:   12/13/14 0700 12/13/14 0822  BP: 131/57 136/53  Pulse: 65 65  Temp: 97.3 F (36.3 C)   Resp: 0 24    Intake/Output Summary (Last 24 hours) at 12/13/14 1031 Last data filed at 12/13/14 0515  Gross per 24 hour  Intake    450 ml  Output      0 ml  Net    450 ml   Filed Weights   12/10/14 1825 12/11/14 2100  Weight: 58.7 kg (129 lb 6.6 oz) 62 kg (136 lb 11 oz)    Exam:   General:  Somnolent, arousible, confused  Cardiovascular: S1S2/RRR  Respiratory: ronchi at bases  Abdomen: soft, NT, BS present  Musculoskeletal: LUE and LLE contracted, dry gangrene of 5th toe L foot  Neuro: obtunded, L hemiplegia   Data Reviewed: Basic Metabolic Panel:  Recent Labs Lab 12/10/14 2232 12/11/14 0515 12/12/14 0417 12/13/14 0620  NA 137 139 138 137  K 3.8 4.1 3.3* 3.3*  CL 101 102 99* 100*  CO2 GLUCOSE 159* 126* 78 120*  BUN 29* 31* 18 24*  CREATININE 3.71* 3.88* 2.49* 3.07*  CALCIUM 12.2* 12.3* 11.4* 10.9*   Liver Function Tests:  Recent Labs Lab 12/10/14 2232  AST 24  ALT 10*  ALKPHOS 119  BILITOT 0.7  PROT 6.6  ALBUMIN 1.4*   No results for input(s): LIPASE, AMYLASE in the last 168 hours. No  results for input(s): AMMONIA in the last 168 hours. CBC:  Recent Labs Lab 12/10/14 1845 12/11/14 0515 12/12/14 0417 12/13/14 0620  WBC 18.8* 18.4* 11.0* 9.8  NEUTROABS 16.0*  --   --   --   HGB 9.6* 9.6* 8.1* 8.3*  HCT 31.1* 31.2* 26.7* 27.6*  MCV 85.9 86.4 85.3 84.9  PLT 125* 132* 85* 75*   Cardiac Enzymes:  Recent Labs Lab 12/11/14 0109 12/11/14 0515 12/11/14 1155  TROPONINI 0.30* 0.84* 0.24*   BNP (last 3 results) No results for  input(s): BNP in the last 8760 hours.  ProBNP (last 3 results) No results for input(s): PROBNP in the last 8760 hours.  CBG:  Recent Labs Lab 12/12/14 1709 12/12/14 2136 12/12/14 2325 12/13/14 0338 12/13/14 0818  GLUCAP 108* 93 96 115* 116*    Recent Results (from the past 240 hour(s))  Blood Culture (routine x 2)     Status: None   Collection Time: 12/10/14  6:34 PM  Result Value Ref Range Status   Specimen Description BLOOD RIGHT WRIST  Final   Special Requests BOTTLES DRAWN AEROBIC AND ANAEROBIC  Final   Culture  Setup Time   Final    GRAM POSITIVE COCCI IN CLUSTERS IN BOTH AEROBIC AND ANAEROBIC BOTTLES CRITICAL RESULT CALLED TO, READ BACK BY AND VERIFIED WITH: B REAP RN 1933 12/11/14 A BROWNING    Culture   Final    STAPHYLOCOCCUS SPECIES (COAGULASE NEGATIVE) THE SIGNIFICANCE OF ISOLATING THIS ORGANISM FROM A SINGLE SET OF BLOOD CULTURES WHEN MULTIPLE SETS ARE DRAWN IS UNCERTAIN. PLEASE NOTIFY THE MICROBIOLOGY DEPARTMENT WITHIN ONE WEEK IF SPECIATION AND SENSITIVITIES ARE REQUIRED.    Report Status 12/13/2014 FINAL  Final  Blood Culture (routine x 2)     Status: None (Preliminary result)   Collection Time: 12/10/14  6:48 PM  Result Value Ref Range Status   Specimen Description BLOOD RIGHT ARM  Final   Special Requests BOTTLES DRAWN AEROBIC AND ANAEROBIC 5CC  Final   Culture NO GROWTH 2 DAYS  Final   Report Status PENDING  Incomplete  Urine culture     Status: None (Preliminary result)   Collection Time: 12/11/14  4:21 AM  Result Value Ref Range Status   Specimen Description URINE, CATHETERIZED  Final   Special Requests NONE  Final   Culture >=100,000 COLONIES/mL ESCHERICHIA COLI  Final   Report Status PENDING  Incomplete  MRSA PCR Screening     Status: None   Collection Time: 12/11/14  9:09 PM  Result Value Ref Range Status   MRSA by PCR NEGATIVE NEGATIVE Final    Comment:        The GeneXpert MRSA Assay (FDA approved for NASAL specimens only), is one  component of a comprehensive MRSA colonization surveillance program. It is not intended to diagnose MRSA infection nor to guide or monitor treatment for MRSA infections.      Studies: No results found.  Scheduled Meds: . aspirin EC  325 mg Oral Daily  . atorvastatin  40 mg Oral Daily  . azithromycin  500 mg Intravenous Q24H  . carvedilol  10 mg Oral Daily  . clopidogrel  75 mg Oral QODAY  . feeding supplement  1 Container Oral Q24H  . feeding supplement (NEPRO CARB STEADY)  237 mL Oral BID BM  . [START ON 12/18/2014] ferric gluconate (FERRLECIT/NULECIT) IV  62.5 mg Intravenous Q Thu-HD  . gabapentin  100 mg Oral Daily  . insulin aspart  0-9 Units Subcutaneous 6  times per day  . multivitamin  1 tablet Oral QHS  . pantoprazole  40 mg Oral BID  . piperacillin-tazobactam (ZOSYN)  IV  2.25 g Intravenous 3 times per day  . vancomycin  500 mg Intravenous Q T,Th,Sa-HD   Continuous Infusions:   Antibiotics Given (last 72 hours)    Date/Time Action Medication Dose Rate   12/12/14 2301 Given   azithromycin (ZITHROMAX) 500 mg in dextrose 5 % 250 mL IVPB 500 mg 250 mL/hr      Principal Problem:   HCAP (healthcare-associated pneumonia) Active Problems:   DM type 2 causing ESRD (HCC)   Sepsis (HCC)   ESRD on hemodialysis (HCC)   Hypercalcemia   Pressure ulcer    Time spent:    [facility]  [facility] Pager [phone]. If 7PM-7AM, please contact night-coverage at [email or web address], password [facility] 12/13/2014, 10:31 AM  LOS: 3 days

## 2014-12-14 DIAGNOSIS — L8990 Pressure ulcer of unspecified site, unspecified stage: Secondary | ICD-10-CM

## 2014-12-14 LAB — GLUCOSE, CAPILLARY
GLUCOSE-CAPILLARY: 120 mg/dL — AB (ref 65–99)
GLUCOSE-CAPILLARY: 86 mg/dL (ref 65–99)
Glucose-Capillary: 69 mg/dL (ref 65–99)
Glucose-Capillary: 84 mg/dL (ref 65–99)
Glucose-Capillary: 112 mg/dL — ABNORMAL HIGH (ref 65–99)
Glucose-Capillary: 114 mg/dL — ABNORMAL HIGH (ref 65–99)

## 2014-12-14 LAB — URINE CULTURE: Culture: >=100000

## 2014-12-14 MED ORDER — SODIUM CHLORIDE 0.9 % IV SOLN: 100.0000 mL | INTRAVENOUS | Status: DC | PRN

## 2014-12-14 MED ORDER — HEPARIN SODIUM (PORCINE) 1000 UNIT/ML DIALYSIS: 1000.0000 [IU] | INTRAMUSCULAR | Status: DC | PRN

## 2014-12-14 MED ORDER — DEXTROSE 50 % IV SOLN
25.0000 mL | Freq: Once | INTRAVENOUS | Status: AC
  Administered 2014-12-14: 25 mL via INTRAVENOUS

## 2014-12-14 MED ORDER — LIDOCAINE HCL (PF) 1 % IJ SOLN: 5.0000 mL | INTRAMUSCULAR | Status: DC | PRN

## 2014-12-14 MED ORDER — DEXTROSE 50 % IV SOLN
INTRAVENOUS | Status: AC
  Filled 2014-12-14: qty 50

## 2014-12-14 MED ORDER — CINACALCET HCL 30 MG PO TABS: 180.0000 mg | ORAL_TABLET | Freq: Every day | ORAL | Status: DC

## 2014-12-14 MED ORDER — LIDOCAINE-PRILOCAINE 2.5-2.5 % EX CREA: 1.0000 "application " | TOPICAL_CREAM | CUTANEOUS | Status: DC | PRN

## 2014-12-14 MED ORDER — ALTEPLASE 2 MG IJ SOLR
2.0000 mg | Freq: Once | INTRAMUSCULAR | Status: DC | PRN
  Filled 2014-12-14: qty 2

## 2014-12-14 MED ORDER — HEPARIN SODIUM (PORCINE) 1000 UNIT/ML DIALYSIS: 2000.0000 [IU] | Freq: Once | INTRAMUSCULAR | Status: DC

## 2014-12-14 MED ORDER — PENTAFLUOROPROP-TETRAFLUOROETH EX AERO: 1.0000 "application " | INHALATION_SPRAY | CUTANEOUS | Status: DC | PRN

## 2014-12-14 NOTE — Progress Notes (Signed)
  [facility] Progress Note   Subjective: no c/o  Filed Vitals:   12/14/14 0400 12/14/14 0500 12/14/14 0700 12/14/14 0800  BP: 122/56 117/63 133/65 111/60  Pulse: 80 76 82 80  Temp: 98.3 F (36.8 C)   98.7 F (37.1 C)  TempSrc: Axillary   Axillary  Resp: 24 19 17 29   Height:      Weight:  63.2 kg (139 lb 5.3 oz)    SpO2: 100% 100% 100% 97%    Inpatient medications: . aspirin EC  325 mg Oral Daily  . atorvastatin  40 mg Oral Daily  . carvedilol  10 mg Oral Daily  . clopidogrel  75 mg Oral QODAY  . dextrose      . feeding supplement  1 Container Oral Q24H  . feeding supplement (NEPRO CARB STEADY)  237 mL Oral BID BM  . [START ON 12/18/2014] ferric gluconate (FERRLECIT/NULECIT) IV  62.5 mg Intravenous Q Thu-HD  . gabapentin  100 mg Oral Daily  . heparin  2,000 Units Dialysis Once in dialysis  . insulin aspart  0-9 Units Subcutaneous 6 times per day  . multivitamin  1 tablet Oral QHS  . pantoprazole  40 mg Oral BID  . piperacillin-tazobactam (ZOSYN)  IV  2.25 g Intravenous 3 times per day     sodium chloride, sodium chloride, acetaminophen, acetaminophen, alteplase, heparin, lidocaine (PF), lidocaine-prilocaine, pentafluoroprop-tetrafluoroeth  Exam: Chron ill, awakens, lethargic No jvd RRR no MRG Chest occ rhonchi scattered Abd +bs soft no ascites Ext L 4th toe dry ischemia Skin ischemic changes LUA AVF +bruit Neuro responds verbally, severe gen weakness, bedbound, contracted LE's   [facility] TTS  4h  59.5kg   2/3.5 bath  Hep 1900  LUA AVF Hect 6 ug Mircera 200 q2wks last 11/25, due 12/8 Venofer 50/wk Other op labs hgb 9.3/ ca 7.9 Phos 4.3 171 pth   Assessment/Plan 1. HCAP- Admit team rx 2. AMS- acute on chronic 3. ESRD - HD TTS [facility]  4. HTN/volume -  5. 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 DM type 2 -per admit 8. DNR 9. EOL - patient is extremely debilitated and a poor candidate for  continued dialysis.  Recommend transition towards comfort care. Have d/w primary MD. Have d/w son today Karie Mainland). Recommend pall care involvement.    Plan - as above    Vinson Moselle MD [facility] pager [phone]    cell [phone] 12/14/2014, 11:36 AM    Recent Labs Lab 12/11/14 0515 12/12/14 0417 12/13/14 0620  NA 139 138 137  K 4.1 3.3* 3.3*  CL 102 99* 100*  CO2 26 28 26   GLUCOSE 126* 78 120*  BUN 31* 18 24*  CREATININE 3.88* 2.49* 3.07*  CALCIUM 12.3* 11.4* 10.9*    Recent Labs Lab 12/10/14 2232  AST 24  ALT 10*  ALKPHOS 119  BILITOT 0.7  PROT 6.6  ALBUMIN 1.4*    Recent Labs Lab 12/10/14 1845 12/11/14 0515 12/12/14 0417 12/13/14 0620  WBC 18.8* 18.4* 11.0* 9.8  NEUTROABS 16.0*  --   --   --   HGB 9.6* 9.6* 8.1* 8.3*  HCT 31.1* 31.2* 26.7* 27.6*  MCV 85.9 86.4 85.3 84.9  PLT 125* 132* 85* 75*

## 2014-12-14 NOTE — Progress Notes (Addendum)
Addendum: Just met with younger son [name] and Mrs.Molner at bedside, again strongly recommended Stopping all treatments and stopping HD and futility of care at this time and the need to focus on comfort only. Greg [name] doesn't want to see his father suffer much longer but at the same time not willing to make any decisions, Dr.Schertz plans to d/w extended family again today  Domenic Polite, MD [phone]

## 2014-12-14 NOTE — Progress Notes (Signed)
Hypoglycemic Event  CBG: 69  Treatment: D50 IV 25 mL  Symptoms: None  Follow-up CBG: Time: 0545 CBG Result: 120  Possible Reasons for Event: Inadequate meal intake  Comments/MD notified:    Jola Schmidt

## 2014-12-14 NOTE — Progress Notes (Addendum)
Urine Culture Sensitivities - FYI           3d ago    Specimen Description URINE, CATHETERIZED   Special Requests NONE   Culture >=100,000 COLONIES/mL ESCHERICHIA COLI  Confirmed Extended Spectrum Beta-Lactamase Producer (ESBL)       Report Status 12/14/2014 FINAL   Organism ID, Bacteria ESCHERICHIA COLI   Resulting Agency [facility]    Culture & Susceptibility      ESCHERICHIA COLI     Antibiotic Sensitivity Microscan Status    AMPICILLIN Resistant >=32 RESISTANT Final    Method: MIC    AMPICILLIN/SULBACTAM Resistant >=32 RESISTANT Final    Method: MIC    CEFAZOLIN Resistant >=64 RESISTANT Final    Method: MIC    CEFTRIAXONE Resistant >=64 RESISTANT Final    Method: MIC    CIPROFLOXACIN Resistant >=4 RESISTANT Final    Method: MIC    GENTAMICIN Resistant >=16 RESISTANT Final    Method: MIC    IMIPENEM Sensitive <=0.25 SENSITIVE Final    Method: MIC    NITROFURANTOIN Sensitive <=16 SENSITIVE Final    Method: MIC    PIP/TAZO Intermediate 64 INTERMEDIATE Final    Method: MIC    TRIMETH/SULFA Resistant >=320 RESISTANT Final    Method: MIC    Comments ESCHERICHIA COLI (MIC)    >=100,000 COLONIES/mL ESCHERICHIA COLI             Text-paged Dr. Jomarie Longs regarding antibiotics.  Progression of aggressive care vs. comfort care under discussion, suggest changing to Imipenem if plan is to continue with antibiotics.  [name], PharmD., MS Clinical Pharmacist Pager:  [phone] Thank you for allowing pharmacy to be part of this patients care team.

## 2014-12-14 NOTE — Progress Notes (Signed)
New Admission Note:  Transfer from 2c 14  Arrival Method: stretcher Mental Orientation: alert x1 Telemetry: yes Assessment: Completed Skin: dry, intact  IV: Right SL wrist, RFA SL Pain: none Tubes: n/a Safety Measures: Safety Fall Prevention Plan has been given, discussed and signed Admission: Completed Unit Orientation: Patient has been orientated to the room, unit and staff.  Family: no family present upon arrival  Orders have been reviewed and implemented. Will continue to monitor the patient. Call light has been placed within reach and bed alarm has been activated.   Janeann Forehand BSN, RN

## 2014-12-14 NOTE — Progress Notes (Addendum)
[facility] PROGRESS NOTE  Greg [name] VXY:[id number] DOB: [birth year] DOA: 12/10/2014 PCP: Rinaldo Cloud, MD      Assessment/Plan: GLOBAL: Overall very poor prognosis, due to CVA with hemiplegia/ bedbound /decubitus wounds, encephalopathy/possible dementia , Hep C/cirrhosis, severe cardiomyopathy/NSTEMI/ EF of 15% on last stress test 10/16, ESRD, severe malnutrition, peripheral arterial disease and dry gangrene of toe and current ongoing sepsis. I called and discussed with son Karie Mainland 12/3 and strongly recommended the need to focus on comfort measures only and stop everything including dialysis  Son requested HD yesterday and would d/w rest of family Discussed with renal Dr. Arlean Hopping who is agreeable  -Palliative consult for Goals fo care requested again, d/w Dr.Anwar  1. Sepsis -due to [facility] UTI, having purulent urine  -s/p 3days of Empiric Vanc and Zosyn, now Zosyn only -Urine cx with EColi, prior ESBL Ecoli UTI sensitive to Zosyn, continue Zosyn, pending sensitivities -Blood Cx with 1/2 Coag negative staph c/w contamination, STopped Vanc  12/2 -will not pursue CT abd pelvis to evaluate bladder/prostate, h/o prostatitic abscess, not candidate for surgery anyway-seen by Urology last admit  2. Toxic metabolic encephalopathy -due to Sepsis, also has some cognitive dysfunction at baseline due to CVA/?Mild Dementia -mild improvement in mentation, per RN was speaks more to family members   2. ESRD on HD TTS -renal following  3. H/o CVA with L hemiplegia/aphasia/Bed bound/decub wounds -continue ASA/plavix/statin  4. DM -stable, on SSI  5. Elevated Troponin -due to demand/Sepsis, ESRD, no chest pain  6. Hypercalcemia despite secondary hyperparathyroidism -on sensipar, per Renal-adjusted HD bath  7. CAD/Severe Cardiomyopathy -NSTEMI in October, EF is 15% -not a candidate for ACID per Cards in past admission  8. PAD with Gangrene of fifth toe -not a surgical candidate  9.  Hep C and Cirrhosis  10. Severe malnutrition  DVT proph: Hep SQ stopped due to drop in plts  Code Status: DNR,  d/w son Rolland Bredahl 12/1 and again  12/3 Family Communication: none at bedside, called and d/w son yesterday in detail regarding poor prognosis Disposition Plan: inpatient, Tx to floor/6E   Consultants:  Renal  HPI/Subjective: Mentation improving per RN, answers more questions, still somnolent  Objective: Filed Vitals:   12/14/14 0500 12/14/14 0700  BP: 117/63 133/65  Pulse: 76 82  Temp:    Resp: 19 17    Intake/Output Summary (Last 24 hours) at 12/14/14 0806 Last data filed at 12/14/14 0529  Gross per 24 hour  Intake    160 ml  Output   -800 ml  Net    960 ml   Filed Weights   12/13/14 2250 12/14/14 0310 12/14/14 0500  Weight: 62.8 kg (138 lb 7.2 oz) 63.6 kg (140 lb 3.4 oz) 63.2 kg (139 lb 5.3 oz)    Exam:   General:  Somnolent, arousible, less confused  Cardiovascular: S1S2/RRR  Respiratory: ronchi at bases  Abdomen: soft, NT, BS present  Musculoskeletal: LUE and LLE contracted, dry gangrene of 5th toe L foot  Neuro: obtunded, L hemiplegia   Data Reviewed: Basic Metabolic Panel:  Recent Labs Lab 12/10/14 2232 12/11/14 0515 12/12/14 0417 12/13/14 0620  NA 137 139 138 137  K 3.8 4.1 3.3* 3.3*  CL 101 102 99* 100*  CO2 26 26 28 26   GLUCOSE 159* 126* 78 120*  BUN 29* 31* 18 24*  CREATININE 3.71* 3.88* 2.49* 3.07*  CALCIUM 12.2* 12.3* 11.4* 10.9*   Liver Function Tests:  Recent Labs Lab 12/10/14 2232  AST 24  ALT 10*  ALKPHOS 119  BILITOT 0.7  PROT 6.6  ALBUMIN 1.4*   No results for input(s): LIPASE, AMYLASE in the last 168 hours. No results for input(s): AMMONIA in the last 168 hours. CBC:  Recent Labs Lab 12/10/14 1845 12/11/14 0515 12/12/14 0417 12/13/14 0620  WBC 18.8* 18.4* 11.0* 9.8  NEUTROABS 16.0*  --   --   --   HGB 9.6* 9.6* 8.1* 8.3*  HCT 31.1* 31.2* 26.7* 27.6*  MCV 85.9 86.4 85.3 84.9  PLT 125* 132*  85* 75*   Cardiac Enzymes:  Recent Labs Lab 12/11/14 0109 12/11/14 0515 12/11/14 1155  TROPONINI 0.30* 0.84* 0.24*   BNP (last 3 results) No results for input(s): BNP in the last 8760 hours.  ProBNP (last 3 results) No results for input(s): PROBNP in the last 8760 hours.  CBG:  Recent Labs Lab 12/13/14 1324 12/13/14 1614 12/13/14 2027 12/14/14 0505 12/14/14 0548  GLUCAP 142* 95 128* 69 120*    Recent Results (from the past 240 hour(s))  Blood Culture (routine x 2)     Status: None   Collection Time: 12/10/14  6:34 PM  Result Value Ref Range Status   Specimen Description BLOOD RIGHT WRIST  Final   Special Requests BOTTLES DRAWN AEROBIC AND ANAEROBIC  Final   Culture  Setup Time   Final    GRAM POSITIVE COCCI IN CLUSTERS IN BOTH AEROBIC AND ANAEROBIC BOTTLES CRITICAL RESULT CALLED TO, READ BACK BY AND VERIFIED WITH: B REAP RN 1933 12/11/14 A BROWNING    Culture   Final    STAPHYLOCOCCUS SPECIES (COAGULASE NEGATIVE) THE SIGNIFICANCE OF ISOLATING THIS ORGANISM FROM A SINGLE SET OF BLOOD CULTURES WHEN MULTIPLE SETS ARE DRAWN IS UNCERTAIN. PLEASE NOTIFY THE MICROBIOLOGY DEPARTMENT WITHIN ONE WEEK IF SPECIATION AND SENSITIVITIES ARE REQUIRED.    Report Status 12/13/2014 FINAL  Final  Blood Culture (routine x 2)     Status: None (Preliminary result)   Collection Time: 12/10/14  6:48 PM  Result Value Ref Range Status   Specimen Description BLOOD RIGHT ARM  Final   Special Requests BOTTLES DRAWN AEROBIC AND ANAEROBIC 5CC  Final   Culture NO GROWTH 3 DAYS  Final   Report Status PENDING  Incomplete  Urine culture     Status: None (Preliminary result)   Collection Time: 12/11/14  4:21 AM  Result Value Ref Range Status   Specimen Description URINE, CATHETERIZED  Final   Special Requests NONE  Final   Culture   Final    >=100,000 COLONIES/mL ESCHERICHIA COLI REPEATING SENSITIVITIES    Report Status PENDING  Incomplete  MRSA PCR Screening     Status: None    Collection Time: 12/11/14  9:09 PM  Result Value Ref Range Status   MRSA by PCR NEGATIVE NEGATIVE Final    Comment:        The GeneXpert MRSA Assay (FDA approved for NASAL specimens only), is one component of a comprehensive MRSA colonization surveillance program. It is not intended to diagnose MRSA infection nor to guide or monitor treatment for MRSA infections.      Studies: No results found.  Scheduled Meds: . aspirin EC  325 mg Oral Daily  . atorvastatin  40 mg Oral Daily  . azithromycin  500 mg Intravenous Q24H  . carvedilol  10 mg Oral Daily  . clopidogrel  75 mg Oral QODAY  . dextrose      . feeding supplement  1 Container Oral Q24H  . feeding  supplement (NEPRO CARB STEADY)  237 mL Oral BID BM  . [START ON 12/18/2014] ferric gluconate (FERRLECIT/NULECIT) IV  62.5 mg Intravenous Q Thu-HD  . gabapentin  100 mg Oral Daily  . heparin  2,000 Units Dialysis Once in dialysis  . insulin aspart  0-9 Units Subcutaneous 6 times per day  . multivitamin  1 tablet Oral QHS  . pantoprazole  40 mg Oral BID  . piperacillin-tazobactam (ZOSYN)  IV  2.25 g Intravenous 3 times per day   Continuous Infusions:   Antibiotics Given (last 72 hours)    Date/Time Action Medication Dose Rate   12/12/14 2301 Given   azithromycin (ZITHROMAX) 500 mg in dextrose 5 % 250 mL IVPB 500 mg 250 mL/hr   12/13/14 1902 Given   azithromycin (ZITHROMAX) 500 mg in dextrose 5 % 250 mL IVPB 500 mg 250 mL/hr      Principal Problem:   HCAP (healthcare-associated pneumonia) Active Problems:   DM type 2 causing ESRD (HCC)   Sepsis (HCC)   ESRD on hemodialysis (HCC)   Hypercalcemia   Pressure ulcer    Time spent:    [facility]  [facility] Pager [phone]. If 7PM-7AM, please contact night-coverage at [email or web address], password [facility] 12/14/2014, 8:06 AM  LOS: 4 days

## 2014-12-14 NOTE — Progress Notes (Signed)
Family meeting held this evening.  We discussed the patient's debility and progressive decline. Family understands that patient is very ill.  They would like not to withdraw dialysis just yet but to take him home for about 2-3 weeks with continued outpatient dialysis, possibly shortened times, and then plan withdrawal of dialysis in the outpatient setting and they would prefer to have hospice in the home.  Questions answered. I told them we will try to get him discharged home tomorrow and it would probably be good to get them set up with palliative care or hospice f/u in the outpatient setting upon dc.  Will d/w primary MD.   Vinson Moselle MD [facility] pager [phone]    cell [phone] 12/14/2014, 6:30 PM

## 2014-12-15 DIAGNOSIS — A419 Sepsis, unspecified organism: Secondary | ICD-10-CM

## 2014-12-15 LAB — GLUCOSE, CAPILLARY
GLUCOSE-CAPILLARY: 108 mg/dL — AB (ref 65–99)
GLUCOSE-CAPILLARY: 94 mg/dL (ref 65–99)
GLUCOSE-CAPILLARY: 98 mg/dL (ref 65–99)

## 2014-12-15 LAB — BASIC METABOLIC PANEL
Anion gap: 11 (ref 5–15)
BUN: 15 mg/dL (ref 6–20)
CHLORIDE: 103 mmol/L (ref 101–111)
CO2: 22 mmol/L (ref 22–32)
Calcium: 10.1 mg/dL (ref 8.9–10.3)
Creatinine, Ser: 2.41 mg/dL — ABNORMAL HIGH (ref 0.61–1.24)
GFR calc Af Amer: 30 mL/min — ABNORMAL LOW (ref >60)
GFR calc non Af Amer: 26 mL/min — ABNORMAL LOW (ref >60)
GLUCOSE: 88 mg/dL (ref 65–99)
POTASSIUM: 5.8 mmol/L — AB (ref 3.5–5.1)
Sodium: 136 mmol/L (ref 135–145)

## 2014-12-15 LAB — CBC
HEMATOCRIT: 31.9 % — AB (ref 39.0–52.0)
Hemoglobin: 9.8 g/dL — ABNORMAL LOW (ref 13.0–17.0)
MCH: 26.2 pg (ref 26.0–34.0)
MCHC: 30.7 g/dL (ref 30.0–36.0)
MCV: 85.3 fL (ref 78.0–100.0)
PLATELETS: 63 10*3/uL — AB (ref 150–400)
RBC: 3.74 MIL/uL — ABNORMAL LOW (ref 4.22–5.81)
RDW: 19.2 % — AB (ref 11.5–15.5)
WBC: 10.1 10*3/uL (ref 4.0–10.5)

## 2014-12-15 LAB — CULTURE, BLOOD (ROUTINE X 2): CULTURE: NO GROWTH

## 2014-12-15 MED ORDER — FOSFOMYCIN TROMETHAMINE 3 G PO PACK: 3.0000 g | PACK | ORAL | Status: AC

## 2014-12-15 MED ORDER — FOSFOMYCIN TROMETHAMINE 3 G PO PACK
3.0000 g | PACK | ORAL | Status: DC
  Filled 2014-12-15: qty 3

## 2014-12-15 NOTE — Progress Notes (Signed)
Greg [name] to be D/C'd Home per MD order.  Discussed prescriptions and follow up appointments with the patient. Prescriptions given to patient, medication list explained in detail. Pt verbalized understanding.    Medication List    STOP taking these medications        lisinopril 5 MG tablet  Commonly known as:  PRINIVIL,ZESTRIL     NOVOLIN 70/30 (70-30) 100 UNIT/ML injection  Generic drug:  insulin NPH-regular Human      TAKE these medications        acetaminophen 325 MG tablet  Commonly known as:  TYLENOL  Take 2 tablets (650 mg total) by mouth every 6 (six) hours as needed for mild pain, moderate pain, fever or headache (or Fever >/= 101).     aspirin EC 325 MG tablet  Take 325 mg by mouth daily.     atorvastatin 40 MG tablet  Commonly known as:  LIPITOR  Take 40 mg by mouth daily.     carvedilol 10 MG 24 hr capsule  Commonly known as:  COREG CR  Take 1 capsule (10 mg total) by mouth daily.     cinacalcet 30 MG tablet  Commonly known as:  SENSIPAR  Take 6 tablets (180 mg total) by mouth daily with supper.     clopidogrel 75 MG tablet  Commonly known as:  PLAVIX  Take 1 tablet (75 mg total) by mouth every other day.     feeding supplement Liqd  Take 1 Container by mouth 3 (three) times daily between meals.     fosfomycin 3 G Pack  Commonly known as:  MONUROL  Take 3 g by mouth every other day.     gabapentin 100 MG capsule  Commonly known as:  NEURONTIN  Take 100 mg by mouth daily.     lanthanum 1000 MG chewable tablet  Commonly known as:  FOSRENOL  Chew 1 tablet (1,000 mg total) by mouth 3 (three) times daily with meals.     multivitamin Tabs tablet  Take 1 tablet by mouth at bedtime.     oxyCODONE-acetaminophen 5-325 MG tablet  Commonly known as:  PERCOCET/ROXICET  Take 1 tablet by mouth every 8 (eight) hours as needed for severe pain.     pantoprazole 40 MG tablet  Commonly known as:  PROTONIX  Take 1 tablet (40 mg total) by mouth 2 (two) times  daily.        Filed Vitals:   12/14/14 2103 12/15/14 0946  BP: 111/49 119/43  Pulse: 98 66  Temp: 98.8 F (37.1 C) 98.5 F (36.9 C)  Resp: 14 16    Skin clean, dry and intact without evidence of skin break down, no evidence of skin tears noted. IV catheter discontinued intact. Site without signs and symptoms of complications. Dressing and pressure applied. Pt denies pain at this time. No complaints noted.  An After Visit Summary was printed and given to the patient. Patient escorted stretcher, and D/C home via ambulance.  Janeann Forehand BSN, RN

## 2014-12-15 NOTE — Progress Notes (Signed)
Patient appears lethargic and is difficult to wake. I was unable to give patient his 1000 medicines, the was unable to tolerate them by mouth.

## 2014-12-15 NOTE — Progress Notes (Signed)
Subjective:  Not voiced responses /opens eyes / no family in Room /Palliative care Pending  Objective Vital signs in last 24 hours: Filed Vitals:   12/14/14 1500 12/14/14 1600 12/14/14 1651 12/14/14 2103  BP: 134/59 133/66 137/61 111/49  Pulse: 77 76 78 98  Temp:   98.1 F (36.7 C) 98.8 F (37.1 C)  TempSrc:   Oral Axillary  Resp: Height:      Weight:      SpO2: 100% 100% 100% 99%   Weight change:   Physical Exam: General: somnolent, chronically ill, opens eyes to voice Heart: RRR, no rub,gallop or mur appreciated Lungs: Poor resp. Effort ,rare rhonchi , non labored  breathing Abdomen:  Soft , NT, ND Extremities: bi pedal ankle edema/ L dry gangrene 5th toe  Dialysis Access:  Pos bruit L UA AVF    OP HD= [facility] TTS 4h 59.5kg 2/3.5 bath Hep 1900 LUA AVF Hect 6 ug Mircera 200 q2wks last 11/25, due 12/8 Venofer 50/wk Other op labs hgb 9.3/ ca 7.9 Phos 4.3 171 pth   Problem/Plan: 1. HCAP- Admit team rx/ on zosyn  100% o2sat 2. AMS- acute on chronic with  ho cva and HCAP this admit 3. ESRD - HD TTS [facility] /next HD would be tomor if Family wishes to conitn HD /pall.Care consult pend today see 9 4. HTN/volume -  5. Anemia - hgb 8.3 on dec 3, pend this am /esa q Thursday hd due next week weekly fe on hd 6. MBD - ADMIT  ^^Ca 12.2  Corec ca = 14.2  ,no vit d / no binders on sensipar/ this am 10.1  corec CA 12.1 low Ca bath w HD 7. DM type 2 -per admit 8. DNR 9. EOL - patient with Progressive Decline over past months and this admit not much improvement in  Mentation/ extremely debilitated and a poor candidate for continued dialysis. Noted Dr. Arlean Hopping and Dr. Jomarie Longs  discussion with family yesterday for recommending  transition towards comfort care. Palliative  Care consult pending     Greg Pastel, PA-C [facility] [phone] 12/15/2014,8:33 AM  LOS: 5 days   Labs: Basic Metabolic Panel:  Recent Labs Lab 12/12/14 0417  12/13/14 0620 12/15/14 0622  NA 138 137 136  K 3.3* 3.3* 5.8*  CL 99* 100* 103  CO2 GLUCOSE 78 120* 88  BUN 18 24* 15  CREATININE 2.49* 3.07* 2.41*  CALCIUM 11.4* 10.9* 10.1   Liver Function Tests:  Recent Labs Lab 12/10/14 2232  AST 24  ALT 10*  ALKPHOS 119  BILITOT 0.7  PROT 6.6  ALBUMIN 1.4*   No results for input(s): LIPASE, AMYLASE in the last 168 hours. No results for input(s): AMMONIA in the last 168 hours. CBC:  Recent Labs Lab 12/10/14 1845 12/11/14 0515 12/12/14 0417 12/13/14 0620  WBC 18.8* 18.4* 11.0* 9.8  NEUTROABS 16.0*  --   --   --   HGB 9.6* 9.6* 8.1* 8.3*  HCT 31.1* 31.2* 26.7* 27.6*  MCV 85.9 86.4 85.3 84.9  PLT 125* 132* 85* 75*   Cardiac Enzymes:  Recent Labs Lab 12/11/14 0109 12/11/14 0515 12/11/14 1155  TROPONINI 0.30* 0.84* 0.24*   CBG:  Recent Labs Lab 12/14/14 1649 12/14/14 2047 12/15/14 0002 12/15/14 0432 12/15/14 0748  GLUCAP 112* 114* 108* 98 94    Studies/Results: No results found. Medications:   . aspirin EC  325 mg Oral Daily  . atorvastatin  40 mg Oral Daily  . carvedilol  10 mg Oral Daily  . cinacalcet  180 mg Oral Q supper  . clopidogrel  75 mg Oral QODAY  . feeding supplement  1 Container Oral Q24H  . feeding supplement (NEPRO CARB STEADY)  237 mL Oral BID BM  . [START ON 12/18/2014] ferric gluconate (FERRLECIT/NULECIT) IV  62.5 mg Intravenous Q Thu-HD  . gabapentin  100 mg Oral Daily  . heparin  2,000 Units Dialysis Once in dialysis  . insulin aspart  0-9 Units Subcutaneous 6 times per day  . multivitamin  1 tablet Oral QHS  . pantoprazole  40 mg Oral BID  . piperacillin-tazobactam (ZOSYN)  IV  2.25 g Intravenous 3 times per day

## 2014-12-15 NOTE — Clinical Documentation Improvement (Signed)
Hospitalist  Can the diagnosis of pressure ulcer be further specified?   Document if pressure ulcer with stage is Present on Admission   Document Site with laterality - Elbow, Back (upper/lower), Sacral, Hip, Buttock, Ankle, Heel, Head, Other (Specify)  Pressure Ulcer Stage - Stage1, Stage 2, Stage 3, Stage 4, Unstageable, Unspecified, Unable to Clinically Determine  Document any associated diagnoses/conditions such as: with gangrene  Other  Clinically Undetermined    Supporting Information: Pressure ulcer noted per 12/02 progress notes. Decubitus wounds noted per 12/04 progress notes.   Please exercise your independent, professional judgment when responding. A specific answer is not anticipated or expected.   Thank Sabino Donovan Health Information Management [place] [phone]

## 2014-12-15 NOTE — Clinical Social Work Note (Signed)
CSW received referral from [facility] stating patient requiring EMS transportation home at time of discharge. CSW spoke with patient's son to confirm correct address for EMS transportation.  [address] [place], [place] [address]  EMS transport documentation completed and placed on patient's chart.  If patient ready for discharge after 4:30pm, RN to call to arrange EMS transportation. [facility] (before 5pm) [phone] [facility] (after 5pm) [phone]  Marcelline Deist, LCSW [phone] Orthopedics: [phone] Surgical: [phone]

## 2014-12-15 NOTE — Care Management Note (Signed)
Case Management Note  Patient Details  Name: Greg [name] MRN: [id number] Date of Birth: 1945/12/05  Subjective/Objective:          CM following for progression and d/c planning.          Action/Plan: 12/15/2014 Met with pt son, Deatra Canter and family has decided to take this pt home with home hospice services to be arranged by PCP after d/c. This CM offered to resume [facility] services until home hospice is arranged. The son ask that this be done , then [facility] will be d/c once hospice is in place. [facility] aware of plan and will resume HHRN services. Family has requested an ambulance for transport to home. CSW, Raquel Sarna , notified and will speak with pt RN , Lurena Joiner to verify address.    Expected Discharge Date:       12/15/2014           Expected Discharge Plan:  [place]  In-House Referral:  NA  Discharge planning Services  CM Consult  Post Acute Care Choice:  NA Choice offered to:  NA  DME Arranged:  N/A DME Agency:  NA  HH Arranged:  RN [place] Agency:  [place]  Status of Service:  Completed, signed off  Medicare Important Message Given:  Yes Date Medicare IM Given:    Medicare IM give by:    Date Additional Medicare IM Given:    Additional Medicare Important Message give by:     If discussed at [place] of Stay Meetings, dates discussed:    Additional Comments:  Adron Bene, RN 12/15/2014, 3:10 PM

## 2014-12-15 NOTE — Discharge Summary (Signed)
Physician Discharge Summary  Major Santerre ZOX:[id number] DOB: [birth year] DOA: 12/10/2014  PCP: Rinaldo Cloud, MD  Admit date: 12/10/2014 Discharge date: 12/15/2014  Time spent: 45 minutes  Recommendations for Outpatient Follow-up:  1. Pt is actively dying, family wishes to take him home 2. PCP Dr.Harwani or Renal MD at [facility] to initiate Hospice services at home when requested by Family 3. Patient's family wanted to continue hemodialysis at this time despite near terminal status, at [facility] they understand that his dialysis facility may refuse hemodialysis and are okay with this. They will  initiate hospice services at home within 2 weeks or sooner if unable to dialyze. 4. 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 Patient brought in by family with worsening AMS for 1 day and several day history of cough and congestion. Family also notes foul smelling urine and a h/o UTIs including most recently with an ESBL ecoli that was sensitive only to imipenem and zosyn a couple of months back. Fever at home of up to 103 per family   Hospital Course:  GLOBAL: Actively Dying and Overall very  poor prognosis, due to CVA with hemiplegia/ bedbound /decubitus wounds, Worsening encephalopathy/possible dementia , Hep C/cirrhosis, severe cardiomyopathy/NSTEMI/ EF of 15% on last stress test 10/16, ESRD, severe malnutrition, peripheral arterial disease and dry gangrene of toe and current ongoing sepsis. I called and discussed with son Karie Mainland and [id number] on multiple occasions   and strongly recommended the need to focus on comfort measures only and stop everything including dialysis. -Dr. Arlean Hopping has told the family numerous times that he is to sick and weak to continue hemodialysis, family understands that the patient is dying, they wish to take him home, since they want him to die at home. -Depending on his clinical status they want to try dialysis in the outpatient setting for 1-2 weeks if possible until his daughter from out of state arrives . They understand that there is high likelihood that his Dialysis Facility may not think that he is stable for Hemodialysis and refuse this. At that time they do not wish to bring  him back to the hospital rather keep him home and initiate Hospice services  1. Sepsis -due to [facility] UTI, having purulent urine  -s/p 3days of Empiric Vanc and Zosyn, then  Zosyn only x5days -Prior Urine cx with EColi,ESBL Ecoli UTI sensitive to Zosyn -Urine Cx now ESBL ECOli but intermediate to Zosyn, since above plan made, after discussion with PhramD, Abx changed to oral Fosfomycin at discharge today -Blood Cx with 1/2 Coag  negative staph c/w contamination, STopped Vanc 12/2 -will not pursue CT abd pelvis to evaluate bladder/prostate, h/o prostatitic abscess, not candidate for surgery anyway-seen by Urology last admission  2. Toxic metabolic encephalopathy -due to Sepsis, also has cognitive dysfunction at baseline due to CVA/Mild Dementia -without much changes in mentationthe this admission  2. ESRD on HD TTS -renal following -see above discussion regarding HD  3. H/o CVA  with L hemiplegia/aphasia/Bed bound/decub wounds -continue ASA/plavix/statin  4. DM -stable, on SSI  5. Elevated Troponin -due to demand/Sepsis, ESRD, no chest pain  6. Hypercalcemia despite secondary hyperparathyroidism -on sensipar, per Renal-adjusted HD bath  7. CAD/Severe Cardiomyopathy -NSTEMI in October, EF is 15% -not a candidate for ACID per Cards in past admission  8. PAD with Gangrene of fifth toe -not a surgical candidate  9. Hep C and Cirrhosis  10. Severe malnutrition  Consultations:  Renal  Discharge Exam: Filed Vitals:   12/14/14 2103 12/15/14 0946  BP: 111/49 119/43  Pulse: 98 66  Temp: 98.8 F (37.1 C) 98.5 F (36.9 C)  Resp: 14 16    General: obtunded, arouses to pain, moans, very cachectic Cardiovascular:S1S2/RRR Respiratory: Diminished BS at bases  Discharge Instructions   Discharge Instructions    Discharge instructions    Complete by:  As directed   Renal Diet     Increase activity slowly    Complete by:  As directed           Current Discharge Medication List    START taking these medications   Details  fosfomycin (MONUROL) 3 G PACK Take 3 g by mouth every other day. Qty: 9 g, Refills: 0      CONTINUE these medications which have NOT CHANGED   Details  acetaminophen (TYLENOL) 325 MG tablet Take 2 tablets (650 mg total) by mouth every 6 (six) hours as needed for mild pain, moderate pain, fever or headache (or Fever >/= 101).    aspirin EC 325 MG tablet Take 325 mg by mouth daily.    atorvastatin (LIPITOR) 40 MG tablet Take 40 mg by mouth daily.     carvedilol (COREG CR) 10 MG 24 hr capsule Take 1 capsule (10 mg total) by mouth daily. Qty: 30 capsule, Refills: 0    cinacalcet (SENSIPAR) 30 MG tablet Take 6 tablets (180 mg total) by mouth daily with supper. Qty: 60 tablet, Refills: 0    clopidogrel (PLAVIX) 75 MG tablet Take 1 tablet (75 mg total) by mouth every other day. Qty: 30 tablet, Refills: 0    feeding  supplement (BOOST / RESOURCE BREEZE) LIQD Take 1 Container by mouth 3 (three) times daily between meals.    gabapentin (NEURONTIN) 100 MG capsule Take 100 mg by mouth daily.     lanthanum (FOSRENOL) 1000 MG chewable tablet Chew 1 tablet (1,000 mg total) by mouth 3 (three) times daily with meals. Qty: 90 tablet, Refills: 0    multivitamin (RENA-VIT) TABS tablet Take 1 tablet by mouth at bedtime. Qty: 30 tablet, Refills: 0    oxyCODONE-acetaminophen (PERCOCET/ROXICET) 5-325 MG tablet Take 1 tablet by mouth every 8 (eight) hours as needed for severe pain. Qty: 10 tablet, Refills: 0    pantoprazole (PROTONIX) 40 MG tablet Take 1 tablet (40 mg total) by mouth 2 (two) times daily. Qty: 60 tablet, Refills: 0      STOP taking these medications     lisinopril (PRINIVIL,ZESTRIL) 5 MG tablet      NOVOLIN 70/30 (70-30) 100 UNIT/ML injection  No Known Allergies    The results of significant diagnostics from this hospitalization (including imaging, microbiology, ancillary and laboratory) are listed below for reference.    Significant Diagnostic Studies: Dg Chest Portable 1 View  12/10/2014  CLINICAL DATA:  Cough. End-stage renal disease on dialysis. Cirrhosis. EXAM: PORTABLE CHEST 1 VIEW COMPARISON:  10/15/2014 FINDINGS: Patient is partially rotated to the right. Ill-defined airspace opacity is seen in the peripheral right lower lung. Left lung remains clear. Heart size is stable allowing for differences in patient positioning. No pneumothorax or pleural effusion visualized. IMPRESSION: Suboptimal due to positioning. Peripheral right lower lung airspace opacity, suspicious for infiltrate/pneumonia. Electronically Signed   By: Myles Rosenthal M.D.   On: 12/10/2014 19:04    Microbiology: Recent Results (from the past 240 hour(s))  Blood Culture (routine x 2)     Status: None   Collection Time: 12/10/14  6:34 PM  Result Value Ref Range Status   Specimen Description BLOOD RIGHT WRIST  Final    Special Requests BOTTLES DRAWN AEROBIC AND ANAEROBIC  Final   Culture  Setup Time   Final    GRAM POSITIVE COCCI IN CLUSTERS IN BOTH AEROBIC AND ANAEROBIC BOTTLES CRITICAL RESULT CALLED TO, READ BACK BY AND VERIFIED WITH: B REAP RN 1933 12/11/14 A BROWNING    Culture   Final    STAPHYLOCOCCUS SPECIES (COAGULASE NEGATIVE) THE SIGNIFICANCE OF ISOLATING THIS ORGANISM FROM A SINGLE SET OF BLOOD CULTURES WHEN MULTIPLE SETS ARE DRAWN IS UNCERTAIN. PLEASE NOTIFY THE MICROBIOLOGY DEPARTMENT WITHIN ONE WEEK IF SPECIATION AND SENSITIVITIES ARE REQUIRED.    Report Status 12/13/2014 FINAL  Final  Blood Culture (routine x 2)     Status: None   Collection Time: 12/10/14  6:48 PM  Result Value Ref Range Status   Specimen Description BLOOD RIGHT ARM  Final   Special Requests BOTTLES DRAWN AEROBIC AND ANAEROBIC 5CC  Final   Culture NO GROWTH 5 DAYS  Final   Report Status 12/15/2014 FINAL  Final  Urine culture     Status: None   Collection Time: 12/11/14  4:21 AM  Result Value Ref Range Status   Specimen Description URINE, CATHETERIZED  Final   Special Requests NONE  Final   Culture   Final    >=100,000 COLONIES/mL ESCHERICHIA COLI Confirmed Extended Spectrum Beta-Lactamase Producer (ESBL)    Report Status 12/14/2014 FINAL  Final   Organism ID, Bacteria ESCHERICHIA COLI  Final      Susceptibility   Escherichia coli - MIC*    AMPICILLIN >=32 RESISTANT Resistant     CEFAZOLIN >=64 RESISTANT Resistant     CEFTRIAXONE >=64 RESISTANT Resistant     CIPROFLOXACIN >=4 RESISTANT Resistant     GENTAMICIN >=16 RESISTANT Resistant     IMIPENEM <=0.25 SENSITIVE Sensitive     NITROFURANTOIN <=16 SENSITIVE Sensitive     TRIMETH/SULFA >=320 RESISTANT Resistant     AMPICILLIN/SULBACTAM >=32 RESISTANT Resistant     PIP/TAZO 64 INTERMEDIATE Intermediate     * >=100,000 COLONIES/mL ESCHERICHIA COLI  MRSA PCR Screening     Status: None   Collection Time: 12/11/14  9:09 PM  Result Value Ref Range Status    MRSA by PCR NEGATIVE NEGATIVE Final    Comment:        The GeneXpert MRSA Assay (FDA approved for NASAL specimens only), is one component of a comprehensive MRSA colonization surveillance program. It is not intended to diagnose MRSA infection nor to guide or monitor treatment for MRSA infections.  Labs: Basic Metabolic Panel:  Recent Labs Lab 12/10/14 2232 12/11/14 0515 12/12/14 0417 12/13/14 0620 12/15/14 0622  NA 137 139 138 137 136  K 3.8 4.1 3.3* 3.3* 5.8*  CL 101 102 99* 100* 103  CO2 26 26 28 26 22   GLUCOSE 159* 126* 78 120* 88  BUN 29* 31* 18 24* 15  CREATININE 3.71* 3.88* 2.49* 3.07* 2.41*  CALCIUM 12.2* 12.3* 11.4* 10.9* 10.1   Liver Function Tests:  Recent Labs Lab 12/10/14 2232  AST 24  ALT 10*  ALKPHOS 119  BILITOT 0.7  PROT 6.6  ALBUMIN 1.4*   No results for input(s): LIPASE, AMYLASE in the last 168 hours. No results for input(s): AMMONIA in the last 168 hours. CBC:  Recent Labs Lab 12/10/14 1845 12/11/14 0515 12/12/14 0417 12/13/14 0620 12/15/14 0831  WBC 18.8* 18.4* 11.0* 9.8 10.1  NEUTROABS 16.0*  --   --   --   --   HGB 9.6* 9.6* 8.1* 8.3* 9.8*  HCT 31.1* 31.2* 26.7* 27.6* 31.9*  MCV 85.9 86.4 85.3 84.9 85.3  PLT 125* 132* 85* 75* 63*   Cardiac Enzymes:  Recent Labs Lab 12/11/14 0109 12/11/14 0515 12/11/14 1155  TROPONINI 0.30* 0.84* 0.24*   BNP: BNP (last 3 results) No results for input(s): BNP in the last 8760 hours.  ProBNP (last 3 results) No results for input(s): PROBNP in the last 8760 hours.  CBG:  Recent Labs Lab 12/14/14 1649 12/14/14 2047 12/15/14 0002 12/15/14 0432 12/15/14 0748  GLUCAP 112* 114* 108* 98 94       Signed:  Adaijah Endres  [facility] 12/15/2014, 1:46 PM

## 2014-12-15 NOTE — Care Management Important Message (Signed)
Important Message  Patient Details  Name: Greg [name] MRN: [id number] Date of Birth: [birth year]   Medicare Important Message Given:  Yes    Wacey Zieger, Annamarie Major, RN 12/15/2014, 10:48 AM

## 2015-01-11 DEATH — deceased

## 2015-11-25 IMAGING — CR DG CHEST 1V PORT
1 series · 1 of 1 positions shown · non-contrast
Comparison: Prior radiograph from earlier the same day.

CLINICAL DATA: Initial valuation for central line placement.

EXAM:
PORTABLE CHEST - 1 VIEW

[AP]
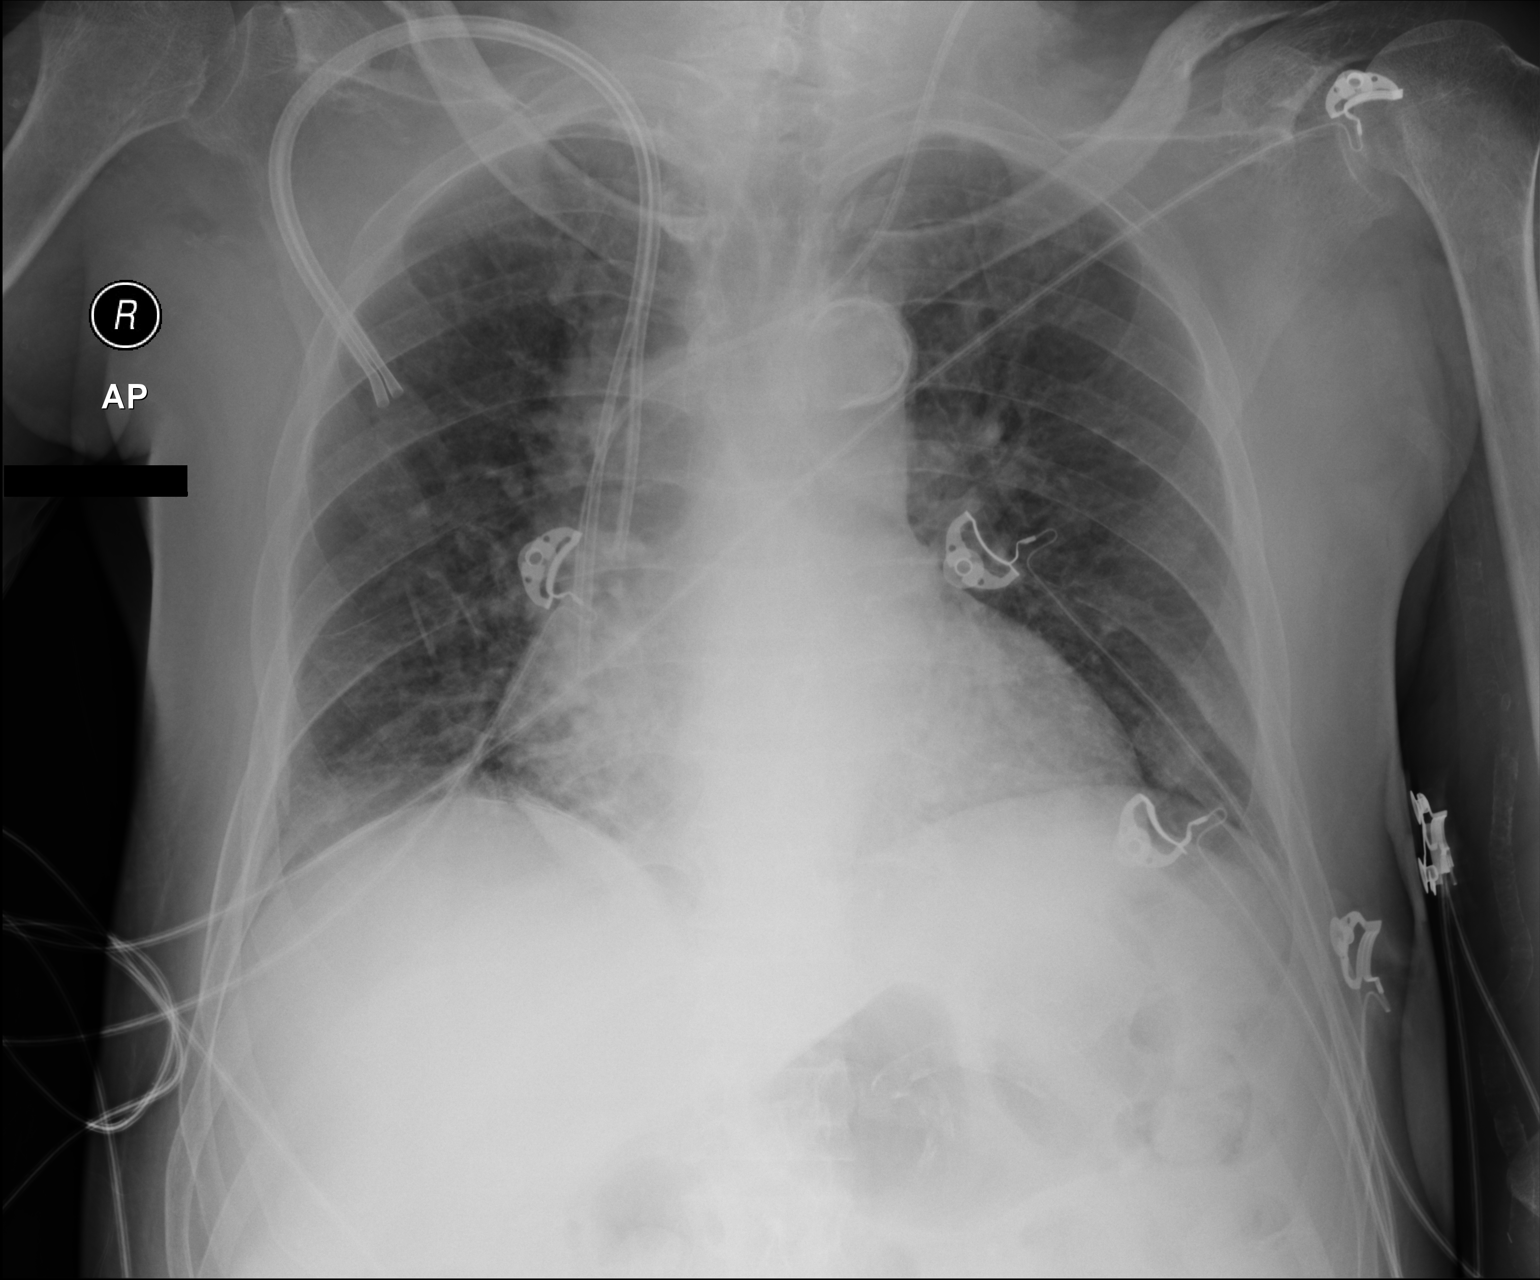

[1 of 1 positions shown; findings below may reference images not displayed]

FINDINGS: There has been interval placement of a left IJ approach central
venous catheter with tip overlying the cavoatrial junction.
Right-sided dialysis catheter is unchanged. Cardiomegaly stable.
Mediastinal silhouette within normal limits. Atheromatous plaque
within the aortic arch.

Lungs are normally inflated. Diffuse pulmonary vascular congestion
and indistinctness of the interstitial markings again seen,
suggesting mild edema. More patchy and linear right basilar opacity
may reflect atelectasis or superimposed infiltrate. No pneumothorax.
IMPRESSION: 1. Interval placement of left IJ approach central venous catheter
with tip overlying the cavoatrial junction.
2. Stable cardiomegaly with diffuse pulmonary vascular congestion
and indistinctness of the interstitial markings, suggesting mild
edema.
3. Superimposed mild patchy and linear right basilar opacity, which
may reflect atelectasis or possible infiltrate.

## 2015-11-25 IMAGING — CR DG CHEST 1V PORT
1 series · 1 of 1 positions shown · non-contrast
Comparison: One-view chest x-ray 08/04/2014.

CLINICAL DATA: Code sepsis.  No chest complaints.

EXAM:
PORTABLE CHEST - 1 VIEW

[AP]
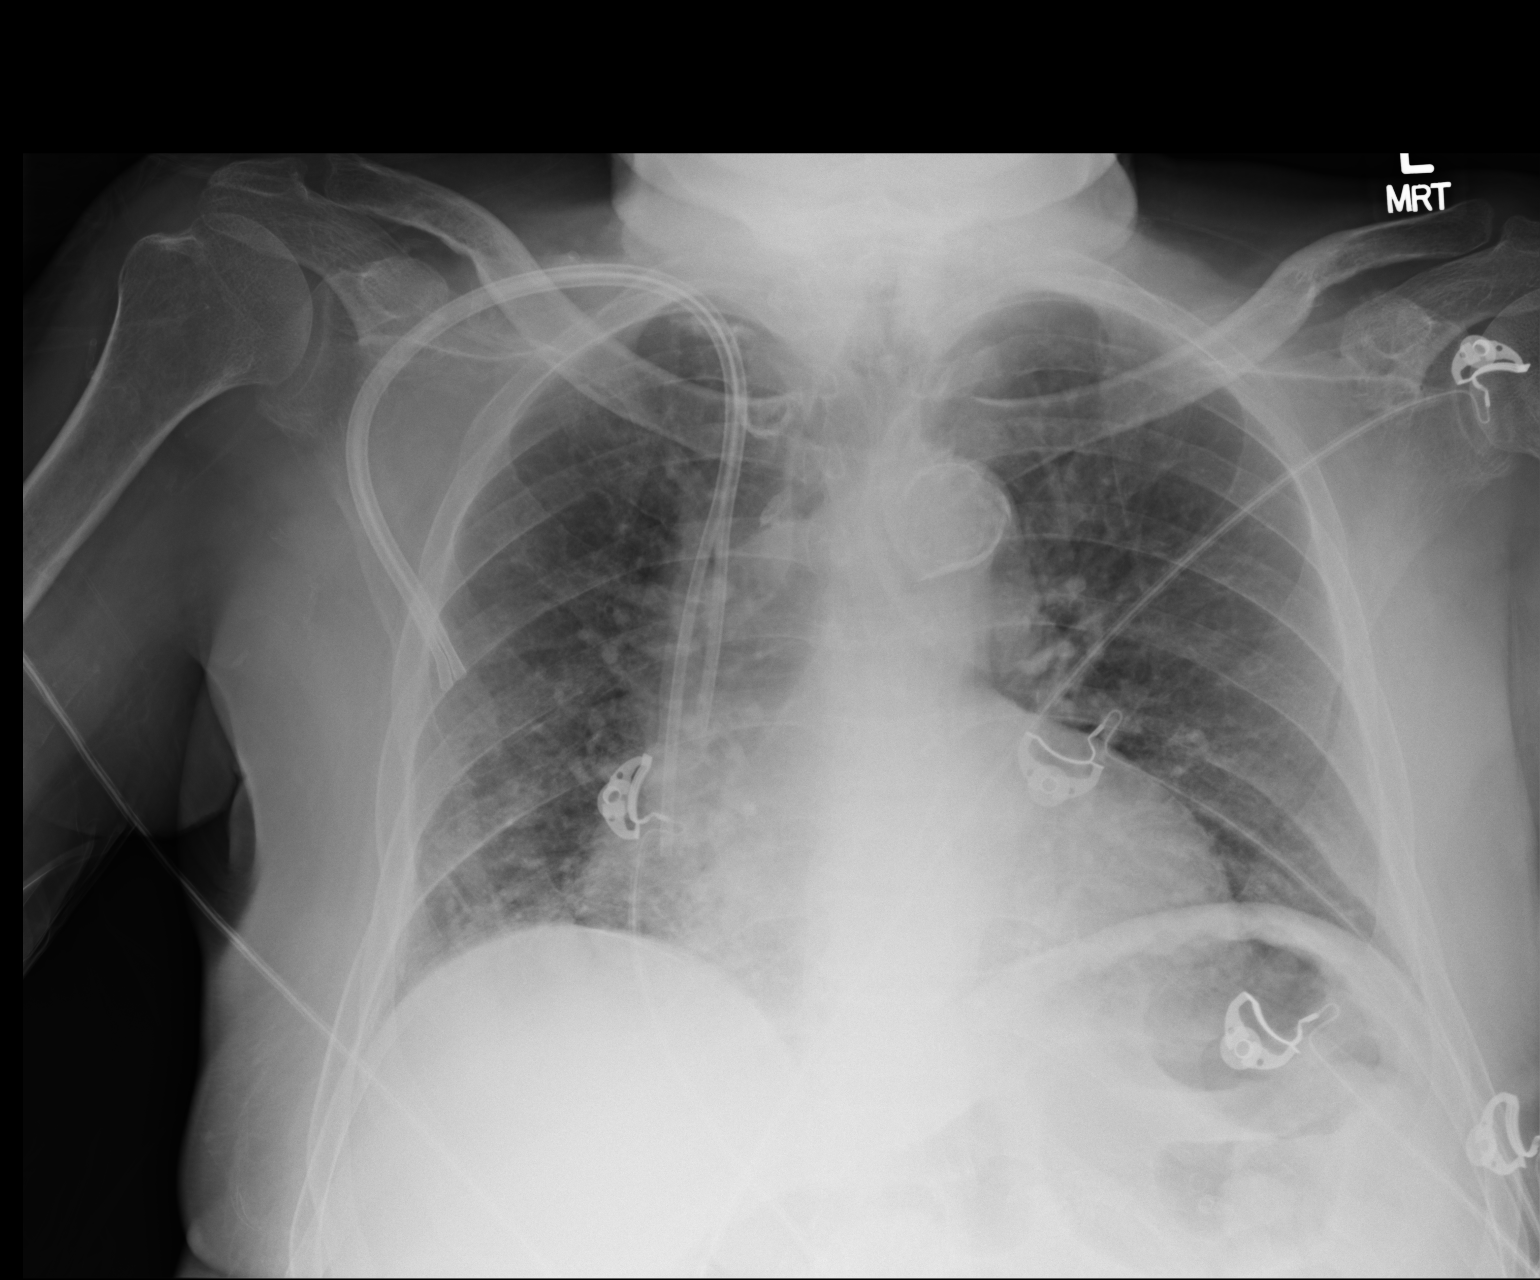

[1 of 1 positions shown; findings below may reference images not displayed]

FINDINGS: The heart is enlarged. A right IJ dialysis catheter is in place.
Atherosclerotic calcifications are present at the aortic arch.
Slight increased interstitial markings suggests mild edema. No focal
airspace consolidation is evident.
IMPRESSION: 1. Cardiomegaly and slight increased interstitial pattern compatible
with edema.
2. No focal airspace consolidation to suggest infection.
3. Right IJ Port-A-Cath is stable in position.
4. Atherosclerosis of the thoracic aorta.
